# Patient Record
Sex: Female | Born: 1990 | Race: Black or African American | Hispanic: No | Marital: Single | Smoking: Current some day smoker
Health system: Southern US, Community
[De-identification: ages and names within clinical notes are randomized; demographics above are authoritative.]

## PROBLEM LIST (undated history)

## (undated) ENCOUNTER — Inpatient Hospital Stay (HOSPITAL_COMMUNITY): Payer: Self-pay

## (undated) ENCOUNTER — Emergency Department (HOSPITAL_COMMUNITY): Admission: EM | Discharge status: Absent without leave | Payer: Self-pay

## (undated) DIAGNOSIS — T8859XA Other complications of anesthesia, initial encounter: Secondary | ICD-10-CM

## (undated) DIAGNOSIS — F329 Major depressive disorder, single episode, unspecified: Secondary | ICD-10-CM

## (undated) DIAGNOSIS — Z9889 Other specified postprocedural states: Secondary | ICD-10-CM

## (undated) DIAGNOSIS — N200 Calculus of kidney: Secondary | ICD-10-CM

## (undated) DIAGNOSIS — F32A Depression, unspecified: Secondary | ICD-10-CM

## (undated) DIAGNOSIS — K219 Gastro-esophageal reflux disease without esophagitis: Secondary | ICD-10-CM

## (undated) DIAGNOSIS — R112 Nausea with vomiting, unspecified: Secondary | ICD-10-CM

## (undated) DIAGNOSIS — T4145XA Adverse effect of unspecified anesthetic, initial encounter: Secondary | ICD-10-CM

## (undated) DIAGNOSIS — I82629 Acute embolism and thrombosis of deep veins of unspecified upper extremity: Secondary | ICD-10-CM

## (undated) DIAGNOSIS — A599 Trichomoniasis, unspecified: Secondary | ICD-10-CM

## (undated) DIAGNOSIS — B999 Unspecified infectious disease: Secondary | ICD-10-CM

## (undated) HISTORY — PX: KNEE SURGERY: SHX244

## (undated) HISTORY — DX: Trichomoniasis, unspecified: A59.9

---
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
## 2010-04-14 ENCOUNTER — Encounter: Payer: Self-pay | Admitting: Obstetrics and Gynecology

## 2010-05-25 ENCOUNTER — Inpatient Hospital Stay (INDEPENDENT_AMBULATORY_CARE_PROVIDER_SITE_OTHER)
Admission: RE | Admit: 2010-05-25 | Discharge: 2010-05-25 | Disposition: A | Discharge status: Home / Self Care | Payer: BC Managed Care – PPO | Source: Ambulatory Visit | Attending: Family Medicine | Admitting: Family Medicine

## 2010-05-25 DIAGNOSIS — R599 Enlarged lymph nodes, unspecified: Secondary | ICD-10-CM

## 2010-06-06 LAB — CBC
HCT: 33.2 % — ABNORMAL LOW (ref 36.0–46.0)
Hemoglobin: 11.1 g/dL — ABNORMAL LOW (ref 12.0–15.0)
MCH: 30.5 pg (ref 26.0–34.0)
MCH: 31.7 pg (ref 26.0–34.0)
MCV: 92 fL (ref 78.0–100.0)
RBC: 3.52 MIL/uL — ABNORMAL LOW (ref 3.87–5.11)
RBC: 4.53 MIL/uL (ref 3.87–5.11)
RDW: 14.7 % (ref 11.5–15.5)
WBC: 18.7 10*3/uL — ABNORMAL HIGH (ref 4.0–10.5)

## 2010-06-09 LAB — POCT URINALYSIS DIP (DEVICE)
Glucose, UA: NEGATIVE mg/dL
Nitrite: NEGATIVE
pH: 7.5 (ref 5.0–8.0)

## 2010-06-09 LAB — POCT PREGNANCY, URINE: Preg Test, Ur: NEGATIVE

## 2010-06-10 LAB — URINALYSIS, ROUTINE W REFLEX MICROSCOPIC
Bilirubin Urine: NEGATIVE
Ketones, ur: NEGATIVE mg/dL
Leukocytes, UA: NEGATIVE
Nitrite: NEGATIVE
Specific Gravity, Urine: >1.03 — ABNORMAL HIGH (ref 1.005–1.030)
pH: 5.5 (ref 5.0–8.0)

## 2010-06-10 LAB — URINE MICROSCOPIC-ADD ON

## 2010-06-13 LAB — URINALYSIS, ROUTINE W REFLEX MICROSCOPIC
Bilirubin Urine: NEGATIVE
Glucose, UA: NEGATIVE mg/dL
Ketones, ur: 15 mg/dL — AB
Nitrite: NEGATIVE
Protein, ur: NEGATIVE mg/dL
Specific Gravity, Urine: >1.03 — ABNORMAL HIGH (ref 1.005–1.030)

## 2010-06-13 LAB — CBC
HCT: 36.8 % (ref 36.0–46.0)
Hemoglobin: 12.3 g/dL (ref 12.0–15.0)
MCV: 91.5 fL (ref 78.0–100.0)
Platelets: 291 10*3/uL (ref 150–400)
RBC: 4.02 MIL/uL (ref 3.87–5.11)
RDW: 13 % (ref 11.5–15.5)
WBC: 11.3 10*3/uL — ABNORMAL HIGH (ref 4.0–10.5)

## 2010-06-13 LAB — URINE MICROSCOPIC-ADD ON

## 2010-06-13 LAB — POCT PREGNANCY, URINE: Preg Test, Ur: POSITIVE

## 2010-06-13 LAB — WET PREP, GENITAL: Trich, Wet Prep: NONE SEEN

## 2010-06-13 LAB — GC/CHLAMYDIA PROBE AMP, GENITAL
Chlamydia, DNA Probe: NEGATIVE
GC Probe Amp, Genital: NEGATIVE

## 2010-08-09 NOTE — Discharge Summary (Signed)
Erin Jacobson, Erin Jacobson                  ACCOUNT NO.:  [id number]   MEDICAL RECORD NO.:  [id number]          PATIENT TYPE:  INP   LOCATION:  6153                         FACILITY:  [facility]   PHYSICIAN:  Prabhakar D. Pendse, M.D.DATE OF BIRTH:  02/12/1991   DATE OF ADMISSION:  04/01/2004  DATE OF DISCHARGE:  04/05/2004                                 DISCHARGE SUMMARY   REASON FOR HOSPITALIZATION:  Observation status post appendectomy on April 01, 2004.   SIGNIFICANT FINDINGS:  1.  CT showing acute appendicitis with free fluid in the abdomen.  2.  Culture of the appendix grew Citrobacter freundii which was sensitive to      ceftriaxone and other third generation cephalosporins, Gentamycin,      imipenem, Tobra, and Septra.  Resistant to Cefazolin and Cefotan.   TREATMENT:  Exploratory laparotomy and appendectomy on April 01, 2004.  Initially, broad-spectrum antibiotics were given to cover.  Antibiotic  therapy was __________2 g IV while inhouse once sensitivities and culture  came back.  The patient was kept n.p.o. until positive flatus.  He slowly  advanced to a diet as tolerated, and on discharge we switched over to p.o.  antibiotics.   OPERATION PERFORMED:  Exploratory laparotomy and appendectomy on April 01, 2004.   FINAL DIAGNOSIS:  Appendicitis.   DISCHARGE MEDICATIONS AND INSTRUCTIONS:  1.  Omnicef 300 mg p.o. b.i.d. x7 days.  2.  Tylenol 325 mg p.o. q.4-6h p.r.n. pain.  3.  Followup will be with Dr. Levie Heritage in one week and his family is to make      this appointment.   Discharge weight is 40.1 k.  Discharge condition is good.       SB/MEDQ  D:  04/05/2004  T:  04/05/2004  Job:  [id number]

## 2010-08-09 NOTE — Op Note (Signed)
NAMECLEMMA, JOHNSEN                  ACCOUNT NO.:  [id number]   MEDICAL RECORD NO.:  [id number]          PATIENT TYPE:  EMS   LOCATION:  ED                           FACILITY:  [facility]   PHYSICIAN:  Prabhakar D. Pendse, M.D.DATE OF BIRTH:  1990-06-04   DATE OF PROCEDURE:  04/01/2004  DATE OF DISCHARGE:  04/01/2004                                 OPERATIVE REPORT   PREOPERATIVE DIAGNOSIS:  Acute suppurative appendicitis with possible  perforation.   POSTOPERATIVE DIAGNOSIS:  Acute suppurative appendicitis with perforation.   OPERATION PERFORMED:  1.  Exploratory laparotomy.  2.  Appendectomy.   ASSISTANT:  Nurse.   ANESTHESIA:  Nurse.   OPERATIVE INDICATION:  This 20 year old girl was seen at [facility]  Emergency Room with about a 3-day history of progressively worse  intermittent, colicky, lower abdominal pain associated with nausea,  anorexia, vomiting, and low-grade fever.  No history of URI.  No dysuria.  Physical examination showed distended, tender, rigid abdomen suggestive of  acute abdomen with peritonitis.  CBC showed white count of 18,700 with 86%  neutrophils.  Urinalysis was unremarkable.  CT scan of the abdomen and  pelvis showed evidence of acute appendicitis with free fluid in the pelvic  cavity.  The diagnosis of acute appendicitis with possible perforation was  made and exploratory laparotomy was planned.  The patient received IV  ampicillin, gentamicin, and clindamycin.   OPERATIVE FINDINGS:  Upon opening the right lower quadrant area, exploration  revealed free fluid in the peritoneal cavity.  Further exploration showed  appendix to be about 3 inches long, markedly distended with gangrenous  changes and perforation.  There was foul-smelling, purulent material oozing  out of the appendicular area and collected in the pelvic cavity.  The  examination of the distal ileum showed congestion and some fibrinous  material coated over the distal ileum with distention  of the ileum.  There  was no evidence of acute ileitis.  There was no evidence of Meckel's  diverticulum.   OPERATIVE PROCEDURE:  Under satisfactory general endotracheal anesthesia and  with the patient in supine position, the abdomen was thoroughly prepped and  draped in the usual manner.  An about 2 inch long transverse incision was  made in the right lower quadrant area.  Skin and subcutaneous tissue  incised.  Bleeders were individually clamped, cut, and electrocoagulated.  Muscles incised in the McBurney fashion.  Peritoneal cavity entered.  Exploration revealed the findings as described above.  Appropriate  retractors were placed.  Cecum and appendix were exteriorized.  The  appendical mesentery was serially clamped, cut, and ligated with 2-0 silk.  Appendectomy was done.  The appendix was somewhat on the posterior aspect of  the cecum and could not be clearly exteriorized.  Hence, appendical stump  was not buried.  Instead, the appendical stump was ligated with 3-0 silk and  two large Hemoclips were applied.  The cecum and the terminal ileum were  returned to the peritoneal cavity.  The terminal ileum was examined for  evidence of ileitis or Meckel's diverticulum; none were seen.  Now, the  pelvic cavity was aspirated of all purulent material.  Irrigation was  carried out with copious amount of saline.  With the sponge and needle count  being correct, the peritoneum was closed with 2-0 Vicryl running,  interlocking sutures.  The wound was irrigated again.  Individual muscles  closed with 2-0 Vicryl interrupted sutures.  A small  Penrose drain was left in the depth of the wound and the wound was loosely  closed with 5-0 nylon.  Montgomery strap dressing applied.  Throughout the  procedure, the patient's vital signs remained stable.  The patient withstood  the procedure well and was transferred to the recovery room in satisfactory  general condition.      PDP/MEDQ  D:   04/01/2004  T:  04/01/2004  Job:  [id number]   cc:   Lorre Nick, M.D.  [address] [place], [place] [address]  Fax: [phone]   Lenda Kelp, M.D.  [facility] [address], [place], [place]

## 2010-08-11 ENCOUNTER — Inpatient Hospital Stay (INDEPENDENT_AMBULATORY_CARE_PROVIDER_SITE_OTHER)
Admission: RE | Admit: 2010-08-11 | Discharge: 2010-08-11 | Disposition: A | Discharge status: Home / Self Care | Payer: BC Managed Care – PPO | Source: Ambulatory Visit | Attending: Family Medicine | Admitting: Family Medicine

## 2010-08-11 DIAGNOSIS — R071 Chest pain on breathing: Secondary | ICD-10-CM

## 2011-03-05 ENCOUNTER — Encounter: Payer: Self-pay | Admitting: *Deleted

## 2011-03-05 ENCOUNTER — Emergency Department (INDEPENDENT_AMBULATORY_CARE_PROVIDER_SITE_OTHER)
Admission: EM | Admit: 2011-03-05 | Discharge: 2011-03-05 | Disposition: A | Discharge status: Home / Self Care | Payer: BC Managed Care – PPO | Source: Home / Self Care | Attending: Family Medicine | Admitting: Family Medicine

## 2011-03-05 DIAGNOSIS — N12 Tubulo-interstitial nephritis, not specified as acute or chronic: Secondary | ICD-10-CM

## 2011-03-05 LAB — POCT URINALYSIS DIP (DEVICE)
Urobilinogen, UA: 1 mg/dL (ref 0.0–1.0)
pH: 6 (ref 5.0–8.0)

## 2011-03-05 MED ORDER — IBUPROFEN 800 MG PO TABS
800.0000 mg | ORAL_TABLET | Freq: Once | ORAL | Status: AC
  Administered 2011-03-05: 800 mg via ORAL

## 2011-03-05 MED ORDER — CEFTRIAXONE SODIUM 1 G IJ SOLR
1.0000 g | Freq: Once | INTRAMUSCULAR | Status: AC
  Administered 2011-03-05: 1 g via INTRAMUSCULAR

## 2011-03-05 MED ORDER — IBUPROFEN 800 MG PO TABS
ORAL_TABLET | ORAL | Status: AC
  Filled 2011-03-05: qty 1

## 2011-03-05 MED ORDER — CEFTRIAXONE SODIUM 1 G IJ SOLR
INTRAMUSCULAR | Status: AC
  Filled 2011-03-05: qty 10

## 2011-03-05 MED ORDER — HYDROCODONE-ACETAMINOPHEN 5-500 MG PO TABS: 1.0000 | ORAL_TABLET | Freq: Four times a day (QID) | ORAL | Status: AC | PRN

## 2011-03-05 MED ORDER — CEPHALEXIN 500 MG PO CAPS: 500.0000 mg | ORAL_CAPSULE | Freq: Three times a day (TID) | ORAL | Status: AC

## 2011-03-05 MED ORDER — ACETAMINOPHEN 325 MG PO TABS
ORAL_TABLET | ORAL | Status: AC
  Filled 2011-03-05: qty 2

## 2011-03-05 MED ORDER — ACETAMINOPHEN 325 MG PO TABS
650.0000 mg | ORAL_TABLET | Freq: Once | ORAL | Status: AC
  Administered 2011-03-05: 650 mg via ORAL

## 2011-03-05 NOTE — ED Notes (Signed)
Pt  Has  Symptoms  Of  abd  Pain frequency   Of  Urination       With  Fever  And  Headache  As  Well the pt  Reports  The  Symptoms   X  2  Days       She  Reports  Body  Aches  As  Well -  She  denys  Any  Vaginal  Discharge  Or  Any  Bleeding at this  Time

## 2011-03-05 NOTE — ED Provider Notes (Signed)
History     CSN: [id number] Arrival date & time: 03/05/2011 12:26 PM   First MD Initiated Contact with Patient 03/05/11 1129      Chief Complaint  Patient presents with  . Fever    (Consider location/radiation/quality/duration/timing/severity/associated sxs/prior treatment) HPI Comments: 20y/o female no significant past medical history here c/o fever, urinary frequency and low abdominal pain with left flank irradiation for 2 days. LMP by end of October has not had period since, has had 2 negative pregnancy test at PCP office. Will Depo provera this month but has had unprotected sex in past several months. Denies vaginal discharge or vaginal bleeding. Feels headache and nausea, no vomiting, no breast tenderness.   History reviewed. No pertinent past medical history.  Past Surgical History  Procedure Date  . Appendectomy     History reviewed. No pertinent family history.  History  Substance Use Topics  . Smoking status: Current Some Day Smoker  . Smokeless tobacco: Not on file  . Alcohol Use: No    OB History    Grav Para Term Preterm Abortions TAB SAB Ect Mult Living                  Review of Systems  Constitutional: Positive for fever and chills.  Gastrointestinal: Positive for nausea and abdominal pain. Negative for vomiting.  Genitourinary: Positive for dysuria, frequency and flank pain. Negative for vaginal bleeding and vaginal discharge.  Skin: Negative for rash.  Neurological: Positive for headaches.    Allergies  Review of patient's allergies indicates no known allergies.  Home Medications   Current Outpatient Rx  Name Route Sig Dispense Refill  . CEPHALEXIN 500 MG PO CAPS Oral Take 1 capsule (500 mg total) by mouth 3 (three) times daily. 21 capsule 0  . HYDROCODONE-ACETAMINOPHEN 5-500 MG PO TABS Oral Take 1-2 tablets by mouth every 6 (six) hours as needed for pain. 15 tablet 0    BP 104/62  Pulse 110  Temp(Src) 102 F (38.9 C) (Oral)  Resp 22   SpO2 100%  LMP 01/05/2011  Physical Exam  Nursing note and vitals reviewed. Constitutional: She is oriented to person, place, and time. She appears well-developed and well-nourished.       Febrile laying in bed. Alert. Cooperative. tolearating fluids  Neck: Neck supple.  Cardiovascular: Regular rhythm and normal heart sounds.   No murmur heard.      Febrile tachycardia as expected.  Pulmonary/Chest: Breath sounds normal. No respiratory distress. She has no wheezes. She has no rales. She exhibits no tenderness.  Abdominal: Soft. She exhibits no distension and no mass. There is tenderness. There is no rebound and no guarding.       Suprapubic tenderness and left flank tenderness . No CVT.  Lymphadenopathy:    She has no cervical adenopathy.  Neurological: She is alert and oriented to person, place, and time.  Skin: Skin is warm. No rash noted.    ED Course  Procedures (including critical care time)  Labs Reviewed  POCT URINALYSIS DIP (DEVICE) - Abnormal; Notable for the following:    Bilirubin Urine SMALL (*)    Ketones, ur 40 (*)    Hgb urine dipstick SMALL (*)    Protein, ur 30 (*)    Leukocytes, UA SMALL (*) Biochemical Testing Only. Please order routine urinalysis from main lab if confirmatory testing is needed.   All other components within normal limits  POCT PREGNANCY, URINE  URINE CULTURE   No results found.  1. Pyelonephritis       MDM  Despite no vomiting and no CVT concerned for possible early pyelo. tolerating po well. Had administered Rocephin 1g am. Negative pregnancy test but amenorrhea and unprotected sex treated orally with keflex 500mg  tid. Asked to return tomorrow if persistent fever or worsening symptoms; otherwise will return Friday evening (48 hours) for follow up here.        Sharin Grave, MD 03/06/11 2140

## 2011-03-07 ENCOUNTER — Emergency Department (INDEPENDENT_AMBULATORY_CARE_PROVIDER_SITE_OTHER)
Admission: EM | Admit: 2011-03-07 | Discharge: 2011-03-07 | Disposition: A | Discharge status: Nursing Home (Includes ICF) | Payer: BC Managed Care – PPO | Source: Home / Self Care | Attending: Family Medicine | Admitting: Family Medicine

## 2011-03-07 ENCOUNTER — Encounter (HOSPITAL_COMMUNITY): Payer: Self-pay | Admitting: Emergency Medicine

## 2011-03-07 ENCOUNTER — Emergency Department (HOSPITAL_COMMUNITY): Payer: BC Managed Care – PPO

## 2011-03-07 ENCOUNTER — Emergency Department (HOSPITAL_COMMUNITY)
Admission: EM | Admit: 2011-03-07 | Discharge: 2011-03-08 | Disposition: A | Discharge status: Home / Self Care | Payer: BC Managed Care – PPO | Attending: Emergency Medicine | Admitting: Emergency Medicine

## 2011-03-07 ENCOUNTER — Encounter (HOSPITAL_COMMUNITY): Payer: Self-pay

## 2011-03-07 DIAGNOSIS — R509 Fever, unspecified: Secondary | ICD-10-CM

## 2011-03-07 DIAGNOSIS — R109 Unspecified abdominal pain: Secondary | ICD-10-CM | POA: Insufficient documentation

## 2011-03-07 DIAGNOSIS — F17200 Nicotine dependence, unspecified, uncomplicated: Secondary | ICD-10-CM | POA: Insufficient documentation

## 2011-03-07 DIAGNOSIS — A5901 Trichomonal vulvovaginitis: Secondary | ICD-10-CM

## 2011-03-07 DIAGNOSIS — N949 Unspecified condition associated with female genital organs and menstrual cycle: Secondary | ICD-10-CM | POA: Insufficient documentation

## 2011-03-07 DIAGNOSIS — R110 Nausea: Secondary | ICD-10-CM | POA: Insufficient documentation

## 2011-03-07 DIAGNOSIS — Z79899 Other long term (current) drug therapy: Secondary | ICD-10-CM | POA: Insufficient documentation

## 2011-03-07 DIAGNOSIS — N739 Female pelvic inflammatory disease, unspecified: Secondary | ICD-10-CM

## 2011-03-07 DIAGNOSIS — R10819 Abdominal tenderness, unspecified site: Secondary | ICD-10-CM | POA: Insufficient documentation

## 2011-03-07 LAB — URINE CULTURE: Colony Count: >=100000

## 2011-03-07 LAB — CBC
HCT: 34.2 % — ABNORMAL LOW (ref 36.0–46.0)
Hemoglobin: 11.7 g/dL — ABNORMAL LOW (ref 12.0–15.0)
MCH: 29.3 pg (ref 26.0–34.0)
MCHC: 34.2 g/dL (ref 30.0–36.0)
MCV: 85.7 fL (ref 78.0–100.0)
Platelets: 247 K/uL (ref 150–400)
RBC: 3.99 MIL/uL (ref 3.87–5.11)
RDW: 13.4 % (ref 11.5–15.5)
WBC: 14.3 K/uL — ABNORMAL HIGH (ref 4.0–10.5)

## 2011-03-07 LAB — COMPREHENSIVE METABOLIC PANEL WITH GFR
ALT: 16 U/L (ref 0–35)
AST: 16 U/L (ref 0–37)
Albumin: 3.4 g/dL — ABNORMAL LOW (ref 3.5–5.2)
Alkaline Phosphatase: 76 U/L (ref 39–117)
BUN: 5 mg/dL — ABNORMAL LOW (ref 6–23)
CO2: 24 meq/L (ref 19–32)
Calcium: 9 mg/dL (ref 8.4–10.5)
Chloride: 101 meq/L (ref 96–112)
Creatinine, Ser: 0.82 mg/dL (ref 0.50–1.10)
GFR calc Af Amer: >90 mL/min
GFR calc non Af Amer: >90 mL/min
Glucose, Bld: 90 mg/dL (ref 70–99)
Potassium: 3.4 meq/L — ABNORMAL LOW (ref 3.5–5.1)
Sodium: 137 meq/L (ref 135–145)
Total Bilirubin: 0.3 mg/dL (ref 0.3–1.2)
Total Protein: 7.3 g/dL (ref 6.0–8.3)

## 2011-03-07 LAB — WET PREP, GENITAL: Clue Cells Wet Prep HPF POC: NONE SEEN

## 2011-03-07 LAB — URINALYSIS, ROUTINE W REFLEX MICROSCOPIC
Bilirubin Urine: NEGATIVE
Glucose, UA: NEGATIVE mg/dL
Specific Gravity, Urine: 1.008 (ref 1.005–1.030)
Urobilinogen, UA: 1 mg/dL (ref 0.0–1.0)

## 2011-03-07 LAB — LIPASE, BLOOD: Lipase: 11 U/L (ref 11–59)

## 2011-03-07 LAB — DIFFERENTIAL
Basophils Absolute: 0 10*3/uL (ref 0.0–0.1)
Eosinophils Relative: 3 % (ref 0–5)
Lymphocytes Relative: 15 % (ref 12–46)
Lymphs Abs: 2.2 10*3/uL (ref 0.7–4.0)
Neutrophils Relative %: 72 % (ref 43–77)

## 2011-03-07 LAB — URINE MICROSCOPIC-ADD ON

## 2011-03-07 LAB — PREGNANCY, URINE: Preg Test, Ur: NEGATIVE

## 2011-03-07 MED ORDER — CEFTRIAXONE SODIUM 250 MG IJ SOLR
250.0000 mg | Freq: Once | INTRAMUSCULAR | Status: AC
  Administered 2011-03-08: 250 mg via INTRAMUSCULAR
  Filled 2011-03-07: qty 250

## 2011-03-07 MED ORDER — AZITHROMYCIN 250 MG PO TABS
1000.0000 mg | ORAL_TABLET | Freq: Once | ORAL | Status: AC
  Administered 2011-03-08: 1000 mg via ORAL
  Filled 2011-03-07: qty 4

## 2011-03-07 MED ORDER — ONDANSETRON HCL 4 MG/2ML IJ SOLN
4.0000 mg | Freq: Once | INTRAMUSCULAR | Status: AC
  Administered 2011-03-07: 4 mg via INTRAVENOUS
  Filled 2011-03-07: qty 2

## 2011-03-07 MED ORDER — METRONIDAZOLE 500 MG PO TABS
2000.0000 mg | ORAL_TABLET | Freq: Once | ORAL | Status: AC
  Administered 2011-03-08: 2000 mg via ORAL
  Filled 2011-03-07: qty 4

## 2011-03-07 MED ORDER — ACETAMINOPHEN 500 MG PO TABS
1000.0000 mg | ORAL_TABLET | Freq: Once | ORAL | Status: AC
  Administered 2011-03-08: 1000 mg via ORAL
  Filled 2011-03-07: qty 2

## 2011-03-07 MED ORDER — SODIUM CHLORIDE 0.9 % IV BOLUS (SEPSIS)
1000.0000 mL | Freq: Once | INTRAVENOUS | Status: AC
  Administered 2011-03-08: 1000 mL via INTRAVENOUS

## 2011-03-07 MED ORDER — MORPHINE SULFATE 4 MG/ML IJ SOLN: 4.0000 mg | Freq: Once | INTRAMUSCULAR | Status: DC

## 2011-03-07 MED ORDER — SODIUM CHLORIDE 0.9 % IV BOLUS (SEPSIS)
1000.0000 mL | Freq: Once | INTRAVENOUS | Status: AC
  Administered 2011-03-07: 1000 mL via INTRAVENOUS

## 2011-03-07 NOTE — ED Notes (Signed)
Received bedside report from [place], [place].  Patient currently in ultrasound.  Will continue to monitor.

## 2011-03-07 NOTE — ED Provider Notes (Signed)
History     CSN: [id number] Arrival date & time: 03/07/2011  7:04 PM   First MD Initiated Contact with Patient 03/07/11 1806      No chief complaint on file.   (Consider location/radiation/quality/duration/timing/severity/associated sxs/prior treatment) HPI Comments: Erin Jacobson presents for follow up evaluation of fever, abdominal pain, headaches. She was seen here two days ago for suspected pyelonephritis; was given 1 gm Rocephin and rx for Keflex 500 mg TID, and told to return today for re-evaluation. She reports no improvement in her symptoms. She is still febrile, unable to eat, has no appetite. She also reports LEFT lower quadrant abdominal pain, 7-8/10. She reports persistent nausea but no vomiting.  Patient is a 20 y.o. female presenting with fever. The history is provided by the patient.  Fever Primary symptoms of the febrile illness include fever, fatigue, abdominal pain, nausea, vomiting and myalgias. Primary symptoms do not include dysuria. The current episode started 3 to 5 days ago. This is a new problem. The problem has not changed since onset. The fever began 3 to 5 days ago. The fever has been unchanged since its onset. The maximum temperature recorded prior to her arrival was unknown.  The abdominal pain began more than 2 days ago. The abdominal pain has been unchanged since its onset. The abdominal pain is located in the LLQ. The abdominal pain does not radiate. The severity of the abdominal pain is 8/10.  Nausea began 3 to 5 days ago. The nausea is associated with eating.  The vomiting began yesterday. Vomiting occurred once.    No past medical history on file.  Past Surgical History  Procedure Date  . Appendectomy     No family history on file.  History  Substance Use Topics  . Smoking status: Current Some Day Smoker  . Smokeless tobacco: Not on file  . Alcohol Use: No    OB History    Grav Para Term Preterm Abortions TAB SAB Ect Mult Living                   Review of Systems  Constitutional: Positive for fever, chills and fatigue.  HENT: Negative.   Eyes: Negative.   Respiratory: Negative.   Gastrointestinal: Positive for nausea, vomiting and abdominal pain.  Genitourinary: Positive for vaginal discharge and pelvic pain. Negative for dysuria, flank pain, vaginal bleeding and difficulty urinating.  Musculoskeletal: Positive for myalgias.  Skin: Negative.     Allergies  Review of patient's allergies indicates no known allergies.  Home Medications   Current Outpatient Rx  Name Route Sig Dispense Refill  . CEPHALEXIN 500 MG PO CAPS Oral Take 1 capsule (500 mg total) by mouth 3 (three) times daily. 21 capsule 0  . HYDROCODONE-ACETAMINOPHEN 5-500 MG PO TABS Oral Take 1-2 tablets by mouth every 6 (six) hours as needed for pain. 15 tablet 0    BP 95/47  Pulse 107  Temp(Src) 100.3 F (37.9 C) (Oral)  Resp 20  SpO2 100%  LMP 01/05/2011  Physical Exam  Nursing note and vitals reviewed. Constitutional: She is oriented to person, place, and time. She appears well-developed and well-nourished.  HENT:  Head: Normocephalic and atraumatic.  Eyes: EOM are normal.  Neck: Normal range of motion.  Cardiovascular: Regular rhythm.  Tachycardia present.   Pulmonary/Chest: Effort normal.  Abdominal: Soft. Normal appearance and bowel sounds are normal. There is tenderness in the left lower quadrant. There is no rebound.  Musculoskeletal: Normal range of motion.  Neurological: She is alert  and oriented to person, place, and time.  Skin: Skin is warm and dry.  Psychiatric: Her behavior is normal.    ED Course  Procedures (including critical care time)  Labs Reviewed - No data to display No results found.   No diagnosis found.    MDM  Transferred to [facility] ED        Richardo Priest, MD 03/07/11 [phone]

## 2011-03-07 NOTE — ED Provider Notes (Signed)
History     CSN: [id number] Arrival date & time: 03/07/2011  8:23 PM   First MD Initiated Contact with Patient 03/07/11 2125      Chief Complaint  Patient presents with  . Abdominal Pain   HPI Patient presents to the emergency department with diffuse abdominal pain worse in the LLQ/RLQ. Patient reports vaginal discharge. Patient states that she has a history of STD. Patient reports that she has had one episode of vomiting yesterday. Patient reports that she has recently been started on keflex for urinary tract infection. Denies any dysuria.   History reviewed. No pertinent past medical history.  Past Surgical History  Procedure Date  . Appendectomy     No family history on file.  History  Substance Use Topics  . Smoking status: Current Some Day Smoker  . Smokeless tobacco: Not on file  . Alcohol Use: No    OB History    Grav Para Term Preterm Abortions TAB SAB Ect Mult Living                  Review of Systems  Constitutional: Negative for fever, chills, diaphoresis and appetite change.  HENT: Negative for neck pain.   Eyes: Negative for photophobia and visual disturbance.  Respiratory: Negative for cough, chest tightness and shortness of breath.   Cardiovascular: Negative for chest pain.  Gastrointestinal: Positive for nausea and abdominal pain. Negative for diarrhea, constipation, blood in stool, abdominal distention, anal bleeding and rectal pain.  Genitourinary: Negative for dysuria, flank pain and difficulty urinating.  Musculoskeletal: Negative for back pain.  Skin: Negative for rash.  Neurological: Negative for weakness and numbness.  All other systems reviewed and are negative.    Allergies  Review of patient's allergies indicates no known allergies.  Home Medications   Current Outpatient Rx  Name Route Sig Dispense Refill  . CEPHALEXIN 500 MG PO CAPS Oral Take 1 capsule (500 mg total) by mouth 3 (three) times daily. 21 capsule 0  .  HYDROCODONE-ACETAMINOPHEN 5-500 MG PO TABS Oral Take 1-2 tablets by mouth every 6 (six) hours as needed for pain. 15 tablet 0    BP 110/70  Pulse 108  Temp(Src) 99.1 F (37.3 C) (Oral)  Resp 22  SpO2 99%  LMP 01/05/2011  Physical Exam  Nursing note and vitals reviewed. Constitutional: She is oriented to person, place, and time. She appears well-developed and well-nourished.  Non-toxic appearance. No distress.       Vital signs stable  HENT:  Head: Normocephalic and atraumatic.  Mouth/Throat: Oropharynx is clear and moist.  Eyes: EOM are normal. Pupils are equal, round, and reactive to light.  Neck: Normal range of motion. Neck supple.  Cardiovascular: Normal rate, regular rhythm, normal heart sounds and intact distal pulses.  Exam reveals no gallop and no friction rub.   No murmur heard. Pulmonary/Chest: Effort normal and breath sounds normal. No respiratory distress. She has no wheezes.  Abdominal: Soft. Bowel sounds are normal. She exhibits no distension and no mass. There is tenderness. There is no rebound and no guarding.       Diffuse tenderness  Genitourinary: Uterus normal. Vaginal discharge found.       Green/yellow vaginal discharge, copious amounts. Foul odor. Cervical motion tenderness. Left adnexa tenderness. Chaperone present during examination.   Musculoskeletal: Normal range of motion. She exhibits no edema and no tenderness.  Neurological: She is alert and oriented to person, place, and time. She exhibits normal muscle tone.  Skin: Skin is  warm and dry. No rash noted. She is not diaphoretic.  Psychiatric: She has a normal mood and affect. Her behavior is normal. Judgment and thought content normal.    ED Course  Procedures (including critical care time)  Patient seen and evaluated.  VSS reviewed. . Nursing notes reviewed. Discussed with attending physician, dr. Manus Gunning. Initial testing ordered. Will monitor the patient closely. They agree with the treatment plan  and diagnosis.   Results for orders placed during the hospital encounter of 03/07/11  CBC      Component Value Range   WBC 14.3 (*) 4.0 - 10.5 (K/uL)   RBC 3.99  3.87 - 5.11 (MIL/uL)   Hemoglobin 11.7 (*) 12.0 - 15.0 (g/dL)   HCT 40.9 (*) 81.1 - 46.0 (%)   MCV 85.7  78.0 - 100.0 (fL)   MCH 29.3  26.0 - 34.0 (pg)   MCHC 34.2  30.0 - 36.0 (g/dL)   RDW 91.4  78.2 - 95.6 (%)   Platelets 247  150 - 400 (K/uL)  DIFFERENTIAL      Component Value Range   Neutrophils Relative 72  43 - 77 (%)   Neutro Abs 10.3 (*) 1.7 - 7.7 (K/uL)   Lymphocytes Relative 15  12 - 46 (%)   Lymphs Abs 2.2  0.7 - 4.0 (K/uL)   Monocytes Relative 10  3 - 12 (%)   Monocytes Absolute 1.4 (*) 0.1 - 1.0 (K/uL)   Eosinophils Relative 3  0 - 5 (%)   Eosinophils Absolute 0.4  0.0 - 0.7 (K/uL)   Basophils Relative 0  0 - 1 (%)   Basophils Absolute 0.0  0.0 - 0.1 (K/uL)  URINALYSIS, ROUTINE W REFLEX MICROSCOPIC      Component Value Range   Color, Urine YELLOW  YELLOW    APPearance CLOUDY (*) CLEAR    Specific Gravity, Urine 1.008  1.005 - 1.030    pH 6.0  5.0 - 8.0    Glucose, UA NEGATIVE  NEGATIVE (mg/dL)   Hgb urine dipstick LARGE (*) NEGATIVE    Bilirubin Urine NEGATIVE  NEGATIVE    Ketones, ur 15 (*) NEGATIVE (mg/dL)   Protein, ur 30 (*) NEGATIVE (mg/dL)   Urobilinogen, UA 1.0  0.0 - 1.0 (mg/dL)   Nitrite NEGATIVE  NEGATIVE    Leukocytes, UA LARGE (*) NEGATIVE   PREGNANCY, URINE      Component Value Range   Preg Test, Ur NEGATIVE    URINE MICROSCOPIC-ADD ON      Component Value Range   Squamous Epithelial / LPF MANY (*) RARE    WBC, UA 21-50  <3 (WBC/hpf)   RBC / HPF 0-2  <3 (RBC/hpf)   Bacteria, UA FEW (*) RARE    Urine-Other TRICHOMONAS PRESENT     No results found.  Patient seen and re-evaluated. Resting comfortably. VSS stable. NAD. Patient notified of testing results. Stated agreement and understanding. Patient stated understanding to treatment plan and diagnosis. Advised patient to continue her  keflex antibiotics.   12:35 AM spoke to radiologist, who stated that the patient does not have a TOA or torsion. Patient does not have any acute findings, recommended follow up ultrasound for a subtle hypervascular area on her left ovary. No masses, no hydrosalpinx. Will treat patient for PID. Patient treatment with zithromax, rocephin, flagyl in the ED. Advised patient of warning signs to return. Stated agreement and understanding. Last normal bowel movement was yesterday, no constipation.   12:38 AM pain free on re-examination.  MDM  Abdominal pain Vaginal discharge PID Lolita Lenz, PA 03/08/11 [phone]

## 2011-03-07 NOTE — ED Notes (Signed)
Patient transported to Ultrasound 

## 2011-03-07 NOTE — ED Notes (Signed)
Labs and medications for 12/12 reviewed. Pt. adequately treated with Rocephin IM and Rx. Keflex for E. Coli on Urine culture. Erin Jacobson 03/07/2011

## 2011-03-07 NOTE — ED Notes (Signed)
Patient here for recheck of symptoms ; c/o she is continuing to have fever, body aches from prior visit; c/o pain worse at times than others

## 2011-03-07 NOTE — ED Notes (Signed)
PT. TRANSFERRED FROM [facility] FOR FURTHER EVALUATION OF MID ABDOMINAL PAIN / LOW BACK PAIN WITH FEVER AND HEADACHE - SEEN HERE 2 DAYS AGO RECEIVED ROCEPHIN 1GM IV AND PRESCRIPTION FOR KEFLEX  FOR PYLELONEPHRITIS WITH NO IMPROVEMENT.  NAUSEA .

## 2011-03-08 MED ORDER — LIDOCAINE HCL (PF) 1 % IJ SOLN
INTRAMUSCULAR | Status: AC
  Administered 2011-03-08: 1 mL
  Filled 2011-03-08: qty 5

## 2011-03-08 MED ORDER — PROMETHAZINE HCL 25 MG PO TABS: 25.0000 mg | ORAL_TABLET | Freq: Four times a day (QID) | ORAL | Status: DC | PRN

## 2011-03-08 MED ORDER — DOXYCYCLINE HYCLATE 100 MG PO CAPS: 100.0000 mg | ORAL_CAPSULE | Freq: Two times a day (BID) | ORAL | Status: AC

## 2011-03-08 NOTE — ED Notes (Signed)
Patient back from ultrasound; currently sitting up in bed; no respiratory or acute distress noted.  PA currently at bedside.  Will continue to monitor.

## 2011-03-08 NOTE — ED Provider Notes (Signed)
Medical screening examination/treatment/procedure(s) were performed by non-physician practitioner and as supervising physician I was immediately available for consultation/collaboration.   Glynn Octave, MD 03/08/11 [phone]

## 2011-03-08 NOTE — ED Notes (Signed)
Patient given discharge paperwork; went over discharge instructions with patient.  Patient instructed to take prescriptions as directed, to follow up with GYN and primary care physician, to have a repeat x-ray after finishing antibiotics, and to return to the ED for new, worsening, or concerning symptoms.

## 2011-03-09 LAB — URINE CULTURE
Colony Count: NO GROWTH
Culture  Setup Time: 201212142232
Culture: NO GROWTH

## 2011-03-11 NOTE — ED Notes (Signed)
Urine culture and medications for 12/12 reviewed.  Pt. adequately treated for E. Coli with Keflex. Vassie Moselle 03/11/2011

## 2011-12-25 ENCOUNTER — Other Ambulatory Visit: Payer: Self-pay | Admitting: Orthopedic Surgery

## 2012-01-02 ENCOUNTER — Encounter (HOSPITAL_BASED_OUTPATIENT_CLINIC_OR_DEPARTMENT_OTHER): Admission: RE | Payer: Self-pay | Source: Ambulatory Visit

## 2012-01-02 ENCOUNTER — Ambulatory Visit (HOSPITAL_BASED_OUTPATIENT_CLINIC_OR_DEPARTMENT_OTHER)
Admission: RE | Admit: 2012-01-02 | Payer: BC Managed Care – PPO | Source: Ambulatory Visit | Admitting: Orthopedic Surgery

## 2012-01-02 SURGERY — ARTHROSCOPY, KNEE
Anesthesia: General | Laterality: Left

## 2012-01-30 ENCOUNTER — Encounter (HOSPITAL_COMMUNITY): Payer: Self-pay | Admitting: Emergency Medicine

## 2012-01-30 ENCOUNTER — Emergency Department (HOSPITAL_COMMUNITY)
Admission: EM | Admit: 2012-01-30 | Discharge: 2012-01-30 | Disposition: A | Discharge status: Home / Self Care | Payer: BC Managed Care – PPO | Attending: Emergency Medicine | Admitting: Emergency Medicine

## 2012-01-30 ENCOUNTER — Emergency Department (HOSPITAL_COMMUNITY): Payer: BC Managed Care – PPO

## 2012-01-30 DIAGNOSIS — Y9389 Activity, other specified: Secondary | ICD-10-CM | POA: Insufficient documentation

## 2012-01-30 DIAGNOSIS — Y92410 Unspecified street and highway as the place of occurrence of the external cause: Secondary | ICD-10-CM | POA: Insufficient documentation

## 2012-01-30 DIAGNOSIS — M791 Myalgia, unspecified site: Secondary | ICD-10-CM

## 2012-01-30 DIAGNOSIS — F17200 Nicotine dependence, unspecified, uncomplicated: Secondary | ICD-10-CM | POA: Insufficient documentation

## 2012-01-30 DIAGNOSIS — S46909A Unspecified injury of unspecified muscle, fascia and tendon at shoulder and upper arm level, unspecified arm, initial encounter: Secondary | ICD-10-CM | POA: Insufficient documentation

## 2012-01-30 DIAGNOSIS — S4980XA Other specified injuries of shoulder and upper arm, unspecified arm, initial encounter: Secondary | ICD-10-CM | POA: Insufficient documentation

## 2012-01-30 DIAGNOSIS — IMO0001 Reserved for inherently not codable concepts without codable children: Secondary | ICD-10-CM | POA: Insufficient documentation

## 2012-01-30 MED ORDER — IBUPROFEN 400 MG PO TABS
600.0000 mg | ORAL_TABLET | Freq: Once | ORAL | Status: AC
  Administered 2012-01-30: 600 mg via ORAL
  Filled 2012-01-30: qty 3

## 2012-01-30 NOTE — ED Notes (Signed)
Mom was ambulatory at the scene, and is alert and oriented to date time place and person
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
## 2012-04-30 ENCOUNTER — Encounter (HOSPITAL_BASED_OUTPATIENT_CLINIC_OR_DEPARTMENT_OTHER): Payer: Self-pay | Admitting: *Deleted

## 2012-04-30 NOTE — Pre-Procedure Instructions (Signed)
Check LMP DOS, mother is unsure.

## 2012-05-07 ENCOUNTER — Ambulatory Visit (HOSPITAL_BASED_OUTPATIENT_CLINIC_OR_DEPARTMENT_OTHER)
Admission: RE | Admit: 2012-05-07 | Payer: BC Managed Care – PPO | Source: Ambulatory Visit | Admitting: Orthopedic Surgery

## 2012-05-07 SURGERY — ARTHROSCOPY, KNEE
Anesthesia: General | Site: Knee | Laterality: Left

## 2013-09-28 ENCOUNTER — Encounter (HOSPITAL_COMMUNITY): Payer: Self-pay | Admitting: Emergency Medicine

## 2013-09-28 ENCOUNTER — Emergency Department (HOSPITAL_COMMUNITY)
Admission: EM | Admit: 2013-09-28 | Discharge: 2013-09-28 | Disposition: A | Discharge status: Home / Self Care | Payer: BC Managed Care – PPO | Source: Home / Self Care | Attending: Family Medicine | Admitting: Family Medicine

## 2013-09-28 ENCOUNTER — Other Ambulatory Visit (HOSPITAL_COMMUNITY)
Admission: RE | Admit: 2013-09-28 | Discharge: 2013-09-28 | Disposition: A | Discharge status: Home / Self Care | Payer: BC Managed Care – PPO | Source: Ambulatory Visit | Attending: Family Medicine | Admitting: Family Medicine

## 2013-09-28 DIAGNOSIS — N760 Acute vaginitis: Secondary | ICD-10-CM

## 2013-09-28 DIAGNOSIS — N390 Urinary tract infection, site not specified: Secondary | ICD-10-CM

## 2013-09-28 DIAGNOSIS — Z113 Encounter for screening for infections with a predominantly sexual mode of transmission: Secondary | ICD-10-CM | POA: Insufficient documentation

## 2013-09-28 LAB — POCT URINALYSIS DIP (DEVICE)
BILIRUBIN URINE: NEGATIVE
GLUCOSE, UA: NEGATIVE mg/dL
KETONES UR: NEGATIVE mg/dL
NITRITE: NEGATIVE
PH: 6 (ref 5.0–8.0)
Protein, ur: 30 mg/dL — AB
Specific Gravity, Urine: 1.02 (ref 1.005–1.030)
Urobilinogen, UA: 0.2 mg/dL (ref 0.0–1.0)

## 2013-09-28 LAB — POCT PREGNANCY, URINE: Preg Test, Ur: NEGATIVE

## 2013-09-28 MED ORDER — NITROFURANTOIN MONOHYD MACRO 100 MG PO CAPS: 100.0000 mg | ORAL_CAPSULE | Freq: Two times a day (BID) | ORAL | Status: DC

## 2013-09-28 MED ORDER — FLUCONAZOLE 150 MG PO TABS: 150.0000 mg | ORAL_TABLET | Freq: Once | ORAL | Status: DC

## 2013-09-28 NOTE — ED Provider Notes (Signed)
CSN: [id number]     Arrival date & time 09/28/13  1439 History   First MD Initiated Contact with Patient 09/28/13 1540     Chief Complaint  Patient presents with  . Urinary Tract Infection   (Consider location/radiation/quality/duration/timing/severity/associated sxs/prior Treatment) HPI Comments: Patient reports "tingling" sensation in her vaginal area x 2-3 days with associated urinary frequency. Reports some concern regarding STIs as she states she had unprotected intercourse with new partner about one week ago.  Patient is a 23 y.o. female presenting with urinary tract infection. The history is provided by the patient.  Urinary Tract Infection    Past Medical History  Diagnosis Date  . Lateral meniscus tear 04/2012    left  . Meniscal cyst 04/2012    left knee   Past Surgical History  Procedure Laterality Date  . Appendectomy  04/01/2004    ex. lap.  . Knee surgery     Family History  Problem Relation Age of Onset  . Anesthesia problems Mother     post-op N/V, hard to wake up   History  Substance Use Topics  . Smoking status: Never Smoker   . Smokeless tobacco: Never Used  . Alcohol Use: Yes   OB History   Grav Para Term Preterm Abortions TAB SAB Ect Mult Living                 Review of Systems  Constitutional: Negative for fever, chills, appetite change and fatigue.  HENT: Negative.   Eyes: Negative.   Respiratory: Negative.   Cardiovascular: Negative.   Gastrointestinal: Negative.   Endocrine: Negative for polydipsia, polyphagia and polyuria.  Genitourinary: Positive for frequency. Negative for dysuria, urgency, hematuria, flank pain, decreased urine volume, vaginal bleeding, vaginal discharge, enuresis, difficulty urinating, genital sores, vaginal pain, menstrual problem, pelvic pain and dyspareunia.  Musculoskeletal: Negative for back pain.  Skin: Negative.   Allergic/Immunologic: Negative for immunocompromised state.  Neurological: Negative.      Allergies  Review of patient's allergies indicates no known allergies.  Home Medications   Prior to Admission medications   Medication Sig Start Date End Date Taking? Authorizing Provider  fluconazole (DIFLUCAN) 150 MG tablet Take 1 tablet (150 mg total) by mouth once. 09/28/13   Ardis RowanJennifer Lee Qasim Diveley, PA  ibuprofen (ADVIL,MOTRIN) 800 MG tablet Take 800 mg by mouth every 8 (eight) hours as needed.    Historical Provider, MD  nitrofurantoin, macrocrystal-monohydrate, (MACROBID) 100 MG capsule Take 1 capsule (100 mg total) by mouth 2 (two) times daily. 09/28/13   Jess BartersJennifer Lee Dru Laurel, PA   BP 132/86  Pulse 88  Temp(Src) 98.3 F (36.8 C) (Oral)  Resp 16  SpO2 98%  LMP 09/12/2013 Physical Exam  Nursing note and vitals reviewed. Constitutional: She is oriented to person, place, and time. She appears well-developed and well-nourished. No distress.  HENT:  Head: Normocephalic and atraumatic.  Eyes: Conjunctivae are normal.  Cardiovascular: Normal rate, regular rhythm and normal heart sounds.   Pulmonary/Chest: Effort normal and breath sounds normal.  Abdominal: Soft. Normal appearance and bowel sounds are normal. She exhibits no distension. There is no tenderness. There is no CVA tenderness.  Genitourinary: Uterus normal. Pelvic exam was performed with patient supine. There is no rash, tenderness or lesion on the right labia. There is no rash, tenderness or lesion on the left labia. Cervix exhibits discharge. Cervix exhibits no motion tenderness and no friability. Right adnexum displays no mass, no tenderness and no fullness. Left adnexum displays no mass, no tenderness  and no fullness. There is tenderness around the vagina. No erythema or bleeding around the vagina. No foreign body around the vagina. No signs of injury around the vagina. Vaginal discharge found.  Thick white vaginal discharge  Musculoskeletal: Normal range of motion.  Neurological: She is alert and oriented to person, place,  and time.  Skin: Skin is warm and dry. No rash noted. No erythema.  Psychiatric: She has a normal mood and affect. Her behavior is normal.    ED Course  Procedures (including critical care time) Labs Review Labs Reviewed  POCT URINALYSIS DIP (DEVICE) - Abnormal; Notable for the following:    Hgb urine dipstick MODERATE (*)    Protein, ur 30 (*)    Leukocytes, UA SMALL (*)    All other components within normal limits  URINE CULTURE  POCT PREGNANCY, URINE  CERVICOVAGINAL ANCILLARY ONLY    Imaging Review No results found.   MDM   1. Vaginitis   2. UTI (lower urinary tract infection)    UA suggestive of UTI. Will treat with Macrobid and send specimen for C&S. UPT negative Pelvic exam suggestive of yeast vaginitis. Will treat with Diflucan Patient declined empiric treatment for gonorrhea and chlamydia. States she only wishes to be treated if lab results are positive. Advised her she would be notified by phone of lab results if positive.    Jess BartersJennifer Lee AmestiPresson, [place] 09/28/13 [phone]

## 2013-09-28 NOTE — ED Provider Notes (Signed)
Medical screening examination/treatment/procedure(s) were performed by resident physician or non-physician practitioner and as supervising physician I was immediately available for consultation/collaboration.   KINDL,JAMES DOUGLAS MD.   James D Kindl, MD 09/28/13 2100 

## 2013-09-28 NOTE — Discharge Instructions (Signed)
Urinary Tract Infection Urinary tract infections (UTIs) can develop anywhere along your urinary tract. Your urinary tract is your body's drainage system for removing wastes and extra water. Your urinary tract includes two kidneys, two ureters, a bladder, and a urethra. Your kidneys are a pair of bean-shaped organs. Each kidney is about the size of your fist. They are located below your ribs, one on each side of your spine. CAUSES Infections are caused by microbes, which are microscopic organisms, including fungi, viruses, and bacteria. These organisms are so small that they can only be seen through a microscope. Bacteria are the microbes that most commonly cause UTIs. SYMPTOMS  Symptoms of UTIs may vary by age and gender of the patient and by the location of the infection. Symptoms in young women typically include a frequent and intense urge to urinate and a painful, burning feeling in the bladder or urethra during urination. Older women and men are more likely to be tired, shaky, and weak and have muscle aches and abdominal pain. A fever may mean the infection is in your kidneys. Other symptoms of a kidney infection include pain in your back or sides below the ribs, nausea, and vomiting. DIAGNOSIS To diagnose a UTI, your caregiver will ask you about your symptoms. Your caregiver also will ask to provide a urine sample. The urine sample will be tested for bacteria and white blood cells. White blood cells are made by your body to help fight infection. TREATMENT  Typically, UTIs can be treated with medication. Because most UTIs are caused by a bacterial infection, they usually can be treated with the use of antibiotics. The choice of antibiotic and length of treatment depend on your symptoms and the type of bacteria causing your infection. HOME CARE INSTRUCTIONS  If you were prescribed antibiotics, take them exactly as your caregiver instructs you. Finish the medication even if you feel better after you  have only taken some of the medication.  Drink enough water and fluids to keep your urine clear or pale yellow.  Avoid caffeine, tea, and carbonated beverages. They tend to irritate your bladder.  Empty your bladder often. Avoid holding urine for long periods of time.  Empty your bladder before and after sexual intercourse.  After a bowel movement, women should cleanse from front to back. Use each tissue only once. SEEK MEDICAL CARE IF:   You have back pain.  You develop a fever.  Your symptoms do not begin to resolve within 3 days. SEEK IMMEDIATE MEDICAL CARE IF:   You have severe back pain or lower abdominal pain.  You develop chills.  You have nausea or vomiting.  You have continued burning or discomfort with urination. MAKE SURE YOU:   Understand these instructions.  Will watch your condition.  Will get help right away if you are not doing well or get worse. Document Released: 12/18/2004 Document Revised: 09/09/2011 Document Reviewed: 04/18/2011 ExitCare Patient Information 2015 [employer]. This information is not intended to replace advice given to you by your health care provider. Make sure you discuss any questions you have with your health care provider. Vaginitis Vaginitis is an inflammation of the vagina. It is most often caused by a change in the normal balance of the bacteria and yeast that live in the vagina. This change in balance causes an overgrowth of certain bacteria or yeast, which causes the inflammation. There are different types of vaginitis, but the most common types are:  Bacterial vaginosis.  Yeast infection (candidiasis).  Trichomoniasis   vaginitis. This is a sexually transmitted infection (STI).  Viral vaginitis.  Atropic vaginitis.  Allergic vaginitis. CAUSES  The cause depends on the type of vaginitis. Vaginitis can be caused by:  Bacteria (bacterial vaginosis).  Yeast (yeast infection).  A parasite (trichomoniasis  vaginitis)  A virus (viral vaginitis).  Low hormone levels (atrophic vaginitis). Low hormone levels can occur during pregnancy, breastfeeding, or after menopause.  Irritants, such as bubble baths, scented tampons, and feminine sprays (allergic vaginitis). Other factors can change the normal balance of the yeast and bacteria that live in the vagina. These include:  Antibiotic medicines.  Poor hygiene.  Diaphragms, vaginal sponges, spermicides, birth control pills, and intrauterine devices (IUD).  Sexual intercourse.  Infection.  Uncontrolled diabetes.  A weakened immune system. SYMPTOMS  Symptoms can vary depending on the cause of the vaginitis. Common symptoms include:  Abnormal vaginal discharge.  The discharge is white, gray, or yellow with bacterial vaginosis.  The discharge is thick, white, and cheesy with a yeast infection.  The discharge is frothy and yellow or greenish with trichomoniasis.  A bad vaginal odor.  The odor is fishy with bacterial vaginosis.  Vaginal itching, pain, or swelling.  Painful intercourse.  Pain or burning when urinating. Sometimes, there are no symptoms. TREATMENT  Treatment will vary depending on the type of infection.   Bacterial vaginosis and trichomoniasis are often treated with antibiotic creams or pills.  Yeast infections are often treated with antifungal medicines, such as vaginal creams or suppositories.  Viral vaginitis has no cure, but symptoms can be treated with medicines that relieve discomfort. Your sexual partner should be treated as well.  Atrophic vaginitis may be treated with an estrogen cream, pill, suppository, or vaginal ring. If vaginal dryness occurs, lubricants and moisturizing creams may help. You may be told to avoid scented soaps, sprays, or douches.  Allergic vaginitis treatment involves quitting the use of the product that is causing the problem. Vaginal creams can be used to treat the symptoms. HOME  CARE INSTRUCTIONS   Take all medicines as directed by your caregiver.  Keep your genital area clean and dry. Avoid soap and only rinse the area with water.  Avoid douching. It can remove the healthy bacteria in the vagina.  Do not use tampons or have sexual intercourse until your vaginitis has been treated. Use sanitary pads while you have vaginitis.  Wipe from front to back. This avoids the spread of bacteria from the rectum to the vagina.  Let air reach your genital area.  Wear cotton underwear to decrease moisture buildup.  Avoid wearing underwear while you sleep until your vaginitis is gone.  Avoid tight pants and underwear or nylons without a cotton panel.  Take off wet clothing (especially bathing suits) as soon as possible.  Use mild, non-scented products. Avoid using irritants, such as:  Scented feminine sprays.  Fabric softeners.  Scented detergents.  Scented tampons.  Scented soaps or bubble baths.  Practice safe sex and use condoms. Condoms may prevent the spread of trichomoniasis and viral vaginitis. SEEK MEDICAL CARE IF:   You have abdominal pain.  You have a fever or persistent symptoms for more than 2-3 days.  You have a fever and your symptoms suddenly get worse. Document Released: 01/05/2007 Document Revised: 12/03/2011 Document Reviewed: 08/21/2011 Exit[employer] Patient Information 2015 [employer]. This information is not intended to replace advice given to you by your health care provider. Make sure you discuss any questions you have with your health care provider.  

## 2013-09-28 NOTE — ED Notes (Signed)
Patient reports frequent urination, denies burning with urination, reports frequent urination.  Patient reports "tingle" with urination

## 2013-09-29 LAB — URINE CULTURE
COLONY COUNT: NO GROWTH
CULTURE: NO GROWTH

## 2013-10-03 NOTE — ED Notes (Signed)
GC/Chlamydia neg., Affirm: Candida pos., Gardnerella and Trich neg.,  Urine culture: no growth.  Pt. adequately treated with Diflucan. Vassie MoselleYork, Brooke Steinhilber M 10/03/2013

## 2013-12-08 ENCOUNTER — Other Ambulatory Visit: Payer: Self-pay | Admitting: Obstetrics and Gynecology

## 2013-12-09 LAB — CYTOLOGY - PAP

## 2014-01-20 ENCOUNTER — Encounter (HOSPITAL_COMMUNITY): Payer: Self-pay | Admitting: Emergency Medicine

## 2014-01-20 DIAGNOSIS — Z79899 Other long term (current) drug therapy: Secondary | ICD-10-CM | POA: Insufficient documentation

## 2014-01-20 DIAGNOSIS — Z793 Long term (current) use of hormonal contraceptives: Secondary | ICD-10-CM | POA: Diagnosis not present

## 2014-01-20 DIAGNOSIS — Z3202 Encounter for pregnancy test, result negative: Secondary | ICD-10-CM | POA: Insufficient documentation

## 2014-01-20 DIAGNOSIS — M545 Low back pain: Secondary | ICD-10-CM | POA: Diagnosis not present

## 2014-01-20 DIAGNOSIS — N200 Calculus of kidney: Secondary | ICD-10-CM | POA: Insufficient documentation

## 2014-01-20 DIAGNOSIS — Z87828 Personal history of other (healed) physical injury and trauma: Secondary | ICD-10-CM | POA: Diagnosis not present

## 2014-01-20 DIAGNOSIS — R109 Unspecified abdominal pain: Secondary | ICD-10-CM | POA: Diagnosis present

## 2014-01-20 LAB — COMPREHENSIVE METABOLIC PANEL
ALBUMIN: 3.8 g/dL (ref 3.5–5.2)
ALT: 21 U/L (ref 0–35)
ANION GAP: 14 (ref 5–15)
AST: 22 U/L (ref 0–37)
Alkaline Phosphatase: 95 U/L (ref 39–117)
BUN: 10 mg/dL (ref 6–23)
CALCIUM: 9.2 mg/dL (ref 8.4–10.5)
CO2: 23 mEq/L (ref 19–32)
Chloride: 102 mEq/L (ref 96–112)
Creatinine, Ser: 0.75 mg/dL (ref 0.50–1.10)
GFR calc non Af Amer: >90 mL/min (ref >90)
GLUCOSE: 93 mg/dL (ref 70–99)
Potassium: 3.7 mEq/L (ref 3.7–5.3)
Sodium: 139 mEq/L (ref 137–147)
TOTAL PROTEIN: 7.8 g/dL (ref 6.0–8.3)
Total Bilirubin: 0.3 mg/dL (ref 0.3–1.2)

## 2014-01-20 LAB — CBC WITH DIFFERENTIAL/PLATELET
BASOS PCT: 0 % (ref 0–1)
Basophils Absolute: 0 10*3/uL (ref 0.0–0.1)
EOS ABS: 0.2 10*3/uL (ref 0.0–0.7)
EOS PCT: 1 % (ref 0–5)
HCT: 37.5 % (ref 36.0–46.0)
HEMOGLOBIN: 13 g/dL (ref 12.0–15.0)
LYMPHS ABS: 3.6 10*3/uL (ref 0.7–4.0)
Lymphocytes Relative: 29 % (ref 12–46)
MCH: 30 pg (ref 26.0–34.0)
MCHC: 34.7 g/dL (ref 30.0–36.0)
MCV: 86.4 fL (ref 78.0–100.0)
MONOS PCT: 6 % (ref 3–12)
Monocytes Absolute: 0.8 10*3/uL (ref 0.1–1.0)
Neutro Abs: 8 10*3/uL — ABNORMAL HIGH (ref 1.7–7.7)
Neutrophils Relative %: 64 % (ref 43–77)
PLATELETS: 322 10*3/uL (ref 150–400)
RBC: 4.34 MIL/uL (ref 3.87–5.11)
RDW: 12.7 % (ref 11.5–15.5)
WBC: 12.6 10*3/uL — ABNORMAL HIGH (ref 4.0–10.5)

## 2014-01-20 LAB — LIPASE, BLOOD: LIPASE: 23 U/L (ref 11–59)

## 2014-01-20 NOTE — ED Notes (Signed)
Pt. reports mid/upper abdominal pain and low back pain onset last week , denies nausea or vomitting / no diarrhea. Denies fever or chills.

## 2014-01-21 ENCOUNTER — Emergency Department (HOSPITAL_COMMUNITY)
Admission: EM | Admit: 2014-01-21 | Discharge: 2014-01-21 | Disposition: A | Discharge status: Home / Self Care | Payer: BC Managed Care – PPO | Attending: Emergency Medicine | Admitting: Emergency Medicine

## 2014-01-21 ENCOUNTER — Emergency Department (HOSPITAL_COMMUNITY): Payer: BC Managed Care – PPO

## 2014-01-21 DIAGNOSIS — N200 Calculus of kidney: Secondary | ICD-10-CM

## 2014-01-21 DIAGNOSIS — M545 Low back pain, unspecified: Secondary | ICD-10-CM

## 2014-01-21 DIAGNOSIS — R1013 Epigastric pain: Secondary | ICD-10-CM

## 2014-01-21 DIAGNOSIS — R319 Hematuria, unspecified: Secondary | ICD-10-CM

## 2014-01-21 LAB — WET PREP, GENITAL
TRICH WET PREP: NONE SEEN
WBC, Wet Prep HPF POC: NONE SEEN
YEAST WET PREP: NONE SEEN

## 2014-01-21 LAB — URINALYSIS, ROUTINE W REFLEX MICROSCOPIC
BILIRUBIN URINE: NEGATIVE
Glucose, UA: NEGATIVE mg/dL
Ketones, ur: 15 mg/dL — AB
Nitrite: NEGATIVE
PROTEIN: 100 mg/dL — AB
Specific Gravity, Urine: 1.033 — ABNORMAL HIGH (ref 1.005–1.030)
UROBILINOGEN UA: 0.2 mg/dL (ref 0.0–1.0)
pH: 5.5 (ref 5.0–8.0)

## 2014-01-21 LAB — URINE MICROSCOPIC-ADD ON

## 2014-01-21 LAB — PREGNANCY, URINE: Preg Test, Ur: NEGATIVE

## 2014-01-21 MED ORDER — CIPROFLOXACIN HCL 500 MG PO TABS
500.0000 mg | ORAL_TABLET | Freq: Once | ORAL | Status: AC
  Administered 2014-01-21: 500 mg via ORAL
  Filled 2014-01-21: qty 1

## 2014-01-21 MED ORDER — GI COCKTAIL ~~LOC~~
30.0000 mL | Freq: Once | ORAL | Status: AC
  Administered 2014-01-21: 30 mL via ORAL
  Filled 2014-01-21: qty 30

## 2014-01-21 MED ORDER — ONDANSETRON 8 MG PO TBDP: 8.0000 mg | ORAL_TABLET | Freq: Three times a day (TID) | ORAL | Status: DC | PRN

## 2014-01-21 MED ORDER — TRAMADOL HCL 50 MG PO TABS
50.0000 mg | ORAL_TABLET | Freq: Four times a day (QID) | ORAL | Status: DC | PRN
Start: 2014-01-21 — End: 2014-09-17

## 2014-01-21 MED ORDER — ONDANSETRON 4 MG PO TBDP
8.0000 mg | ORAL_TABLET | Freq: Once | ORAL | Status: AC
  Administered 2014-01-21: 8 mg via ORAL
  Filled 2014-01-21: qty 2

## 2014-01-21 MED ORDER — CIPROFLOXACIN HCL 500 MG PO TABS: 500.0000 mg | ORAL_TABLET | Freq: Two times a day (BID) | ORAL | Status: DC

## 2014-01-21 MED ORDER — NAPROXEN 500 MG PO TABS: 500.0000 mg | ORAL_TABLET | Freq: Two times a day (BID) | ORAL | Status: DC

## 2014-01-21 MED ORDER — TRAMADOL HCL 50 MG PO TABS
50.0000 mg | ORAL_TABLET | Freq: Once | ORAL | Status: AC
  Administered 2014-01-21: 50 mg via ORAL
  Filled 2014-01-21: qty 1

## 2014-01-21 NOTE — ED Provider Notes (Signed)
CSN: [id number]     Arrival date & time 01/20/14  2128 History   First MD Initiated Contact with Patient 01/21/14 0031     Chief Complaint  Patient presents with  . Abdominal Pain  . Back Pain     (Consider location/radiation/quality/duration/timing/severity/associated sxs/prior Treatment) HPI 23 year old female presents to the emergency department with complaint of low back pain and abdominal pain for the last week.  She denies any vomiting.  She reports the first 3 days of her illness she had some nausea.  No diarrhea, normal bowel movements.  No fever or chills.  No sick contacts, no unusual foods.  Pain is dull and sharp.  It is diffusely the abdomen.  No prior history of same.  She has been taking ibuprofen for her low back pain.  Low back pain is worse with movement and palpation.  She denies any dysuria, no vaginal discharge, no new sexual partners.  Last mental.  Was prepped with 3-4 weeks ago.  She recently started a new oral contraceptive about a month ago. Past Medical History  Diagnosis Date  . Lateral meniscus tear 04/2012    left  . Meniscal cyst 04/2012    left knee   Past Surgical History  Procedure Laterality Date  . Appendectomy  04/01/2004    ex. lap.  . Knee surgery     Family History  Problem Relation Age of Onset  . Anesthesia problems Mother     post-op N/V, hard to wake up   History  Substance Use Topics  . Smoking status: Never Smoker   . Smokeless tobacco: Never Used  . Alcohol Use: Yes   OB History   Grav Para Term Preterm Abortions TAB SAB Ect Mult Living                 Review of Systems  See History of Present Illness; otherwise all other systems are reviewed and negative   Allergies  Review of patient's allergies indicates no known allergies.  Home Medications   Prior to Admission medications   Medication Sig Start Date End Date Taking? Authorizing Provider  ibuprofen (ADVIL,MOTRIN) 200 MG tablet Take 800 mg by mouth every 6 (six) hours  as needed for moderate pain.   Yes Historical Provider, MD  OVER THE COUNTER MEDICATION Take 5 mLs by mouth every other day. utra burst multi vitamin   Yes Historical Provider, MD  PRESCRIPTION MEDICATION Take 1 tablet by mouth daily. Birth control pill   Yes Historical Provider, MD   BP 110/69  Pulse 69  Temp(Src) 98.5 F (36.9 C) (Oral)  Resp 20  SpO2 99%  LMP 12/29/2013 Physical Exam  Nursing note and vitals reviewed. Constitutional: She is oriented to person, place, and time. She appears well-developed and well-nourished. No distress.  HENT:  Head: Normocephalic and atraumatic.  Right Ear: External ear normal.  Left Ear: External ear normal.  Nose: Nose normal.  Mouth/Throat: Oropharynx is clear and moist.  Eyes: Conjunctivae and EOM are normal. Pupils are equal, round, and reactive to light.  Neck: Normal range of motion. Neck supple. No JVD present. No tracheal deviation present. No thyromegaly present.  Cardiovascular: Normal rate, regular rhythm, normal heart sounds and intact distal pulses.  Exam reveals no gallop and no friction rub.   No murmur heard. Pulmonary/Chest: Effort normal and breath sounds normal. No stridor. No respiratory distress. She has no wheezes. She has no rales. She exhibits no tenderness.  Abdominal: Soft. Bowel sounds are normal. She  exhibits no distension and no mass. There is tenderness (tenderness to palpation in epigastrium). There is no rebound and no guarding.  Genitourinary:  External genitalia within normal limits Vagina with discharge Cervix  normal negative for cervical motion tenderness Adnexa palpated, no masses or negative for tenderness noted Bladder palpated negative for tenderness Uterus palpated no masses or negative for tenderness    Musculoskeletal: Normal range of motion. She exhibits tenderness (mild low back pain with tenderness.  Normal movement no step-off or crepitus). She exhibits no edema.  Lymphadenopathy:    She has no  cervical adenopathy.  Neurological: She is alert and oriented to person, place, and time. She displays normal reflexes. She exhibits normal muscle tone. Coordination normal.  Skin: Skin is warm and dry. No rash noted. No erythema. No pallor.  Psychiatric: She has a normal mood and affect. Her behavior is normal. Judgment and thought content normal.    ED Course  Procedures (including critical care time) Labs Review Labs Reviewed  WET PREP, GENITAL - Abnormal; Notable for the following:    Clue Cells Wet Prep HPF POC RARE (*)    All other components within normal limits  CBC WITH DIFFERENTIAL - Abnormal; Notable for the following:    WBC 12.6 (*)    Neutro Abs 8.0 (*)    All other components within normal limits  URINALYSIS, ROUTINE W REFLEX MICROSCOPIC - Abnormal; Notable for the following:    APPearance CLOUDY (*)    Specific Gravity, Urine 1.033 (*)    Hgb urine dipstick LARGE (*)    Ketones, ur 15 (*)    Protein, ur 100 (*)    Leukocytes, UA SMALL (*)    All other components within normal limits  URINE MICROSCOPIC-ADD ON - Abnormal; Notable for the following:    Bacteria, UA MANY (*)    All other components within normal limits  GC/CHLAMYDIA PROBE AMP  URINE CULTURE  COMPREHENSIVE METABOLIC PANEL  LIPASE, BLOOD  PREGNANCY, URINE    Imaging Review Ct Abdomen Pelvis Wo Contrast  01/21/2014   CLINICAL DATA:  Hematuria. Back pain and flank pain and pelvic pain.  EXAM: CT ABDOMEN AND PELVIS WITHOUT CONTRAST  TECHNIQUE: Multidetector CT imaging of the abdomen and pelvis was performed following the standard protocol without IV contrast.  COMPARISON:  CT scan dated 04/01/2004  FINDINGS: There is an 11 mm irregular stone in the left renal pelvis. There is slight dilatation of the proximal left renal collecting system as well as of the left ureter just below the stone. I do not think this is creating obstruction.  The right kidney is normal.  Liver, biliary tree, spleen, pancreas, and  adrenal glands are normal. The patient has had an appendectomy. There is a 1 cm appendiceal stump.  Uterus and ovaries are normal. Small phlebolith in the left side of the pelvis. No osseous abnormality.  IMPRESSION: 11 mm stone in the left renal pelvis. I suspect this is secondary to chronic infection.   Electronically Signed   By: Geanie CooleyJim  Maxwell M.D.   On: 01/21/2014 05:30     EKG Interpretation None      MDM   Final diagnoses:  Hematuria  Renal stone  Epigastric pain  Bilateral low back pain without sciatica   23 year old female with abdominal pain.  Pain with palpation in epigastrium.  Labs are unremarkable.  Plan for symptomatic treatment.  Urine noted to have an excess of blood.  Plan for pelvic exam to see if this is  from menses.  If not, plan for non-con abdomen and pelvis CT scan for possible kidney stone.  CT scan shows 11 mm stone and left renal pelvis, will treat for pain and start Cipro as patient has many bacteria in her urine along with WBCs.  Patient instructed she needs to follow-up with urology.  She was given precautions for return to the emergency department.  Olivia Mackie, MD 01/21/14 [id number]

## 2014-01-21 NOTE — Discharge Instructions (Signed)
Please follow up with urology clinic.  Your CT scan today showed an 11 mm stone in your left kidney which may be causing the blood seen in your urine.  Take medications as prescribed.  Return to the ER for fever, worsening pain, or other new concerning symptoms.   Abdominal Pain Many things can cause abdominal pain. Usually, abdominal pain is not caused by a disease and will improve without treatment. It can often be observed and treated at home. Your health care provider will do a physical exam and possibly order blood tests and X-rays to help determine the seriousness of your pain. However, in many cases, more time must pass before a clear cause of the pain can be found. Before that point, your health care provider may not know if you need more testing or further treatment. HOME CARE INSTRUCTIONS  Monitor your abdominal pain for any changes. The following actions may help to alleviate any discomfort you are experiencing:  Only take over-the-counter or prescription medicines as directed by your health care provider.  Do not take laxatives unless directed to do so by your health care provider.  Try a clear liquid diet (broth, tea, or water) as directed by your health care provider. Slowly move to a bland diet as tolerated. SEEK MEDICAL CARE IF:  You have unexplained abdominal pain.  You have abdominal pain associated with nausea or diarrhea.  You have pain when you urinate or have a bowel movement.  You experience abdominal pain that wakes you in the night.  You have abdominal pain that is worsened or improved by eating food.  You have abdominal pain that is worsened with eating fatty foods.  You have a fever. SEEK IMMEDIATE MEDICAL CARE IF:   Your pain does not go away within 2 hours.  You keep throwing up (vomiting).  Your pain is felt only in portions of the abdomen, such as the right side or the left lower portion of the abdomen.  You pass bloody or black tarry stools. MAKE  SURE YOU:  Understand these instructions.   Will watch your condition.   Will get help right away if you are not doing well or get worse.  Document Released: 12/18/2004 Document Revised: 03/15/2013 Document Reviewed: 11/17/2012 [facility]ExitCare Patient Information 2015 [place], [place]. This information is not intended to replace advice given to you by your health care provider. Make sure you discuss any questions you have with your health care provider.  Kidney Stones Kidney stones (urolithiasis) are deposits that form inside your kidneys. The intense pain is caused by the stone moving through the urinary tract. When the stone moves, the ureter goes into spasm around the stone. The stone is usually passed in the urine.  CAUSES   A disorder that makes certain neck glands produce too much parathyroid hormone (primary hyperparathyroidism).  A buildup of uric acid crystals, similar to gout in your joints.  Narrowing (stricture) of the ureter.  A kidney obstruction present at birth (congenital obstruction).  Previous surgery on the kidney or ureters.  Numerous kidney infections. SYMPTOMS   Feeling sick to your stomach (nauseous).  Throwing up (vomiting).  Blood in the urine (hematuria).  Pain that usually spreads (radiates) to the groin.  Frequency or urgency of urination. DIAGNOSIS   Taking a history and physical exam.  Blood or urine tests.  CT scan.  Occasionally, an examination of the inside of the urinary bladder (cystoscopy) is performed. TREATMENT   Observation.  Increasing your fluid intake.  Extracorporeal shock wave lithotripsy--This is a noninvasive procedure that uses shock waves to break up kidney stones.  Surgery may be needed if you have severe pain or persistent obstruction. There are various surgical procedures. Most of the procedures are performed with the use of small instruments. Only small incisions are needed to accommodate these instruments, so  recovery time is minimized. The size, location, and chemical composition are all important variables that will determine the proper choice of action for you. Talk to your health care provider to better understand your situation so that you will minimize the risk of injury to yourself and your kidney.  HOME CARE INSTRUCTIONS   Drink enough water and fluids to keep your urine clear or pale yellow. This will help you to pass the stone or stone fragments.  Strain all urine through the provided strainer. Keep all particulate matter and stones for your health care provider to see. The stone causing the pain may be as small as a grain of salt. It is very important to use the strainer each and every time you pass your urine. The collection of your stone will allow your health care provider to analyze it and verify that a stone has actually passed. The stone analysis will often identify what you can do to reduce the incidence of recurrences.  Only take over-the-counter or prescription medicines for pain, discomfort, or fever as directed by your health care provider.  Make a follow-up appointment with your health care provider as directed.  Get follow-up X-rays if required. The absence of pain does not always mean that the stone has passed. It may have only stopped moving. If the urine remains completely obstructed, it can cause loss of kidney function or even complete destruction of the kidney. It is your responsibility to make sure X-rays and follow-ups are completed. Ultrasounds of the kidney can show blockages and the status of the kidney. Ultrasounds are not associated with any radiation and can be performed easily in a matter of minutes. SEEK MEDICAL CARE IF:  You experience pain that is progressive and unresponsive to any pain medicine you have been prescribed. SEEK IMMEDIATE MEDICAL CARE IF:   Pain cannot be controlled with the prescribed medicine.  You have a fever or shaking chills.  The  severity or intensity of pain increases over 18 hours and is not relieved by pain medicine.  You develop a new onset of abdominal pain.  You feel faint or pass out.  You are unable to urinate. MAKE SURE YOU:   Understand these instructions.  Will watch your condition.  Will get help right away if you are not doing well or get worse. Document Released: 03/10/2005 Document Revised: 11/10/2012 Document Reviewed: 08/11/2012 [facility]Exit[facility] Patient Information 2015 [place]ExitCare, [place]LL[place]. This information is not intended to replace advice given to you by your health care provider. Make sure you discuss any questions you have with your health care provider.

## 2014-01-22 LAB — URINE CULTURE: Colony Count: 80000

## 2014-01-24 LAB — GC/CHLAMYDIA PROBE AMP
CT Probe RNA: NEGATIVE
GC Probe RNA: NEGATIVE

## 2014-02-28 ENCOUNTER — Encounter (HOSPITAL_COMMUNITY): Payer: Self-pay | Admitting: Emergency Medicine

## 2014-02-28 ENCOUNTER — Emergency Department (HOSPITAL_COMMUNITY)
Admission: EM | Admit: 2014-02-28 | Discharge: 2014-02-28 | Disposition: A | Discharge status: Home / Self Care | Payer: BC Managed Care – PPO | Attending: Emergency Medicine | Admitting: Emergency Medicine

## 2014-02-28 ENCOUNTER — Emergency Department (HOSPITAL_COMMUNITY): Payer: BC Managed Care – PPO

## 2014-02-28 DIAGNOSIS — R0600 Dyspnea, unspecified: Secondary | ICD-10-CM | POA: Diagnosis not present

## 2014-02-28 DIAGNOSIS — R0602 Shortness of breath: Secondary | ICD-10-CM

## 2014-02-28 DIAGNOSIS — Z87828 Personal history of other (healed) physical injury and trauma: Secondary | ICD-10-CM | POA: Diagnosis not present

## 2014-02-28 DIAGNOSIS — Z791 Long term (current) use of non-steroidal anti-inflammatories (NSAID): Secondary | ICD-10-CM | POA: Diagnosis not present

## 2014-02-28 DIAGNOSIS — Z793 Long term (current) use of hormonal contraceptives: Secondary | ICD-10-CM | POA: Diagnosis not present

## 2014-02-28 DIAGNOSIS — Z79899 Other long term (current) drug therapy: Secondary | ICD-10-CM | POA: Insufficient documentation

## 2014-02-28 DIAGNOSIS — Z8739 Personal history of other diseases of the musculoskeletal system and connective tissue: Secondary | ICD-10-CM | POA: Diagnosis not present

## 2014-02-28 LAB — BASIC METABOLIC PANEL
Anion gap: 13 (ref 5–15)
BUN: 9 mg/dL (ref 6–23)
CO2: 23 mEq/L (ref 19–32)
Calcium: 9.5 mg/dL (ref 8.4–10.5)
Chloride: 104 mEq/L (ref 96–112)
Creatinine, Ser: 0.66 mg/dL (ref 0.50–1.10)
GFR calc non Af Amer: >90 mL/min (ref >90)
Glucose, Bld: 89 mg/dL (ref 70–99)
POTASSIUM: 4.1 meq/L (ref 3.7–5.3)
Sodium: 140 mEq/L (ref 137–147)

## 2014-02-28 LAB — I-STAT TROPONIN, ED: TROPONIN I, POC: 0 ng/mL (ref 0.00–0.08)

## 2014-02-28 LAB — CBC
HCT: 34.9 % — ABNORMAL LOW (ref 36.0–46.0)
Hemoglobin: 11.7 g/dL — ABNORMAL LOW (ref 12.0–15.0)
MCH: 28.7 pg (ref 26.0–34.0)
MCHC: 33.5 g/dL (ref 30.0–36.0)
MCV: 85.7 fL (ref 78.0–100.0)
PLATELETS: 342 10*3/uL (ref 150–400)
RBC: 4.07 MIL/uL (ref 3.87–5.11)
RDW: 12.6 % (ref 11.5–15.5)
WBC: 10.2 10*3/uL (ref 4.0–10.5)

## 2014-02-28 NOTE — ED Notes (Signed)
Patient with shortness of breath.  She states that she has it all the time when she is eating, it is hard to eat for her.  She noticed that it is worse today.  Patient states she is having chest tightness at this time.

## 2014-02-28 NOTE — ED Notes (Signed)
Patient transported to X-ray 

## 2014-02-28 NOTE — ED Notes (Signed)
Pt ambulating independently w/ steady gait on d/c in no acute distress, A&Ox4.  

## 2014-02-28 NOTE — ED Provider Notes (Signed)
CSN: [id number]     Arrival date & time 02/28/14  2113 History   First MD Initiated Contact with Patient 02/28/14 2151     Chief Complaint  Patient presents with  . Shortness of Breath     HPI  She presents for evaluation of "sometimes I feel like I just need to take a deep breath". She does not state that she has anxiety or describe this in a figurative description. She states that she will occasionally just feel like she has to take a deeper breath. Does not feel short of breath. Does not feel like she is wheezing. Does not have cough shortness of breath or pain or tightness. It is been going on for several months. No escalation. Symptoms with exertion. No leg swelling. No chest pain. No cough sputum production or other concerns.  Past Medical History  Diagnosis Date  . Lateral meniscus tear 04/2012    left  . Meniscal cyst 04/2012    left knee   Past Surgical History  Procedure Laterality Date  . Appendectomy  04/01/2004    ex. lap.  . Knee surgery     Family History  Problem Relation Age of Onset  . Anesthesia problems Mother     post-op N/V, hard to wake up   History  Substance Use Topics  . Smoking status: Never Smoker   . Smokeless tobacco: Never Used  . Alcohol Use: Yes   OB History    No data available     Review of Systems  Constitutional: Negative for fever, chills, diaphoresis, appetite change and fatigue.  HENT: Negative for mouth sores, sore throat and trouble swallowing.   Eyes: Negative for visual disturbance.  Respiratory: Negative for cough, chest tightness, shortness of breath and wheezing.   Cardiovascular: Negative for chest pain.  Gastrointestinal: Negative for nausea, vomiting, abdominal pain, diarrhea and abdominal distention.  Endocrine: Negative for polydipsia, polyphagia and polyuria.  Genitourinary: Negative for dysuria, frequency and hematuria.  Musculoskeletal: Negative for gait problem.  Skin: Negative for color change, pallor and rash.   Neurological: Negative for dizziness, syncope, light-headedness and headaches.  Hematological: Does not bruise/bleed easily.  Psychiatric/Behavioral: Negative for behavioral problems and confusion.      Allergies  Review of patient's allergies indicates no known allergies.  Home Medications   Prior to Admission medications   Medication Sig Start Date End Date Taking? Authorizing Provider  ibuprofen (ADVIL,MOTRIN) 200 MG tablet Take 800 mg by mouth every 6 (six) hours as needed for moderate pain.   Yes Historical Provider, MD  OVER THE COUNTER MEDICATION Take 5 mLs by mouth every other day. utra burst multi vitamin   Yes Historical Provider, MD  PRESCRIPTION MEDICATION Take 1 tablet by mouth daily. Birth control pill   Yes Historical Provider, MD  traMADol (ULTRAM) 50 MG tablet Take 1 tablet (50 mg total) by mouth every 6 (six) hours as needed. 01/21/14  Yes Olivia Mackielga M Otter, MD  ciprofloxacin (CIPRO) 500 MG tablet Take 1 tablet (500 mg total) by mouth 2 (two) times daily. Patient not taking: Reported on 02/28/2014 01/21/14   Olivia Mackielga M Otter, MD  naproxen (NAPROSYN) 500 MG tablet Take 1 tablet (500 mg total) by mouth 2 (two) times daily. Patient not taking: Reported on 02/28/2014 01/21/14   Olivia Mackielga M Otter, MD  ondansetron (ZOFRAN ODT) 8 MG disintegrating tablet Take 1 tablet (8 mg total) by mouth every 8 (eight) hours as needed for nausea or vomiting. Patient not taking: Reported on 02/28/2014  01/21/14   Olivia Mackielga M Otter, MD   BP 125/73 mmHg  Pulse 97  Temp(Src) 98.3 F (36.8 C) (Oral)  Resp 16  SpO2 100%  LMP 02/19/2014 Physical Exam  Constitutional: She is oriented to person, place, and time. She appears well-developed and well-nourished. No distress.  HENT:  Head: Normocephalic.  Eyes: Conjunctivae are normal. Pupils are equal, round, and reactive to light. No scleral icterus.  Neck: Normal range of motion. Neck supple. No thyromegaly present.  Cardiovascular: Normal rate and regular  rhythm.  Exam reveals no gallop and no friction rub.   No murmur heard. Pulmonary/Chest: Effort normal and breath sounds normal. No respiratory distress. She has no wheezes. She has no rales.  Abdominal: Soft. Bowel sounds are normal. She exhibits no distension. There is no tenderness. There is no rebound.  Musculoskeletal: Normal range of motion.  Neurological: She is alert and oriented to person, place, and time.  Skin: Skin is warm and dry. No rash noted.  Psychiatric: She has a normal mood and affect. Her behavior is normal.    ED Course  Procedures (including critical care time) Labs Review Labs Reviewed  CBC - Abnormal; Notable for the following:    Hemoglobin 11.7 (*)    HCT 34.9 (*)    All other components within normal limits  BASIC METABOLIC PANEL  I-STAT TROPOININ, ED    Imaging Review Dg Chest 2 View  02/28/2014   CLINICAL DATA:  Chest pain and shortness of Breath  EXAM: CHEST  2 VIEW  COMPARISON:  02/03/2012  FINDINGS: The heart size and mediastinal contours are within normal limits. Both lungs are clear. The visualized skeletal structures are unremarkable.  IMPRESSION: No active cardiopulmonary disease.   Electronically Signed   By: Burman NievesWilliam  Stevens M.D.   On: 02/28/2014 22:11     EKG Interpretation   Date/Time:  Tuesday February 28 2014 21:26:47 EST Ventricular Rate:  84 PR Interval:  154 QRS Duration: 80 QT Interval:  370 QTC Calculation: 437 R Axis:   92 Text Interpretation:  Normal sinus rhythm with sinus arrhythmia Rightward  axis Borderline ECG Confirmed by Fayrene FearingJAMES  MD, Bevan Vu ([id number]) on 02/28/2014  10:15:10 PM      MDM   Final diagnoses:  Dyspnea    No concerning history. Normal exam. Reassuring EKG and chest x-ray. Not febrile or hypoxemic. I think she is appropriate for outpatient follow-up with her primary care physician.    Rolland PorterMark Lashauna Arpin, MD 02/28/14 2242

## 2014-02-28 NOTE — Discharge Instructions (Signed)
Your history and exam today do not suggest an acute medical problem.  Follow-up with your regular physician with any additional concerns.

## 2014-03-13 ENCOUNTER — Emergency Department (HOSPITAL_COMMUNITY)
Admission: EM | Admit: 2014-03-13 | Discharge: 2014-03-13 | Disposition: A | Discharge status: Home / Self Care | Payer: BC Managed Care – PPO | Source: Home / Self Care | Attending: Family Medicine | Admitting: Family Medicine

## 2014-03-13 ENCOUNTER — Encounter (HOSPITAL_COMMUNITY): Payer: Self-pay | Admitting: Emergency Medicine

## 2014-03-13 ENCOUNTER — Other Ambulatory Visit (HOSPITAL_COMMUNITY)
Admission: RE | Admit: 2014-03-13 | Discharge: 2014-03-13 | Disposition: A | Discharge status: Home / Self Care | Payer: BC Managed Care – PPO | Source: Ambulatory Visit | Attending: Family Medicine | Admitting: Family Medicine

## 2014-03-13 DIAGNOSIS — N760 Acute vaginitis: Secondary | ICD-10-CM | POA: Insufficient documentation

## 2014-03-13 DIAGNOSIS — Z113 Encounter for screening for infections with a predominantly sexual mode of transmission: Secondary | ICD-10-CM | POA: Diagnosis present

## 2014-03-13 DIAGNOSIS — A499 Bacterial infection, unspecified: Secondary | ICD-10-CM

## 2014-03-13 DIAGNOSIS — N898 Other specified noninflammatory disorders of vagina: Secondary | ICD-10-CM

## 2014-03-13 DIAGNOSIS — B9689 Other specified bacterial agents as the cause of diseases classified elsewhere: Secondary | ICD-10-CM

## 2014-03-13 LAB — POCT URINALYSIS DIP (DEVICE)
Bilirubin Urine: NEGATIVE
Glucose, UA: NEGATIVE mg/dL
Ketones, ur: NEGATIVE mg/dL
NITRITE: NEGATIVE
Protein, ur: NEGATIVE mg/dL
Specific Gravity, Urine: 1.01 (ref 1.005–1.030)
Urobilinogen, UA: 0.2 mg/dL (ref 0.0–1.0)
pH: 6 (ref 5.0–8.0)

## 2014-03-13 LAB — POCT PREGNANCY, URINE: Preg Test, Ur: NEGATIVE

## 2014-03-13 MED ORDER — METRONIDAZOLE 500 MG PO TABS: 500.0000 mg | ORAL_TABLET | Freq: Two times a day (BID) | ORAL | Status: DC

## 2014-03-13 NOTE — Discharge Instructions (Signed)
Bacterial Vaginosis °Bacterial vaginosis is a vaginal infection that occurs when the normal balance of bacteria in the vagina is disrupted. It results from an overgrowth of certain bacteria. This is the most common vaginal infection in women of childbearing age. Treatment is important to prevent complications, especially in pregnant women, as it can cause a premature delivery. °CAUSES  °Bacterial vaginosis is caused by an increase in harmful bacteria that are normally present in smaller amounts in the vagina. Several different kinds of bacteria can cause bacterial vaginosis. However, the reason that the condition develops is not fully understood. °RISK FACTORS °Certain activities or behaviors can put you at an increased risk of developing bacterial vaginosis, including: °· Having a new sex partner or multiple sex partners. °· Douching. °· Using an intrauterine device (IUD) for contraception. °Women do not get bacterial vaginosis from toilet seats, bedding, swimming pools, or contact with objects around them. °SIGNS AND SYMPTOMS  °Some women with bacterial vaginosis have no signs or symptoms. Common symptoms include: °· Grey vaginal discharge. °· A fishlike odor with discharge, especially after sexual intercourse. °· Itching or burning of the vagina and vulva. °· Burning or pain with urination. °DIAGNOSIS  °Your health care provider will take a medical history and examine the vagina for signs of bacterial vaginosis. A sample of vaginal fluid may be taken. Your health care provider will look at this sample under a microscope to check for bacteria and abnormal cells. A vaginal pH test may also be done.  °TREATMENT  °Bacterial vaginosis may be treated with antibiotic medicines. These may be given in the form of a pill or a vaginal cream. A second round of antibiotics may be prescribed if the condition comes back after treatment.  °HOME CARE INSTRUCTIONS  °· Only take over-the-counter or prescription medicines as  directed by your health care provider. °· If antibiotic medicine was prescribed, take it as directed. Make sure you finish it even if you start to feel better. °· Do not have sex until treatment is completed. °· Tell all sexual partners that you have a vaginal infection. They should see their health care provider and be treated if they have problems, such as a mild rash or itching. °· Practice safe sex by using condoms and only having one sex partner. °SEEK MEDICAL CARE IF:  °· Your symptoms are not improving after 3 days of treatment. °· You have increased discharge or pain. °· You have a fever. °MAKE SURE YOU:  °· Understand these instructions. °· Will watch your condition. °· Will get help right away if you are not doing well or get worse. °FOR MORE INFORMATION  °Centers for Disease Control and Prevention, Division of STD Prevention: www.cdc.gov/std °American Sexual Health Association (ASHA): www.ashastd.org  °Document Released: 03/10/2005 Document Revised: 12/29/2012 Document Reviewed: 10/20/2012 °ExitCare® Patient Information ©2015 [employer]. This information is not intended to replace advice given to you by your health care provider. Make sure you discuss any questions you have with your health care provider. ° °Vaginitis °Vaginitis is an inflammation of the vagina. It is most often caused by a change in the normal balance of the bacteria and yeast that live in the vagina. This change in balance causes an overgrowth of certain bacteria or yeast, which causes the inflammation. There are different types of vaginitis, but the most common types are: °· Bacterial vaginosis. °· Yeast infection (candidiasis). °· Trichomoniasis vaginitis. This is a sexually transmitted infection (STI). °· Viral vaginitis. °· Atropic vaginitis. °· Allergic vaginitis. °  CAUSES  °The cause depends on the type of vaginitis. Vaginitis can be caused by: °· Bacteria (bacterial vaginosis). °· Yeast (yeast infection). °· A parasite  (trichomoniasis vaginitis) °· A virus (viral vaginitis). °· Low hormone levels (atrophic vaginitis). Low hormone levels can occur during pregnancy, breastfeeding, or after menopause. °· Irritants, such as bubble baths, scented tampons, and feminine sprays (allergic vaginitis). °Other factors can change the normal balance of the yeast and bacteria that live in the vagina. These include: °· Antibiotic medicines. °· Poor hygiene. °· Diaphragms, vaginal sponges, spermicides, birth control pills, and intrauterine devices (IUD). °· Sexual intercourse. °· Infection. °· Uncontrolled diabetes. °· A weakened immune system. °SYMPTOMS  °Symptoms can vary depending on the cause of the vaginitis. Common symptoms include: °· Abnormal vaginal discharge. °¨ The discharge is white, gray, or yellow with bacterial vaginosis. °¨ The discharge is thick, white, and cheesy with a yeast infection. °¨ The discharge is frothy and yellow or greenish with trichomoniasis. °· A bad vaginal odor. °¨ The odor is fishy with bacterial vaginosis. °· Vaginal itching, pain, or swelling. °· Painful intercourse. °· Pain or burning when urinating. °Sometimes, there are no symptoms. °TREATMENT  °Treatment will vary depending on the type of infection.  °· Bacterial vaginosis and trichomoniasis are often treated with antibiotic creams or pills. °· Yeast infections are often treated with antifungal medicines, such as vaginal creams or suppositories. °· Viral vaginitis has no cure, but symptoms can be treated with medicines that relieve discomfort. Your sexual partner should be treated as well. °· Atrophic vaginitis may be treated with an estrogen cream, pill, suppository, or vaginal ring. If vaginal dryness occurs, lubricants and moisturizing creams may help. You may be told to avoid scented soaps, sprays, or douches. °· Allergic vaginitis treatment involves quitting the use of the product that is causing the problem. Vaginal creams can be used to treat the  symptoms. °HOME CARE INSTRUCTIONS  °· Take all medicines as directed by your caregiver. °· Keep your genital area clean and dry. Avoid soap and only rinse the area with water. °· Avoid douching. It can remove the healthy bacteria in the vagina. °· Do not use tampons or have sexual intercourse until your vaginitis has been treated. Use sanitary pads while you have vaginitis. °· Wipe from front to back. This avoids the spread of bacteria from the rectum to the vagina. °· Let air reach your genital area. °¨ Wear cotton underwear to decrease moisture buildup. °¨ Avoid wearing underwear while you sleep until your vaginitis is gone. °¨ Avoid tight pants and underwear or nylons without a cotton panel. °¨ Take off wet clothing (especially bathing suits) as soon as possible. °· Use mild, non-scented products. Avoid using irritants, such as: °¨ Scented feminine sprays. °¨ Fabric softeners. °¨ Scented detergents. °¨ Scented tampons. °¨ Scented soaps or bubble baths. °· Practice safe sex and use condoms. Condoms may prevent the spread of trichomoniasis and viral vaginitis. °SEEK MEDICAL CARE IF:  °· You have abdominal pain. °· You have a fever or persistent symptoms for more than 2-3 days. °· You have a fever and your symptoms suddenly get worse. °Document Released: 01/05/2007 Document Revised: 12/03/2011 Document Reviewed: 08/21/2011 °ExitCare® Patient Information ©2015 [employer]. This information is not intended to replace advice given to you by your health care provider. Make sure you discuss any questions you have with your health care provider. ° °

## 2014-03-13 NOTE — ED Provider Notes (Signed)
CSN: [id number]     Arrival date & time 03/13/14  1812 History   First MD Initiated Contact with Patient 03/13/14 1913     No chief complaint on file.  (Consider location/radiation/quality/duration/timing/severity/associated sxs/prior Treatment) HPI Comments: 23 year old female complaining of a vaginal odor and vaginal discharge for 3 days. Denies pelvic pain. Denies current bleeding. She complained of tingling associated with urination twice today. Denies pain with urination or frequency. LMP 02/23/2014.   Past Medical History  Diagnosis Date  . Lateral meniscus tear 04/2012    left  . Meniscal cyst 04/2012    left knee   Past Surgical History  Procedure Laterality Date  . Appendectomy  04/01/2004    ex. lap.  . Knee surgery     Family History  Problem Relation Age of Onset  . Anesthesia problems Mother     post-op N/V, hard to wake up   History  Substance Use Topics  . Smoking status: Never Smoker   . Smokeless tobacco: Never Used  . Alcohol Use: Yes   OB History    No data available     Review of Systems  Constitutional: Negative.   Respiratory: Negative.   Cardiovascular: Negative for chest pain.  Gastrointestinal: Negative.   Genitourinary: Positive for vaginal discharge. Negative for dysuria, frequency, menstrual problem and pelvic pain.  Musculoskeletal: Negative.   Neurological: Negative.   All other systems reviewed and are negative.   Allergies  Review of patient's allergies indicates no known allergies.  Home Medications   Prior to Admission medications   Medication Sig Start Date End Date Taking? Authorizing Provider  ibuprofen (ADVIL,MOTRIN) 200 MG tablet Take 800 mg by mouth every 6 (six) hours as needed for moderate pain.    Historical Provider, MD  metroNIDAZOLE (FLAGYL) 500 MG tablet Take 1 tablet (500 mg total) by mouth 2 (two) times daily. X 7 days 03/13/14   Hayden Rasmussenavid Carsyn Boster, NP  OVER THE COUNTER MEDICATION Take 5 mLs by mouth every other day. utra  burst multi vitamin    Historical Provider, MD  PRESCRIPTION MEDICATION Take 1 tablet by mouth daily. Birth control pill    Historical Provider, MD  traMADol (ULTRAM) 50 MG tablet Take 1 tablet (50 mg total) by mouth every 6 (six) hours as needed. 01/21/14   Olivia Mackielga M Otter, MD   BP 135/85 mmHg  Pulse 92  Temp(Src) 97.9 F (36.6 C) (Oral)  Resp 16  SpO2 100%  LMP 02/19/2014 Physical Exam  Constitutional: She is oriented to person, place, and time. She appears well-developed. No distress.  Neck: Normal range of motion. Neck supple.  Pulmonary/Chest: Effort normal. No respiratory distress.  Genitourinary:  NEFG Cx anterior, midline. No lesions. Pink. Os closed Vaginal wall coated with thick gray discharge.  No CMT or adnexal tendernes  Neurological: She is alert and oriented to person, place, and time. She exhibits normal muscle tone.  Skin: Skin is warm and dry.  Psychiatric: She has a normal mood and affect.  Nursing note and vitals reviewed.   ED Course  Procedures (including critical care time) Labs Review Labs Reviewed  POCT URINALYSIS DIP (DEVICE) - Abnormal; Notable for the following:    Hgb urine dipstick MODERATE (*)    Leukocytes, UA TRACE (*)    All other components within normal limits  URINE CULTURE  POCT PREGNANCY, URINE  CERVICOVAGINAL ANCILLARY ONLY   Results for orders placed or performed during the hospital encounter of 03/13/14  POCT urinalysis dip (device)  Result  Value Ref Range   Glucose, UA NEGATIVE NEGATIVE mg/dL   Bilirubin Urine NEGATIVE NEGATIVE   Ketones, ur NEGATIVE NEGATIVE mg/dL   Specific Gravity, Urine 1.010 1.005 - 1.030   Hgb urine dipstick MODERATE (A) NEGATIVE   pH 6.0 5.0 - 8.0   Protein, ur NEGATIVE NEGATIVE mg/dL   Urobilinogen, UA 0.2 0.0 - 1.0 mg/dL   Nitrite NEGATIVE NEGATIVE   Leukocytes, UA TRACE (A) NEGATIVE  Pregnancy, urine POC  Result Value Ref Range   Preg Test, Ur NEGATIVE NEGATIVE    Imaging Review No results  found.   MDM   1. Vaginal discharge   2. BV (bacterial vaginosis)    Flagyl as dir Cytology pending. Urine culture pending    Hayden Rasmussenavid Arrow Emmerich, NP 03/13/14 1946

## 2014-03-13 NOTE — ED Notes (Signed)
Pt states that she has had vaginal discharge and odor since 03/10/2014 with some abdominal pain

## 2014-03-14 LAB — CERVICOVAGINAL ANCILLARY ONLY
CHLAMYDIA, DNA PROBE: NEGATIVE
Neisseria Gonorrhea: NEGATIVE

## 2014-03-15 LAB — CERVICOVAGINAL ANCILLARY ONLY
WET PREP (BD AFFIRM): NEGATIVE
Wet Prep (BD Affirm): NEGATIVE
Wet Prep (BD Affirm): POSITIVE — AB

## 2014-03-15 NOTE — ED Notes (Signed)
GC/chlamydia neg., Affirm: Candida and Trich neg., Gardnerella pos.  Pt. adequately treated with Flagyl. Vassie MoselleYork, Taiyo Kozma M 03/15/2014

## 2014-03-20 ENCOUNTER — Telehealth (HOSPITAL_COMMUNITY): Payer: Self-pay | Admitting: Family Medicine

## 2014-03-20 MED ORDER — METRONIDAZOLE 0.75 % VA GEL: 1.0000 | Freq: Every day | VAGINAL | Status: DC

## 2014-03-20 NOTE — ED Notes (Signed)
Patient recently diagnosed with DVT and treated with oral metronidazole. This medicine is causing vomiting. We'll prescribe metronidazole vaginal gel to use at bedtime for 5 days.  Rodolph BongEvan S Chase Knebel, MD 03/20/14 [phone]

## 2014-04-10 ENCOUNTER — Other Ambulatory Visit: Payer: Self-pay | Admitting: Urology

## 2014-05-02 NOTE — Patient Instructions (Addendum)
Erin Jacobson  05/02/2014   Your procedure is scheduled on: 05/05/14   [facility] and follow signs to               Short Stay Center at 10:00  AM.   Call this number if you have problems the morning of surgery [phone]   Remember:  Do not eat food or drink liquids :After Midnight.     Take these medicines the morning of surgery with A SIP OF WATER:                                You may not have any metal on your body including hair pins and              piercings  Do not wear jewelry, make-up, lotions, powders or perfumes.             Do not wear nail polish.  Do not shave  48 hours prior to surgery.              Men may shave face and neck.   Do not bring valuables to the hospital. [place] IS NOT             RESPONSIBLE   FOR VALUABLES.  Contacts, dentures or bridgework may not be worn into surgery.  Leave suitcase in the car. After surgery it may be brought to your room.     Patients discharged the day of surgery will not be allowed to drive home.  Name and phone number of your driver:  Special Instructions: N/A              Please read over the following fact sheets you were given: _____________________________________________________________________                                                     [place] - PREPARING FOR SURGERY  Before surgery, you can play an important role.  Because skin is not sterile, your skin needs to be as free of germs as possible.  You can reduce the number of germs on your skin by washing with CHG (chlorahexidine gluconate) soap before surgery.  CHG is an antiseptic cleaner which kills germs and bonds with the skin to continue killing germs even after washing. Please DO NOT use if you have an allergy to CHG or antibacterial soaps.  If your skin becomes reddened/irritated stop using the CHG and inform your nurse when you arrive at Short Stay. Do not shave (including legs and underarms)  for at least 48 hours prior to the first CHG shower.  You may shave your face. Please follow these instructions carefully:   1.  Shower with CHG Soap the night before surgery and the  morning of Surgery.   2.  If you choose to wash your hair, wash your hair first as usual with your  normal  Shampoo.   3.  After you shampoo, rinse your hair and body thoroughly to remove the  shampoo.  4.  Use CHG as you would any other liquid soap.  You can apply chg directly  to the skin and wash . Gently wash with scrungie or clean wascloth    5.  Apply the CHG Soap to your body ONLY FROM THE NECK DOWN.   Do not use on open                           Wound or open sores. Avoid contact with eyes, ears mouth and genitals (private parts).                        Genitals (private parts) with your normal soap.              6.  Wash thoroughly, paying special attention to the area where your surgery  will be performed.   7.  Thoroughly rinse your body with warm water from the neck down.   8.  DO NOT shower/wash with your normal soap after using and rinsing off  the CHG Soap .                9.  Pat yourself dry with a clean towel.             10.  Wear clean pajamas.             11.  Place clean sheets on your bed the night of your first shower and do not  sleep with pets.  Day of Surgery : Do not apply any lotions/deodorants the morning of surgery.  Please wear clean clothes to the hospital/surgery center.  FAILURE TO FOLLOW THESE INSTRUCTIONS MAY RESULT IN THE CANCELLATION OF YOUR SURGERY    PATIENT SIGNATURE_________________________________  ______________________________________________________________________

## 2014-05-03 ENCOUNTER — Encounter (HOSPITAL_COMMUNITY)
Admission: RE | Admit: 2014-05-03 | Discharge: 2014-05-03 | Disposition: A | Discharge status: Home / Self Care | Payer: BLUE CROSS/BLUE SHIELD | Source: Ambulatory Visit | Attending: Urology | Admitting: Urology

## 2014-05-03 ENCOUNTER — Encounter (HOSPITAL_COMMUNITY): Payer: Self-pay

## 2014-05-03 DIAGNOSIS — Z791 Long term (current) use of non-steroidal anti-inflammatories (NSAID): Secondary | ICD-10-CM | POA: Diagnosis not present

## 2014-05-03 DIAGNOSIS — Z79899 Other long term (current) drug therapy: Secondary | ICD-10-CM | POA: Diagnosis not present

## 2014-05-03 DIAGNOSIS — Z87891 Personal history of nicotine dependence: Secondary | ICD-10-CM | POA: Diagnosis not present

## 2014-05-03 DIAGNOSIS — N201 Calculus of ureter: Secondary | ICD-10-CM | POA: Diagnosis present

## 2014-05-03 DIAGNOSIS — K219 Gastro-esophageal reflux disease without esophagitis: Secondary | ICD-10-CM | POA: Diagnosis not present

## 2014-05-03 DIAGNOSIS — N23 Unspecified renal colic: Secondary | ICD-10-CM | POA: Diagnosis not present

## 2014-05-03 HISTORY — DX: Gastro-esophageal reflux disease without esophagitis: K21.9

## 2014-05-03 HISTORY — DX: Calculus of kidney: N20.0

## 2014-05-03 HISTORY — DX: Other complications of anesthesia, initial encounter: T88.59XA

## 2014-05-03 HISTORY — DX: Adverse effect of unspecified anesthetic, initial encounter: T41.45XA

## 2014-05-03 LAB — CBC
HCT: 36.9 % (ref 36.0–46.0)
Hemoglobin: 12.1 g/dL (ref 12.0–15.0)
MCH: 27.9 pg (ref 26.0–34.0)
MCHC: 32.8 g/dL (ref 30.0–36.0)
MCV: 85.2 fL (ref 78.0–100.0)
PLATELETS: 309 10*3/uL (ref 150–400)
RBC: 4.33 MIL/uL (ref 3.87–5.11)
RDW: 13.2 % (ref 11.5–15.5)
WBC: 11.1 10*3/uL — ABNORMAL HIGH (ref 4.0–10.5)

## 2014-05-03 LAB — HCG, SERUM, QUALITATIVE: Preg, Serum: NEGATIVE

## 2014-05-03 NOTE — Patient Instructions (Signed)
Posey Prontoene Lemme  05/03/2014   Your procedure is scheduled on: 05/05/14    [facility] and follow signs to               Short Stay Center at 10:00 AM.   Call this number if you have problems the morning of surgery [phone]   Remember:  Do not eat food or drink liquids :After Midnight.     Take these medicines the morning of surgery with A SIP OF WATER: NONE                               You may not have any metal on your body including hair pins and              piercings  Do not wear jewelry, make-up, lotions, powders or perfumes.             Do not wear nail polish.  Do not shave  48 hours prior to surgery.              Men may shave face and neck.   Do not bring valuables to the hospital. [place] IS NOT             RESPONSIBLE   FOR VALUABLES.  Contacts, dentures or bridgework may not be worn into surgery.  Leave suitcase in the car. After surgery it may be brought to your room.     Patients discharged the day of surgery will not be allowed to drive home.  Name and phone number of your driver:  Special Instructions: N/A              Please read over the following fact sheets you were given: _____________________________________________________________________                                                     [place] - PREPARING FOR SURGERY  Before surgery, you can play an important role.  Because skin is not sterile, your skin needs to be as free of germs as possible.  You can reduce the number of germs on your skin by washing with CHG (chlorahexidine gluconate) soap before surgery.  CHG is an antiseptic cleaner which kills germs and bonds with the skin to continue killing germs even after washing. Please DO NOT use if you have an allergy to CHG or antibacterial soaps.  If your skin becomes reddened/irritated stop using the CHG and inform your nurse when you arrive at Short Stay. Do not shave (including legs and  underarms) for at least 48 hours prior to the first CHG shower.  You may shave your face. Please follow these instructions carefully:   1.  Shower with CHG Soap the night before surgery and the  morning of Surgery.   2.  If you choose to wash your hair, wash your hair first as usual with your  normal  Shampoo.   3.  After you shampoo, rinse your hair and body thoroughly to remove the  shampoo.  4.  Use CHG as you would any other liquid soap.  You can apply chg directly  to the skin and wash . Gently wash with scrungie or clean wascloth    5.  Apply the CHG Soap to your body ONLY FROM THE NECK DOWN.   Do not use on open                           Wound or open sores. Avoid contact with eyes, ears mouth and genitals (private parts).                        Genitals (private parts) with your normal soap.              6.  Wash thoroughly, paying special attention to the area where your surgery  will be performed.   7.  Thoroughly rinse your body with warm water from the neck down.   8.  DO NOT shower/wash with your normal soap after using and rinsing off  the CHG Soap .                9.  Pat yourself dry with a clean towel.             10.  Wear clean pajamas.             11.  Place clean sheets on your bed the night of your first shower and do not  sleep with pets.  Day of Surgery : Do not apply any lotions/deodorants the morning of surgery.  Please wear clean clothes to the hospital/surgery center.  FAILURE TO FOLLOW THESE INSTRUCTIONS MAY RESULT IN THE CANCELLATION OF YOUR SURGERY    PATIENT SIGNATURE_________________________________  ______________________________________________________________________

## 2014-05-05 ENCOUNTER — Ambulatory Visit (HOSPITAL_COMMUNITY): Payer: BLUE CROSS/BLUE SHIELD | Admitting: Anesthesiology

## 2014-05-05 ENCOUNTER — Ambulatory Visit (HOSPITAL_COMMUNITY): Payer: BLUE CROSS/BLUE SHIELD

## 2014-05-05 ENCOUNTER — Encounter (HOSPITAL_COMMUNITY)
Admission: RE | Disposition: A | Discharge status: Home / Self Care | Payer: Self-pay | Source: Ambulatory Visit | Attending: Urology

## 2014-05-05 ENCOUNTER — Encounter (HOSPITAL_COMMUNITY): Payer: Self-pay | Admitting: *Deleted

## 2014-05-05 ENCOUNTER — Ambulatory Visit (HOSPITAL_COMMUNITY)
Admission: RE | Admit: 2014-05-05 | Discharge: 2014-05-05 | Disposition: A | Discharge status: Home / Self Care | Payer: BLUE CROSS/BLUE SHIELD | Source: Ambulatory Visit | Attending: Urology | Admitting: Urology

## 2014-05-05 DIAGNOSIS — Z87891 Personal history of nicotine dependence: Secondary | ICD-10-CM | POA: Insufficient documentation

## 2014-05-05 DIAGNOSIS — N201 Calculus of ureter: Secondary | ICD-10-CM | POA: Insufficient documentation

## 2014-05-05 DIAGNOSIS — Z79899 Other long term (current) drug therapy: Secondary | ICD-10-CM | POA: Insufficient documentation

## 2014-05-05 DIAGNOSIS — K219 Gastro-esophageal reflux disease without esophagitis: Secondary | ICD-10-CM | POA: Insufficient documentation

## 2014-05-05 DIAGNOSIS — N209 Urinary calculus, unspecified: Secondary | ICD-10-CM

## 2014-05-05 DIAGNOSIS — N23 Unspecified renal colic: Secondary | ICD-10-CM | POA: Insufficient documentation

## 2014-05-05 DIAGNOSIS — Z791 Long term (current) use of non-steroidal anti-inflammatories (NSAID): Secondary | ICD-10-CM | POA: Insufficient documentation

## 2014-05-05 DIAGNOSIS — N200 Calculus of kidney: Secondary | ICD-10-CM

## 2014-05-05 HISTORY — PX: CYSTOSCOPY WITH RETROGRADE PYELOGRAM, URETEROSCOPY AND STENT PLACEMENT: SHX5789

## 2014-05-05 HISTORY — PX: HOLMIUM LASER APPLICATION: SHX5852

## 2014-05-05 SURGERY — CYSTOURETEROSCOPY, WITH RETROGRADE PYELOGRAM AND STENT INSERTION
Anesthesia: General | Laterality: Left

## 2014-05-05 MED ORDER — CIPROFLOXACIN IN D5W 400 MG/200ML IV SOLN
400.0000 mg | INTRAVENOUS | Status: AC
  Administered 2014-05-05: 400 mg via INTRAVENOUS

## 2014-05-05 MED ORDER — PROPOFOL 10 MG/ML IV BOLUS
INTRAVENOUS | Status: AC
  Filled 2014-05-05: qty 20

## 2014-05-05 MED ORDER — MIDAZOLAM HCL 5 MG/5ML IJ SOLN
INTRAMUSCULAR | Status: DC | PRN
  Administered 2014-05-05 (×2): 1 mg via INTRAVENOUS

## 2014-05-05 MED ORDER — PHENAZOPYRIDINE HCL 200 MG PO TABS: 200.0000 mg | ORAL_TABLET | Freq: Three times a day (TID) | ORAL | Status: DC | PRN

## 2014-05-05 MED ORDER — BELLADONNA ALKALOIDS-OPIUM 16.2-60 MG RE SUPP
RECTAL | Status: AC
  Filled 2014-05-05: qty 1

## 2014-05-05 MED ORDER — ONDANSETRON HCL 4 MG/2ML IJ SOLN
INTRAMUSCULAR | Status: AC
  Filled 2014-05-05: qty 2

## 2014-05-05 MED ORDER — ACETAMINOPHEN 10 MG/ML IV SOLN
1000.0000 mg | Freq: Four times a day (QID) | INTRAVENOUS | Status: DC
  Administered 2014-05-05: 1000 mg via INTRAVENOUS
  Filled 2014-05-05 (×4): qty 100

## 2014-05-05 MED ORDER — MEPERIDINE HCL 50 MG/ML IJ SOLN: 6.2500 mg | INTRAMUSCULAR | Status: DC | PRN

## 2014-05-05 MED ORDER — FENTANYL CITRATE 0.05 MG/ML IJ SOLN
INTRAMUSCULAR | Status: AC
  Filled 2014-05-05: qty 2

## 2014-05-05 MED ORDER — FENTANYL CITRATE 0.05 MG/ML IJ SOLN
INTRAMUSCULAR | Status: DC | PRN
  Administered 2014-05-05 (×2): 25 ug via INTRAVENOUS
  Administered 2014-05-05 (×2): 50 ug via INTRAVENOUS

## 2014-05-05 MED ORDER — CIPROFLOXACIN IN D5W 400 MG/200ML IV SOLN
INTRAVENOUS | Status: AC
  Filled 2014-05-05: qty 200

## 2014-05-05 MED ORDER — PROPOFOL 10 MG/ML IV BOLUS
INTRAVENOUS | Status: DC | PRN
  Administered 2014-05-05: 150 mg via INTRAVENOUS

## 2014-05-05 MED ORDER — MIDAZOLAM HCL 2 MG/2ML IJ SOLN
INTRAMUSCULAR | Status: AC
  Filled 2014-05-05: qty 2

## 2014-05-05 MED ORDER — LACTATED RINGERS IV SOLN
INTRAVENOUS | Status: DC
  Administered 2014-05-05: 1000 mL via INTRAVENOUS
  Administered 2014-05-05: 14:00:00 via INTRAVENOUS

## 2014-05-05 MED ORDER — DEXAMETHASONE SODIUM PHOSPHATE 10 MG/ML IJ SOLN
INTRAMUSCULAR | Status: AC
  Filled 2014-05-05: qty 1

## 2014-05-05 MED ORDER — KETOROLAC TROMETHAMINE 30 MG/ML IJ SOLN
30.0000 mg | INTRAMUSCULAR | Status: AC
  Administered 2014-05-05: 30 mg via INTRAVENOUS
  Filled 2014-05-05: qty 1

## 2014-05-05 MED ORDER — SODIUM CHLORIDE 0.9 % IJ SOLN
INTRAMUSCULAR | Status: AC
  Filled 2014-05-05: qty 10

## 2014-05-05 MED ORDER — ACETAMINOPHEN-CODEINE #3 300-30 MG PO TABS: 1.0000 | ORAL_TABLET | ORAL | Status: DC | PRN

## 2014-05-05 MED ORDER — LIDOCAINE HCL (CARDIAC) 20 MG/ML IV SOLN
INTRAVENOUS | Status: AC
  Filled 2014-05-05: qty 5

## 2014-05-05 MED ORDER — DEXAMETHASONE SODIUM PHOSPHATE 10 MG/ML IJ SOLN
INTRAMUSCULAR | Status: DC | PRN
  Administered 2014-05-05: 10 mg via INTRAVENOUS

## 2014-05-05 MED ORDER — SODIUM CHLORIDE 0.9 % IR SOLN
Status: DC | PRN
  Administered 2014-05-05: 4000 mL

## 2014-05-05 MED ORDER — ONDANSETRON HCL 4 MG/2ML IJ SOLN
INTRAMUSCULAR | Status: DC | PRN
  Administered 2014-05-05: 4 mg via INTRAVENOUS

## 2014-05-05 MED ORDER — HYDROMORPHONE HCL 1 MG/ML IJ SOLN
0.2500 mg | INTRAMUSCULAR | Status: DC | PRN
  Administered 2014-05-05 (×3): 0.5 mg via INTRAVENOUS
  Administered 2014-05-05: 0.25 mg via INTRAVENOUS

## 2014-05-05 MED ORDER — DIPHENHYDRAMINE HCL 50 MG/ML IJ SOLN
INTRAMUSCULAR | Status: AC
  Filled 2014-05-05: qty 1

## 2014-05-05 MED ORDER — DIPHENHYDRAMINE HCL 50 MG/ML IJ SOLN
12.5000 mg | Freq: Four times a day (QID) | INTRAMUSCULAR | Status: DC | PRN
  Administered 2014-05-05: 12.5 mg via INTRAVENOUS

## 2014-05-05 MED ORDER — PROMETHAZINE HCL 25 MG/ML IJ SOLN
6.2500 mg | INTRAMUSCULAR | Status: DC | PRN
  Administered 2014-05-05: 6.25 mg via INTRAVENOUS
  Filled 2014-05-05: qty 1

## 2014-05-05 MED ORDER — TROSPIUM CHLORIDE ER 60 MG PO CP24: 60.0000 mg | ORAL_CAPSULE | Freq: Every day | ORAL | Status: DC

## 2014-05-05 MED ORDER — HYDROMORPHONE HCL 1 MG/ML IJ SOLN
INTRAMUSCULAR | Status: DC
Start: 2014-05-05 — End: 2014-05-05
  Filled 2014-05-05: qty 1

## 2014-05-05 MED ORDER — PHENAZOPYRIDINE HCL 200 MG PO TABS
200.0000 mg | ORAL_TABLET | ORAL | Status: AC
  Administered 2014-05-05: 200 mg via ORAL
  Filled 2014-05-05: qty 1

## 2014-05-05 MED ORDER — LIDOCAINE HCL 1 % IJ SOLN
INTRAMUSCULAR | Status: DC | PRN
  Administered 2014-05-05: 80 mg via INTRADERMAL

## 2014-05-05 MED ORDER — LIDOCAINE HCL 2 % EX GEL
CUTANEOUS | Status: AC
  Filled 2014-05-05: qty 10

## 2014-05-05 MED ORDER — IOHEXOL 300 MG/ML  SOLN
INTRAMUSCULAR | Status: DC | PRN
  Administered 2014-05-05: 20 mL via URETHRAL

## 2014-05-05 SURGICAL SUPPLY — 21 items
BAG URO CATCHER STRL LF (DRAPE) ×3 IMPLANT
CATH URET 5FR 28IN OPEN ENDED (CATHETERS) ×3 IMPLANT
CLOTH BEACON ORANGE TIMEOUT ST (SAFETY) ×3 IMPLANT
EXTRACTOR STONE NITINOL NGAGE (UROLOGICAL SUPPLIES) ×3 IMPLANT
FIBER LASER FLEXIVA 1000 (UROLOGICAL SUPPLIES) IMPLANT
FIBER LASER FLEXIVA 200 (UROLOGICAL SUPPLIES) ×3 IMPLANT
FIBER LASER FLEXIVA 365 (UROLOGICAL SUPPLIES) IMPLANT
FIBER LASER FLEXIVA 550 (UROLOGICAL SUPPLIES) IMPLANT
FIBER LASER TRAC TIP (UROLOGICAL SUPPLIES) ×3 IMPLANT
GLOVE BIOGEL PI IND STRL 7.5 (GLOVE) ×3 IMPLANT
GLOVE BIOGEL PI INDICATOR 7.5 (GLOVE) ×6
GOWN STRL REUS W/TWL XL LVL3 (GOWN DISPOSABLE) ×6 IMPLANT
GUIDEWIRE ANG ZIPWIRE 038X150 (WIRE) IMPLANT
GUIDEWIRE STR DUAL SENSOR (WIRE) ×6 IMPLANT
MANIFOLD NEPTUNE II (INSTRUMENTS) ×3 IMPLANT
PACK CYSTO (CUSTOM PROCEDURE TRAY) ×3 IMPLANT
SHEATH ACCESS URETERAL 38CM (SHEATH) ×3 IMPLANT
SHIELD EYE BINOCULAR (MISCELLANEOUS) IMPLANT
STENT URET 6FRX24 CONTOUR (STENTS) ×3 IMPLANT
TUBING CONNECTING 10 (TUBING) ×2 IMPLANT
TUBING CONNECTING 10' (TUBING) ×1

## 2014-05-05 NOTE — Transfer of Care (Signed)
Immediate Anesthesia Transfer of Care Note  Patient: Erin Jacobson  Procedure(s) Performed: Procedure(s): LEFT  URETEROSCOPY,  RETROGRADE PYELOGRAM,  AND STENT PLACEMENT, STONE REMOVAL (Left) HOLMIUM LASER APPLICATION (Left)  Patient Location: PACU  Anesthesia Type:General  Level of Consciousness: awake, sedated and patient cooperative  Airway & Oxygen Therapy: Patient Spontanous Breathing and Patient connected to face mask oxygen  Post-op Assessment: Report given to RN and Post -op Vital signs reviewed and stable  Post vital signs: Reviewed and stable  Last Vitals:  Filed Vitals:   05/05/14 1005  BP: 121/72  Pulse: 101  Temp: 36.7 C  Resp: 18    Complications: No apparent anesthesia complications

## 2014-05-05 NOTE — Anesthesia Preprocedure Evaluation (Addendum)
Anesthesia Evaluation  Patient identified by MRN, date of birth, ID band Patient awake    Reviewed: Allergy & Precautions, NPO status , Patient's Chart, lab work & pertinent test results  History of Anesthesia Complications (+) Family history of anesthesia reaction  Airway Mallampati: II  TM Distance: >3 FB Neck ROM: Full    Dental no notable dental hx.    Pulmonary neg pulmonary ROS,  breath sounds clear to auscultation  Pulmonary exam normal       Cardiovascular negative cardio ROS  Rhythm:Regular Rate:Normal     Neuro/Psych negative neurological ROS  negative psych ROS   GI/Hepatic Neg liver ROS, GERD-  ,  Endo/Other  negative endocrine ROS  Renal/GU Renal diseasenegative Renal ROS     Musculoskeletal negative musculoskeletal ROS (+)   Abdominal   Peds  Hematology negative hematology ROS (+)   Anesthesia Other Findings   Reproductive/Obstetrics negative OB ROS                            Anesthesia Physical Anesthesia Plan  ASA: II  Anesthesia Plan: General   Post-op Pain Management:    Induction: Intravenous  Airway Management Planned: LMA  Additional Equipment: None  Intra-op Plan:   Post-operative Plan: Extubation in OR  Informed Consent: I have reviewed the patients History and Physical, chart, labs and discussed the procedure including the risks, benefits and alternatives for the proposed anesthesia with the patient or authorized representative who has indicated his/her understanding and acceptance.   Dental advisory given  Plan Discussed with: CRNA  Anesthesia Plan Comments:        Anesthesia Quick Evaluation

## 2014-05-05 NOTE — Discharge Instructions (Signed)
DISCHARGE INSTRUCTIONS FOR KIDNEY STONE/URETERAL STENT   MEDICATIONS:  1.  Resume all your other meds from home - except do not take any extra narcotic pain meds that you may have at home.  2. Trospium is to prevent bladder spasms and help reduce urinary frequency. 3. Pyridium is to help with the burning/stinging when you urinate. 4. Tylenol with codeine is for moderate/severe pain, otherwise taking upto  every 6 hours of plainTylenol will help treat your pain.  Do not take both at the same time.   ACTIVITY:  1. No strenuous activity x 1week  2. No driving while on narcotic pain medications  3. Drink plenty of water  4. Continue to walk at home - you can still get blood clots when you are at home, so keep active, but don't over do it.  5. May return to work/school tomorrow or when you feel ready   BATHING:  1. You can shower and we recommend daily showers    SIGNS/SYMPTOMS TO CALL:  Please call us if you have a fever greater than 101.5, uncontrolled nausea/vomiting, uncontrolled pain, dizziness, unable to urinate, bloody urine, chest pain, shortness of breath, leg swelling, leg pain, redness around wound, drainage from wound, or any other concerns or questions.   You can reach [place] at [phone].   FOLLOW-UP:  1. You have an appointment in 2 weeks for stent removal.      General Anesthesia, Care After Refer to this sheet in the next few weeks. These instructions provide you with information on caring for yourself after your procedure. Your health care provider may also give you more specific instructions. Your treatment has been planned according to current medical practices, but problems sometimes occur. Call your health care provider if you have any problems or questions after your procedure. WHAT TO EXPECT AFTER THE PROCEDURE After the procedure, it is typical to experience:  Sleepiness.  Nausea and vomiting. HOME CARE INSTRUCTIONS  For the first 24 hours after  general anesthesia:  Have a responsible person with you.  Do not drive a car. If you are alone, do not take public transportation.  Do not drink alcohol.  Do not take medicine that has not been prescribed by your health care provider.  Do not sign important papers or make important decisions.  You may resume a normal diet and activities as directed by your health care provider.  Change bandages (dressings) as directed.  If you have questions or problems that seem related to general anesthesia, call the hospital and ask for the anesthetist or anesthesiologist on call. SEEK MEDICAL CARE IF:  You have nausea and vomiting that continue the day after anesthesia.  You develop a rash. SEEK IMMEDIATE MEDICAL CARE IF:   You have difficulty breathing.  You have chest pain.  You have any allergic problems. Document Released: 06/16/2000 Document Revised: 03/15/2013 Document Reviewed: 09/23/2012 [facility] Patient Information 2015 [place], [place]. This information is not intended to replace advice given to you by your health care provider. Make sure you discuss any questions you have with your health care provider.    Indwelling Urinary Catheter Care You have been given a flexible tube (catheter) used to drain the bladder. Catheters are often used when a person has difficulty urinating due to blockage, bleeding, infection, or inability to control bladder or bowel movements (incontinence). A catheter requires daily care to prevent infection and blockage. HOME CARE INSTRUCTIONS  Do the following to reduce the risk of infection. Antibiotic medicines cannot prevent  infections. Limit the number of bacteria entering your bladder  Wash your hands for 2 minutes with soapy water before and after handling the catheter.  Wash your bottom and the entire catheter twice daily, as well as after each bowel movement. Wash the tip of the penis or just above the vaginal opening with soap and warm water,  rinse, and then wash the rectal area. Always wash from front to back.  When changing from the leg bag to overnight bag or from the overnight bag to leg bag, thoroughly clean the end of the catheter where it connects to the tubing with an alcohol wipe.  Clean the leg bag and overnight bag daily after use. Replace your drainage bags weekly.  Always keep the tubing and bag below the level of your bladder. This allows your urine to drain properly. Lifting the bag or tubing above the level of your bladder will cause dirty urine to flow back into your bladder. If you must briefly lift the bag higher than your bladder, pinch the catheter or tubing to prevent backflow.  Drink enough water and fluids to keep your urine clear or pale yellow, or as directed by your caregiver. This will flush bacteria out of the bladder. Protect tissues from injury  Attach the catheter to your leg so there is no tension on the catheter. Use adhesive tape or a leg strap. If you are using adhesive tape, remove any sticky residue left behind by the previous tape you used.  Place your leg bag on your lower leg. Fasten the straps securely and comfortably.  Do not remove the catheter yourself unless you have been instructed how to do so. Keep the urinary pathway open  Check throughout the day to be sure your catheter is working and urine is draining freely. Make sure the tubing does not become kinked.  Do not let the drainage bag overfill. SEEK IMMEDIATE MEDICAL CARE IF:   The catheter becomes blocked. Urine is not draining.  Urine is leaking.  You have any pain.  You have a fever. Document Released: 03/10/2005 Document Revised: 02/25/2012 Document Reviewed: 08/09/2009 [facility] Patient Information 2015 [place], [place]. This information is not intended to replace advice given to you by your health care provider. Make sure you discuss any questions you have with your health care provider.

## 2014-05-05 NOTE — H&P (Signed)
Reason For Visit f/u for left UPJ stone   History of Present Illness 31 F seen in f/u for a 10mm left UPJ stone. She was initially seen by Dr. Brunilda Payor who thought that she may have a UPJ obstruction leading to the formation of her stone. She has intermittent renal colic with associated nausea. No fevers/chills.   Past Medical History Problems  1. History of nephrolithiasis (Z61.096)  Surgical History Problems  1. History of Appendectomy 2. History of Knee Surgery  Current Meds 1. Ciprofloxacin HCl - 500 MG Oral Tablet;  Therapy: (Recorded:04Nov2015) to Recorded 2. Naprelan 500 MG Oral Tablet Extended Release 24 Hour;  Therapy: (Recorded:04Nov2015) to Recorded 3. Ondansetron 8 MG Oral Tablet Dispersible;  Therapy: (Recorded:04Nov2015) to Recorded 4. TraMADol HCl - 50 MG Oral Tablet;  Therapy: (Recorded:04Nov2015) to Recorded  Allergies Medication  1. No Known Drug Allergies  Family History Problems  1. Family history of systemic lupus erythematosus (Z82.69) : Mother  Social History Problems  1. Alcohol use (F10.99)   1-2 per month 2. Father alive and healthy   58 3. Former smoker [phone]) 4. History of smoking (Z87.898) 5. Mother alive and healthy   59 6. Number of children   1 son 7. Single  Review of Systems No changes in pts bowel habits, neurological changes, or progressive lower urinary tract symptoms.      Vitals Vital Signs [Data Includes: Last 1 Day]  Recorded: 03Dec2015 08:42AM  Blood Pressure: 115 / 74 Temperature: 98.1 F Heart Rate: 83  Physical Exam Constitutional: Well nourished and well developed . No acute distress.  Pulmonary: No respiratory distress and normal respiratory rhythm and effort.  Cardiovascular: Heart rate and rhythm are normal . No peripheral edema.  Abdomen: The abdomen is soft and nontender. No masses are palpated. No CVA tenderness. No hernias are palpable. No hepatosplenomegaly noted.  Skin: Normal skin turgor, no  visible rash and no visible skin lesions.  Neuro/Psych:. Mood and affect are appropriate.    Results/Data Urine [Data Includes: Last 1 Day]   03Dec2015  COLOR YELLOW   APPEARANCE CLEAR   SPECIFIC GRAVITY 1.030   pH 5.5   GLUCOSE NEG mg/dL  BILIRUBIN NEG   KETONE NEG mg/dL  BLOOD SMALL   PROTEIN TRACE mg/dL  UROBILINOGEN 0.2 mg/dL  NITRITE NEG   LEUKOCYTE ESTERASE NEG   SQUAMOUS EPITHELIAL/HPF FEW   WBC 11-20 WBC/hpf  RBC 3-6 RBC/hpf  BACTERIA FEW   CRYSTALS NONE SEEN   CASTS NONE SEEN   Other MUCUS NOTED    The following images/tracing/specimen were independently visualized: .   CT ab/pelv obtained in the ED in October shows a 10mm stone in the left renal pelvis with question of a high insertion of the ureter into the renal pelvis. The renal parenchyma is well perserved and there is miminal hydro.  Urinalysis today demonstrates pyuria and microscopic hematuria, urine cultures be sent     Assessment Patient has a 10 mm stone in the left UPJ. Question of whether she has a UPJ obstruction.   Plan Health Maintenance  1. UA With REFLEX; [Do Not Release]; Status:Complete;   Done: 03Dec2015 08:29AM  Discussion/Summary i recommended that we investigate her UPJ to r/o obstruction with a retrograde pyelogram and simultaneously ureteroscopy to attempt to treat her stone with laser lithotripsy. I have outlined the procedure in detail as well as the associated risks. She understands that I may not be able to get into the renal pelvis and that if this occurred  I would leave a stent and she may need a staged procedure as a result. After I had addressed her concerns and questions, she has agreed to proceed.

## 2014-05-05 NOTE — Interval H&P Note (Signed)
History and Physical Interval Note:  05/05/2014 12:10 PM  Erin Jacobson  has presented today for surgery, with the diagnosis of LEFT UPJ STONE   The various methods of treatment have been discussed with the patient and family. After consideration of risks, benefits and other options for treatment, the patient has consented to  Procedure(s): LEFT  URETEROSCOPY,  RETROGRADE PYELOGRAM,  AND STENT PLACEMENT (Left) HOLMIUM LASER APPLICATION (Left) as a surgical intervention .  The patient's history has been reviewed, patient examined, no change in status, stable for surgery.  I have reviewed the patient's chart and labs.  Questions were answered to the patient's satisfaction.     Berniece SalinesHERRICK, BENJAMIN W

## 2014-05-05 NOTE — Anesthesia Postprocedure Evaluation (Signed)
Anesthesia Post Note  Patient: Erin Jacobson  Procedure(s) Performed: Procedure(s) (LRB): LEFT  URETEROSCOPY,  RETROGRADE PYELOGRAM,  AND STENT PLACEMENT, STONE REMOVAL (Left) HOLMIUM LASER APPLICATION (Left)  Anesthesia type: General  Patient location: PACU  Post pain: Pain level controlled  Post assessment: Post-op Vital signs reviewed  Last Vitals: BP 122/63 mmHg  Pulse 99  Temp(Src) 37.1 C (Oral)  Resp 14  Ht 5' (1.524 m)  Wt 137 lb (62.143 kg)  BMI 26.76 kg/m2  SpO2 97%  Post vital signs: Reviewed  Level of consciousness: sedated  Complications: No apparent anesthesia complications

## 2014-05-05 NOTE — Progress Notes (Addendum)
Paged Dr Emelda FearFerguson who is on call for Dr Marlou PorchHerrick. Patient is unable to void despite ambulation and frequent trips to bathroom . Bladder scanned for 663 ml. Orders received to place indwelling catheter. Patient to be instructed to return to [facility] on 05/08/14 for catheter removal.

## 2014-05-05 NOTE — Op Note (Signed)
Preoperative diagnosis: left UPJ calculus  Postoperative diagnosis: left ureteral calculus  Procedure:  1. Cystoscopy 2. left ureteroscopy and stone removal 3. Ureteroscopic laser lithotripsy 4. left 37F x 24 ureteral stent placement  5. left retrograde pyelography with interpretation  Surgeon: Crist FatBenjamin W. Jerzee Jerome, MD  Anesthesia: General  Complications: None  Intraoperative findings: left retrograde pyelography demonstrated a filling defect within the left renal pelvis consistent with the patient's known calculus. In addition, the patient had a narrow UPJ. There is no hydronephrosis. She had a long upper pole infundibulum.  EBL: Minimal  Specimens: 1. left ureteral calculus  Disposition of specimens: [facility] for stone analysis  Indication: Erin Jacobson is a 24 y.o.   patient with urolithiasis. After reviewing the management options for treatment, the patient elected to proceed with the above surgical procedure(s). We have discussed the potential benefits and risks of the procedure, side effects of the proposed treatment, the likelihood of the patient achieving the goals of the procedure, and any potential problems that might occur during the procedure or recuperation. Informed consent has been obtained.  Description of procedure:  The patient was taken to the operating room and general anesthesia was induced.  The patient was placed in the dorsal lithotomy position, prepped and draped in the usual sterile fashion, and preoperative antibiotics were administered. A preoperative time-out was performed.   Cystourethroscopy was performed.  The patient's urethra was examined and was normal. The bladder was then systematically examined in its entirety. There was no evidence for any bladder tumors, stones, or other mucosal pathology.    Attention then turned to the left ureteral orifice and a ureteral catheter was used to intubate the ureteral orifice.  Omnipaque  contrast was injected through the ureteral catheter and a retrograde pyelogram was performed with findings as dictated above.  A 0.38 sensor guidewire was then advanced up the left ureter into the renal pelvis under fluoroscopic guidance. The 6 Fr semirigid ureteroscope was then advanced into the ureter next to the guidewire and the calculus was identified. Once in the renal pelvis I advanced a second 0.38 sensor wire into the left renal pelvis and packed the scope out over the wire. I then dilated the ureteral orifice with the inner part of the 12/14 French ureteral access sheath. I then advanced the access sheath up to the proximal ureter. Again noted was the tight UPJ. I attempted to pass the access sheath through the UPJ, but I ultimately created a false passage. I was able to pull back and find the lumen.  The performed pyeloscopy inspecting the kidney for additional stones other than the one located at the UPJ. I then used a 200  fiber with settings of 6 Hz and 0.6 J and fragmented the stone into numerous or treatable fragments. I then spent the majority of the case chasing small fragments. By the end of the case all stone fragments had been removed that were big enough to grasp in the N-gauge basket.  The wire was then backloaded through the cystoscope and a ureteral stent was advance over the wire using Seldinger technique.  The stent was positioned appropriately under fluoroscopic and cystoscopic guidance.  The wire was then removed with an adequate stent curl noted in the renal pelvis as well as in the bladder.  The bladder was then emptied and the procedure ended.  The patient appeared to tolerate the procedure well and without complications.  The patient was able to be awakened and transferred to the  recovery unit in satisfactory condition.   Disposition: The string was removed from the stent, the patient will be scheduled for follow-up in 2 weeks for stent removal.

## 2014-05-06 ENCOUNTER — Emergency Department (HOSPITAL_COMMUNITY)
Admission: EM | Admit: 2014-05-06 | Discharge: 2014-05-07 | Disposition: A | Discharge status: Home / Self Care | Payer: BLUE CROSS/BLUE SHIELD | Attending: Emergency Medicine | Admitting: Emergency Medicine

## 2014-05-06 ENCOUNTER — Encounter (HOSPITAL_COMMUNITY): Payer: Self-pay | Admitting: Emergency Medicine

## 2014-05-06 DIAGNOSIS — R1030 Lower abdominal pain, unspecified: Secondary | ICD-10-CM | POA: Diagnosis present

## 2014-05-06 DIAGNOSIS — K5900 Constipation, unspecified: Secondary | ICD-10-CM | POA: Insufficient documentation

## 2014-05-06 DIAGNOSIS — G8918 Other acute postprocedural pain: Secondary | ICD-10-CM | POA: Insufficient documentation

## 2014-05-06 DIAGNOSIS — Z8719 Personal history of other diseases of the digestive system: Secondary | ICD-10-CM | POA: Diagnosis not present

## 2014-05-06 DIAGNOSIS — Z87442 Personal history of urinary calculi: Secondary | ICD-10-CM | POA: Insufficient documentation

## 2014-05-06 DIAGNOSIS — R140 Abdominal distension (gaseous): Secondary | ICD-10-CM | POA: Insufficient documentation

## 2014-05-06 DIAGNOSIS — Z792 Long term (current) use of antibiotics: Secondary | ICD-10-CM | POA: Insufficient documentation

## 2014-05-06 NOTE — ED Notes (Addendum)
Pt. reports urinary urgency / low abdominal pain onset today , s/p ureteroscopy with stent placement ( kidney stone)  yesterday by Dr. Robby SermonHendricks . Pt.has a foley catheter.

## 2014-05-06 NOTE — ED Provider Notes (Signed)
CSN: [id number]     Arrival date & time 05/06/14  2158 History  This chart was scribed for Loren Racer, MD by Abel Presto, ED Scribe. This patient was seen in room B18C/B18C and the patient's care was started at 12:20 AM.    No chief complaint on file.    The history is provided by the patient. No language interpreter was used.   HPI Comments: Erin Jacobson is a 24 y.o. female who presents to the Emergency Department complaining of post-op pain. Pt notes sharp lower abdominal pain and gassiness. Pt notes pain disturbed her sleep. Uncontrolled with Tylenol with codeine.  Pt had renal calculi removed on 02/12 with lithotripsy and stent placement. Post-operatively pt was unable to void and urinary cath placed. Pt notes associated urgency. Pt spoke with Dr. Marlou Porch and was advised to come to ED. No fevers or chills. No nausea or vomiting. No bowel movement since procedure.  Past Medical History  Diagnosis Date  . Complication of anesthesia   . Family history of adverse reaction to anesthesia     mother has n/v post op  . GERD (gastroesophageal reflux disease)     no current meds for reflux  . Kidney stone    Past Surgical History  Procedure Laterality Date  . Appendectomy  04/01/2004    ex. lap.  . Knee surgery     Family History  Problem Relation Age of Onset  . Anesthesia problems Mother     post-op N/V, hard to wake up   History  Substance Use Topics  . Smoking status: Never Smoker   . Smokeless tobacco: Never Used  . Alcohol Use: Yes     Comment: occasional   OB History    No data available     Review of Systems  Constitutional: Negative for fever and chills.  Respiratory: Negative for shortness of breath.   Cardiovascular: Negative for chest pain.  Gastrointestinal: Positive for abdominal pain, constipation and abdominal distention. Negative for nausea, vomiting and diarrhea.  Genitourinary: Positive for urgency. Negative for dysuria and flank pain.   Musculoskeletal: Negative for back pain, neck pain and neck stiffness.  Skin: Negative for rash.  All other systems reviewed and are negative.     Allergies  Review of patient's allergies indicates no known allergies.  Home Medications   Prior to Admission medications   Medication Sig Start Date End Date Taking? Authorizing Provider  acetaminophen-codeine (TYLENOL #3) 300-30 MG per tablet Take 1 tablet by mouth every 4 (four) hours as needed. Patient taking differently: Take 1 tablet by mouth every 4 (four) hours as needed (pain).  05/05/14  Yes Crist Fat, MD  Multiple Vitamin (MULTIVITAMIN) LIQD Take 5 mLs by mouth daily. Nutraburst multivitamin   Yes Historical Provider, MD  phenazopyridine (PYRIDIUM) 200 MG tablet Take 1 tablet (200 mg total) by mouth 3 (three) times daily as needed for pain. 05/05/14  Yes Crist Fat, MD  Trospium Chloride 60 MG CP24 Take 1 capsule (60 mg total) by mouth daily. 05/05/14  Yes Crist Fat, MD  metroNIDAZOLE (METROGEL) 0.75 % vaginal gel Place 1 Applicatorful vaginally at bedtime. 5 days Patient not taking: Reported on 05/06/2014 03/20/14   Rodolph Bong, MD  oxyCODONE-acetaminophen (PERCOCET) 5-325 MG per tablet Take 1-2 tablets by mouth every 4 (four) hours as needed. 05/07/14   Loren Racer, MD  polyethylene glycol [facility] Ethelene Hal) packet Take 17 g by mouth daily. 05/07/14   Loren Racer, MD  traMADol (  ULTRAM) 50 MG tablet Take 1 tablet (50 mg total) by mouth every 6 (six) hours as needed. Patient not taking: Reported on 05/06/2014 01/21/14   Olivia Mackielga M Otter, MD   BP 105/60 mmHg  Pulse 97  Temp(Src) 98.4 F (36.9 C) (Oral)  Resp 14  SpO2 98%  LMP 04/18/2014 Physical Exam  Constitutional: She is oriented to person, place, and time. She appears well-developed and well-nourished. No distress.  Patient is very well-appearing in no apparent distress.  HENT:  Head: Normocephalic and atraumatic.  Mouth/Throat: Oropharynx is  clear and moist.  Eyes: Conjunctivae and EOM are normal. Pupils are equal, round, and reactive to light.  Neck: Normal range of motion. Neck supple.  Cardiovascular: Normal rate and regular rhythm.   Pulmonary/Chest: Effort normal and breath sounds normal. No respiratory distress. She has no wheezes. She has no rales.  Abdominal: Soft. She exhibits distension (mild abdominal distention). She exhibits no mass. There is tenderness (very mild lower abdominal tenderness with palpation. No rebound or guarding.). There is no rebound and no guarding.  Decreased bowel sounds  Musculoskeletal: Normal range of motion. She exhibits no edema or tenderness.  No CVA tenderness.  Neurological: She is alert and oriented to person, place, and time.  Skin: Skin is warm and dry. No rash noted. No erythema.  Psychiatric: She has a normal mood and affect. Her behavior is normal.  Nursing note and vitals reviewed.   ED Course  Procedures (including critical care time) DIAGNOSTIC STUDIES: Oxygen Saturation is 98% on room air, normal by my interpretation.    COORDINATION OF CARE: 12:24 AM Discussed treatment plan with patient at beside, the patient agrees with the plan and has no further questions at this time.   Labs Review Labs Reviewed  URINALYSIS, ROUTINE W REFLEX MICROSCOPIC - Abnormal; Notable for the following:    Color, Urine RED (*)    APPearance CLOUDY (*)    Specific Gravity, Urine >1.030 (*)    Hgb urine dipstick LARGE (*)    Nitrite POSITIVE (*)    All other components within normal limits  URINE MICROSCOPIC-ADD ON    Imaging Review Dg Abd 1 View  05/07/2014   CLINICAL DATA:  Recent placement of left ureteral stent. Pelvic and lower abdominal pain for 1 day. Initial encounter.  EXAM: ABDOMEN - 1 VIEW  COMPARISON:  CT of the abdomen and pelvis from 01/21/2014, and fluoroscopic images during placement of left ureteral stent from 05/05/2014  FINDINGS: The patient's left ureteral stent is noted  in unchanged position. The previously noted left renal stone is not well characterized on this study.  The visualized bowel gas pattern is unremarkable. Scattered air and stool filled loops of colon are seen; no abnormal dilatation of small bowel loops is seen to suggest small bowel obstruction. No free intra-abdominal air is identified, though evaluation for free air is limited on a single supine view. Clips are seen overlying the right hemipelvis.  The visualized osseous structures are within normal limits; the sacroiliac joints are unremarkable in appearance. The visualized left lung base is essentially clear; the right lung base is not well characterized.  IMPRESSION: Left ureteral stents noted in unchanged position. Unremarkable bowel gas pattern; no free intra-abdominal air seen.   Electronically Signed   By: Roanna RaiderJeffery  Chang M.D.   On: 05/07/2014 00:59   Dg Abd 1 View  05/05/2014   CLINICAL DATA:  Stone removal.  EXAM: ABDOMEN - 1 VIEW  COMPARISON:  CT 01/21/2014.  FINDINGS:  Left double-J ureteral stent noted in good anatomic position. Filling defect in the left renal pelvis suggesting stone cannot be excluded. Fluoroscopic time 2 minutes 15 seconds. Five images obtained.  IMPRESSION: Left double-J ureteral stent in good anatomic position.   Electronically Signed   By: Maisie Fus  Register   On: 05/05/2014 16:26   Dg C-arm 61-120 Min-no Report  05/05/2014   CLINICAL DATA: surgery   C-ARM 61-120 MINUTES  Fluoroscopy was utilized by the requesting physician.  No radiographic  interpretation.      EKG Interpretation None      MDM   Final diagnoses:  Postoperative pain    I personally performed the services described in this documentation, which was scribed in my presence. The recorded information has been reviewed and is accurate.  Patient states she is feeling better after medication. KUB shows stent is unchanged. No obstructive pattern of bowel gas. We'll prescribe MiraLAX and Percocet. Patient  is advised to follow-up with urologist on Monday. Return precautions given.    Loren Racer, MD 05/07/14 [phone]

## 2014-05-07 ENCOUNTER — Emergency Department (HOSPITAL_COMMUNITY): Payer: BLUE CROSS/BLUE SHIELD

## 2014-05-07 LAB — URINALYSIS, ROUTINE W REFLEX MICROSCOPIC
BILIRUBIN URINE: NEGATIVE
Glucose, UA: NEGATIVE mg/dL
Ketones, ur: NEGATIVE mg/dL
Leukocytes, UA: NEGATIVE
Nitrite: POSITIVE — AB
PROTEIN: NEGATIVE mg/dL
UROBILINOGEN UA: 0.2 mg/dL (ref 0.0–1.0)
pH: 5 (ref 5.0–8.0)

## 2014-05-07 LAB — URINE MICROSCOPIC-ADD ON

## 2014-05-07 MED ORDER — OXYCODONE-ACETAMINOPHEN 5-325 MG PO TABS
1.0000 | ORAL_TABLET | Freq: Once | ORAL | Status: AC
  Administered 2014-05-07: 1 via ORAL
  Filled 2014-05-07: qty 1

## 2014-05-07 MED ORDER — OXYCODONE-ACETAMINOPHEN 5-325 MG PO TABS: 1.0000 | ORAL_TABLET | ORAL | Status: DC | PRN

## 2014-05-07 MED ORDER — POLYETHYLENE GLYCOL 3350 17 G PO PACK: 17.0000 g | PACK | Freq: Every day | ORAL | Status: DC

## 2014-05-07 NOTE — ED Notes (Signed)
Pt taken to Xr,

## 2014-05-07 NOTE — Discharge Instructions (Signed)
Pain Relief Preoperatively and Postoperatively °Being a good patient does not mean being a silent one. If you have questions, problems, or concerns about the pain you may feel after surgery, let your caregiver know. Patients have the right to assessment and management of pain. The treatment of pain after surgery is important to speed up recovery and return to normal activities. Severe pain after surgery, and the fear or anxiety associated with that pain, may cause extreme discomfort that: °· Prevents sleep. °· Decreases the ability to breathe deeply and cough. This can cause pneumonia or other upper airway infections. °· Causes your heart to beat faster and your blood pressure to be higher. °· Increases the risk for constipation and bloating. °· Decreases the ability of wounds to heal. °· May result in depression, increased anxiety, and feelings of helplessness. °Relief of pain before surgery is also important because it will lessen the pain after surgery. Patients who receive both pain relief before and after surgery experience greater pain relief than those who only receive pain relief after surgery. Let your caregiver know if you are having uncontrolled pain. This is very important. Pain after surgery is more difficult to manage if it is permitted to become severe, so prompt and adequate treatment of acute pain is necessary. °PAIN CONTROL METHODS °Your caregivers follow policies and procedures about the management of patient pain. These guidelines should be explained to you before surgery. Plans for pain control after surgery must be mutually decided upon and instituted with your full understanding and agreement. Do not be afraid to ask questions regarding the care you are receiving. There are many different ways your caregivers will attempt to control your pain, including the following methods. °As needed pain control °· You may be given pain medicine either through your intravenous (IV) tube, or as a pill or  liquid you can swallow. You will need to let your caregiver know when you are having pain. Then, your caregiver will give you the pain medicine ordered for you. °· Your pain medicine may make you constipated. If constipation occurs, drink more liquids if you can. Your caregiver may have you take a mild laxative. °IV patient-controlled analgesia pump (PCA pump) °· You can get your pain medicine through the IV tube which goes into your vein. You are able to control the amount of pain medicine that you get. The pain medicine flows in through an IV tube and is controlled by a pump. This pump gives you a set amount of pain medicine when you push the button hooked up to it. Nobody should push this button but you or someone specifically assigned by you to do so. It is set up to keep you from accidentally giving yourself too much pain medicine. You will be able to start using your pain pump in the recovery room after your surgery. This method can be helpful for most types of surgery. °· If you are still having too much pain, tell your caregiver. Also, tell your caregiver if you are feeling too sleepy or nauseous. °Continuous epidural pain control °· A thin, soft tube (catheter) is put into your back. Pain medicine flows through the catheter to lessen pain in the part of your body where the surgery is done. Continuous epidural pain control may work best for you if you are having surgery on your chest, abdomen, hip area, or legs. The epidural catheter is usually put into your back just before surgery. The catheter is left in until you can eat and take medicine by mouth. In most cases,   this may take 2 to 3 days. °· Giving pain medicine through the epidural catheter may help you heal faster because: °¨ Your bowel gets back to normal faster. °¨ You can get back to eating sooner. °¨ You can be up and walking sooner. °Medicine that numbs the area (local anesthetic) °· You may receive an injection of pain medicine near where the  pain is (local infiltration). °· You may receive an injection of pain medicine near the nerve that controls the sensation to a specific part of the body (peripheral nerve block). °· Medicine may be put in the spine to block pain (spinal block). °Opioids °· Moderate to moderately severe acute pain after surgery may respond to opioids. Opioids are narcotic pain medicine. Opioids are often combined with non-narcotic medicines to improve pain relief, diminish the risk of side effects, and reduce the chance of addiction. °· If you follow your caregiver's directions about taking opioids and you do not have a history of substance abuse, your risk of becoming addicted is exceptionally small. Opioids are given for short periods of time in careful doses to prevent addiction. °Other methods of pain control include: °· Steroids. °· Physical therapy. °· Heat and cold therapy. °· Compression, such as wrapping an elastic bandage around the area of pain. °· Massage. °These various ways of controlling pain may be used together. Combining different methods of pain control is called multimodal analgesia. Using this approach has many benefits, including being able to eat, move around, and leave the hospital sooner. °Document Released: 05/31/2002 Document Revised: 06/02/2011 Document Reviewed: 06/04/2010 °ExitCare® Patient Information ©2015 [employer]. This information is not intended to replace advice given to you by your health care provider. Make sure you discuss any questions you have with your health care provider. ° °

## 2014-05-07 NOTE — ED Notes (Signed)
Pt reports having kidney stone from left kidney removed yesterday and has had urgency, pain in abdomen, and pain in vaginal area. Pt is taking pyridium and reports that it is not helping her.  Pt has catheter placed.  Pt a&o.

## 2014-05-07 NOTE — ED Notes (Signed)
Pt made aware to return if symptoms worsen or if any life threatening symptoms occur.   

## 2014-05-09 ENCOUNTER — Encounter (HOSPITAL_COMMUNITY): Payer: Self-pay | Admitting: Urology

## 2014-05-26 ENCOUNTER — Other Ambulatory Visit: Payer: Self-pay | Admitting: Obstetrics and Gynecology

## 2014-08-02 ENCOUNTER — Encounter (HOSPITAL_COMMUNITY): Payer: Self-pay | Admitting: *Deleted

## 2014-08-02 ENCOUNTER — Emergency Department (HOSPITAL_COMMUNITY)
Admission: EM | Admit: 2014-08-02 | Discharge: 2014-08-02 | Disposition: A | Discharge status: Home / Self Care | Payer: BLUE CROSS/BLUE SHIELD | Attending: Emergency Medicine | Admitting: Emergency Medicine

## 2014-08-02 DIAGNOSIS — N760 Acute vaginitis: Secondary | ICD-10-CM | POA: Insufficient documentation

## 2014-08-02 DIAGNOSIS — Z87442 Personal history of urinary calculi: Secondary | ICD-10-CM | POA: Insufficient documentation

## 2014-08-02 DIAGNOSIS — B9689 Other specified bacterial agents as the cause of diseases classified elsewhere: Secondary | ICD-10-CM

## 2014-08-02 DIAGNOSIS — Z8719 Personal history of other diseases of the digestive system: Secondary | ICD-10-CM | POA: Diagnosis not present

## 2014-08-02 DIAGNOSIS — Z79899 Other long term (current) drug therapy: Secondary | ICD-10-CM | POA: Insufficient documentation

## 2014-08-02 DIAGNOSIS — Z3202 Encounter for pregnancy test, result negative: Secondary | ICD-10-CM | POA: Diagnosis not present

## 2014-08-02 DIAGNOSIS — Z711 Person with feared health complaint in whom no diagnosis is made: Secondary | ICD-10-CM

## 2014-08-02 DIAGNOSIS — Z202 Contact with and (suspected) exposure to infections with a predominantly sexual mode of transmission: Secondary | ICD-10-CM | POA: Diagnosis not present

## 2014-08-02 DIAGNOSIS — N898 Other specified noninflammatory disorders of vagina: Secondary | ICD-10-CM | POA: Diagnosis present

## 2014-08-02 DIAGNOSIS — Z720 Tobacco use: Secondary | ICD-10-CM | POA: Diagnosis not present

## 2014-08-02 LAB — URINALYSIS, ROUTINE W REFLEX MICROSCOPIC
Bilirubin Urine: NEGATIVE
Glucose, UA: NEGATIVE mg/dL
Hgb urine dipstick: NEGATIVE
Ketones, ur: NEGATIVE mg/dL
Leukocytes, UA: NEGATIVE
Nitrite: NEGATIVE
Protein, ur: NEGATIVE mg/dL
Specific Gravity, Urine: 1.036 — ABNORMAL HIGH (ref 1.005–1.030)
Urobilinogen, UA: 0.2 mg/dL (ref 0.0–1.0)
pH: 6 (ref 5.0–8.0)

## 2014-08-02 LAB — WET PREP, GENITAL
Trich, Wet Prep: NONE SEEN
WBC, Wet Prep HPF POC: NONE SEEN
Yeast Wet Prep HPF POC: NONE SEEN

## 2014-08-02 LAB — PREGNANCY, URINE: Preg Test, Ur: NEGATIVE

## 2014-08-02 MED ORDER — METRONIDAZOLE 0.75 % VA GEL: 1.0000 | Freq: Two times a day (BID) | VAGINAL | Status: DC

## 2014-08-02 MED ORDER — AZITHROMYCIN 1 G PO PACK
1.0000 g | PACK | Freq: Once | ORAL | Status: AC
  Administered 2014-08-02: 1 g via ORAL
  Filled 2014-08-02: qty 1

## 2014-08-02 MED ORDER — METRONIDAZOLE 500 MG PO TABS: 500.0000 mg | ORAL_TABLET | Freq: Two times a day (BID) | ORAL | Status: DC

## 2014-08-02 MED ORDER — CEFTRIAXONE SODIUM 250 MG IJ SOLR
250.0000 mg | Freq: Once | INTRAMUSCULAR | Status: AC
  Administered 2014-08-02: 250 mg via INTRAMUSCULAR
  Filled 2014-08-02: qty 250

## 2014-08-02 NOTE — ED Notes (Signed)
Pt reports vaginal discharge, odor, and lower abdominal pain that started today. Pt reports that she just finished her period. Pt reports recent unprotected sex denies use of birth control.

## 2014-08-02 NOTE — Discharge Instructions (Signed)
Bacterial Vaginosis Bacterial vaginosis is a vaginal infection that occurs when the normal balance of bacteria in the vagina is disrupted. It results from an overgrowth of certain bacteria. This is the most common vaginal infection in women of childbearing age. Treatment is important to prevent complications, especially in pregnant women, as it can cause a premature delivery. CAUSES  Bacterial vaginosis is caused by an increase in harmful bacteria that are normally present in smaller amounts in the vagina. Several different kinds of bacteria can cause bacterial vaginosis. However, the reason that the condition develops is not fully understood. RISK FACTORS Certain activities or behaviors can put you at an increased risk of developing bacterial vaginosis, including:  Having a new sex partner or multiple sex partners.  Douching.  Using an intrauterine device (IUD) for contraception. Women do not get bacterial vaginosis from toilet seats, bedding, swimming pools, or contact with objects around them. SIGNS AND SYMPTOMS  Some women with bacterial vaginosis have no signs or symptoms. Common symptoms include:  Grey vaginal discharge.  A fishlike odor with discharge, especially after sexual intercourse.  Itching or burning of the vagina and vulva.  Burning or pain with urination. DIAGNOSIS  Your health care provider will take a medical history and examine the vagina for signs of bacterial vaginosis. A sample of vaginal fluid may be taken. Your health care provider will look at this sample under a microscope to check for bacteria and abnormal cells. A vaginal pH test may also be done.  TREATMENT  Bacterial vaginosis may be treated with antibiotic medicines. These may be given in the form of a pill or a vaginal cream. A second round of antibiotics may be prescribed if the condition comes back after treatment.  HOME CARE INSTRUCTIONS   Only take over-the-counter or prescription medicines as  directed by your health care provider.  If antibiotic medicine was prescribed, take it as directed. Make sure you finish it even if you start to feel better.  Do not have sex until treatment is completed.  Tell all sexual partners that you have a vaginal infection. They should see their health care provider and be treated if they have problems, such as a mild rash or itching.  Practice safe sex by using condoms and only having one sex partner. SEEK MEDICAL CARE IF:   Your symptoms are not improving after 3 days of treatment.  You have increased discharge or pain.  You have a fever. MAKE SURE YOU:   Understand these instructions.  Will watch your condition.  Will get help right away if you are not doing well or get worse. FOR MORE INFORMATION  Centers for Disease Control and Prevention, Division of STD Prevention: www.cdc.gov/std American Sexual Health Association (ASHA): www.ashastd.org  Document Released: 03/10/2005 Document Revised: 12/29/2012 Document Reviewed: 10/20/2012 ExitCare Patient Information 2015 [employer]. This information is not intended to replace advice given to you by your health care provider. Make sure you discuss any questions you have with your health care provider.  

## 2014-08-03 LAB — GC/CHLAMYDIA PROBE AMP (~~LOC~~) NOT AT ARMC
Chlamydia: NEGATIVE
Neisseria Gonorrhea: NEGATIVE

## 2014-08-03 LAB — RPR: RPR Ser Ql: NONREACTIVE

## 2014-08-09 NOTE — ED Provider Notes (Signed)
CSN: [id number]     Arrival date & time 08/02/14  1831 History   First MD Initiated Contact with Patient 08/02/14 2017     Chief Complaint  Patient presents with  . Vaginal Discharge  . Abdominal Pain     (Consider location/radiation/quality/duration/timing/severity/associated sxs/prior Treatment) HPI   24 year old female with some myofascial discomfort discharge. Onset yesterday. Relatively constant. Mild, crampy lower abdominal pain. No unusual bleeding. Does not think she is pregnant. This sexually active. Unprotected intercourse. Not on birth control. No fever or chills. No presycnopal symptoms. No v/d.   Past Medical History  Diagnosis Date  . Complication of anesthesia   . Family history of adverse reaction to anesthesia     mother has n/v post op  . GERD (gastroesophageal reflux disease)     no current meds for reflux  . Kidney stone    Past Surgical History  Procedure Laterality Date  . Appendectomy  04/01/2004    ex. lap.  . Knee surgery    . Cystoscopy with retrograde pyelogram, ureteroscopy and stent placement Left 05/05/2014    Procedure: LEFT  URETEROSCOPY,  RETROGRADE PYELOGRAM,  AND STENT PLACEMENT, STONE REMOVAL;  Surgeon: Crist FatBenjamin W Herrick, MD;  Location: [facility];  Service: Urology;  Laterality: Left;  . Holmium laser application Left 05/05/2014    Procedure: HOLMIUM LASER APPLICATION;  Surgeon: Crist FatBenjamin W Herrick, MD;  Location: [facility];  Service: Urology;  Laterality: Left;   Family History  Problem Relation Age of Onset  . Anesthesia problems Mother     post-op N/V, hard to wake up   History  Substance Use Topics  . Smoking status: Current Some Day Smoker    Types: Cigars  . Smokeless tobacco: Never Used  . Alcohol Use: Yes     Comment: occasional   OB History    No data available     Review of Systems  All systems reviewed and negative, other than as noted in HPI.   Allergies  Review of patient's allergies indicates no known allergies.  Home  Medications   Prior to Admission medications   Medication Sig Start Date End Date Taking? Authorizing Provider  acetaminophen-codeine (TYLENOL #3) 300-30 MG per tablet Take 1 tablet by mouth every 4 (four) hours as needed. Patient taking differently: Take 1 tablet by mouth every 4 (four) hours as needed (pain).  05/05/14   Crist FatBenjamin W Herrick, MD  metroNIDAZOLE (FLAGYL) 500 MG tablet Take 1 tablet (500 mg total) by mouth 2 (two) times daily. 08/02/14   Raeford RazorStephen Adolpho Meenach, MD  metroNIDAZOLE (METROGEL) 0.75 % vaginal gel Place 1 Applicatorful vaginally 2 (two) times daily. For 5 days 08/02/14   Raeford RazorStephen Jillisa Harris, MD  Multiple Vitamin (MULTIVITAMIN) LIQD Take 5 mLs by mouth daily. [place] multivitamin    Historical Provider, MD  oxyCODONE-acetaminophen (PERCOCET) 5-325 MG per tablet Take 1-2 tablets by mouth every 4 (four) hours as needed. 05/07/14   Loren Raceravid Yelverton, MD  phenazopyridine (PYRIDIUM) 200 MG tablet Take 1 tablet (200 mg total) by mouth 3 (three) times daily as needed for pain. 05/05/14   Crist FatBenjamin W Herrick, MD  polyethylene glycol [place](MIRALAX / Ethelene HalGLYCOLAX) packet Take 17 g by mouth daily. 05/07/14   Loren Raceravid Yelverton, MD  traMADol (ULTRAM) 50 MG tablet Take 1 tablet (50 mg total) by mouth every 6 (six) hours as needed. Patient not taking: Reported on 05/06/2014 01/21/14   Marisa Severinlga Otter, MD  Trospium Chloride 60 MG CP24 Take 1 capsule (60 mg total) by mouth daily. 05/05/14  Crist FatBenjamin W Herrick, MD   BP 120/85 mmHg  Pulse 86  Temp(Src) 98.3 F (36.8 C) (Oral)  Resp 16  Ht 5' (1.524 m)  Wt 138 lb (62.596 kg)  BMI 26.95 kg/m2  SpO2 98%  LMP 07/25/2014 (Approximate) Physical Exam  Constitutional: She appears well-developed and well-nourished. No distress.  HENT:  Head: Normocephalic and atraumatic.  Eyes: Conjunctivae are normal. Right eye exhibits no discharge. Left eye exhibits no discharge.  Neck: Neck supple.  Cardiovascular: Normal rate, regular rhythm and normal heart sounds.  Exam reveals no  gallop and no friction rub.   No murmur heard. Pulmonary/Chest: Effort normal and breath sounds normal. No respiratory distress.  Abdominal: Soft. She exhibits no distension. There is no tenderness.  Genitourinary:  Chaperone present. Normal external female genitalia. Moderate white vaginal discharge. Cervix normal in appearance. No CMT. No significant adnexal mass or tenderness.  Musculoskeletal: She exhibits no edema or tenderness.  Neurological: She is alert.  Skin: Skin is warm and dry.  Psychiatric: She has a normal mood and affect. Her behavior is normal. Thought content normal.  Nursing note and vitals reviewed.   ED Course  Procedures (including critical care time) Labs Review Labs Reviewed  WET PREP, GENITAL - Abnormal; Notable for the following:    Clue Cells Wet Prep HPF POC FEW (*)    All other components within normal limits  URINALYSIS, ROUTINE W REFLEX MICROSCOPIC - Abnormal; Notable for the following:    Specific Gravity, Urine 1.036 (*)    All other components within normal limits  PREGNANCY, URINE  RPR  GC/CHLAMYDIA PROBE AMP ([place])    Imaging Review No results found.   EKG Interpretation None      MDM   Final diagnoses:  BV (bacterial vaginosis)  Concern about STD in female without diagnosis    24 year old female with some vaginal discomfort and discharge. Wet prep with clue cells. Will treat for BV. Requesting appear GC treatment which is provided.    Raeford RazorStephen Alazae Crymes, MD 08/09/14 1332

## 2014-09-17 ENCOUNTER — Encounter (HOSPITAL_COMMUNITY): Payer: Self-pay | Admitting: Family Medicine

## 2014-09-17 ENCOUNTER — Emergency Department (INDEPENDENT_AMBULATORY_CARE_PROVIDER_SITE_OTHER)
Admission: EM | Admit: 2014-09-17 | Discharge: 2014-09-17 | Disposition: A | Discharge status: Home / Self Care | Payer: BLUE CROSS/BLUE SHIELD | Source: Home / Self Care

## 2014-09-17 DIAGNOSIS — H6092 Unspecified otitis externa, left ear: Secondary | ICD-10-CM | POA: Diagnosis not present

## 2014-09-17 MED ORDER — IBUPROFEN 800 MG PO TABS: 800.0000 mg | ORAL_TABLET | Freq: Once | ORAL | Status: DC

## 2014-09-17 MED ORDER — BENZOCAINE 20 % MT SOLN: Freq: Once | OROMUCOSAL | Status: DC

## 2014-09-17 MED ORDER — CIPROFLOXACIN-DEXAMETHASONE 0.3-0.1 % OT SUSP: 4.0000 [drp] | Freq: Two times a day (BID) | OTIC | Status: DC

## 2014-09-17 MED ORDER — ONDANSETRON 4 MG PO TBDP: 4.0000 mg | ORAL_TABLET | Freq: Once | ORAL | Status: DC

## 2014-09-17 NOTE — ED Notes (Signed)
Pt states that she was out of town and was [place] of [place] and t hat she seen her PCP who gave her ear gtts for her left ear pain but it is not working for her she states

## 2014-09-17 NOTE — Discharge Instructions (Signed)
You have a condition called otitis externa. This is caused by a bacterial infection and yours in particular is resistant to the basic antibiotics. You need a stronger eardrop to cure this. Please take the prescription and use it as prescribed. Please follow-up with your regular doctor or an ENT doctor if your symptoms do not resolve or get worse.

## 2014-09-17 NOTE — ED Provider Notes (Signed)
CSN: [id number]     Arrival date & time 09/17/14  1840 History   None    Chief Complaint  Patient presents with  . Otalgia   (Consider location/radiation/quality/duration/timing/severity/associated sxs/prior Treatment) HPI   3 wks of L ear pain. Initially started as having a sensation of "something crawling in my ear. "Patient started on Polytrim drops 7 days ago without any real relief. Systems of ear irritation now constant and getting worse. Denies any hearing loss, ear drainage, fevers, headache, bone tenderness. No history of ear trauma.  Past Medical History  Diagnosis Date  . Complication of anesthesia   . Family history of adverse reaction to anesthesia     mother has n/v post op  . GERD (gastroesophageal reflux disease)     no current meds for reflux  . Kidney stone    Past Surgical History  Procedure Laterality Date  . Appendectomy  04/01/2004    ex. lap.  . Knee surgery    . Cystoscopy with retrograde pyelogram, ureteroscopy and stent placement Left 05/05/2014    Procedure: LEFT  URETEROSCOPY,  RETROGRADE PYELOGRAM,  AND STENT PLACEMENT, STONE REMOVAL;  Surgeon: Crist Fat, MD;  Location: [facility];  Service: Urology;  Laterality: Left;  . Holmium laser application Left 05/05/2014    Procedure: HOLMIUM LASER APPLICATION;  Surgeon: Crist Fat, MD;  Location: [facility];  Service: Urology;  Laterality: Left;   Family History  Problem Relation Age of Onset  . Anesthesia problems Mother     post-op N/V, hard to wake up   History  Substance Use Topics  . Smoking status: Current Some Day Smoker    Types: Cigars  . Smokeless tobacco: Never Used  . Alcohol Use: Yes     Comment: occasional   OB History    No data available     Review of Systems Per HPI with all other pertinent systems negative.    Allergies  Review of patient's allergies indicates no known allergies.  Home Medications   Prior to Admission medications   Medication Sig Start Date End  Date Taking? Authorizing Provider  ciprofloxacin-dexamethasone (CIPRODEX) otic suspension Place 4 drops into the right ear 2 (two) times daily. 09/17/14   Ozella Rocks, MD   BP 116/74 mmHg  Pulse 88  Temp(Src) 98 F (36.7 C) (Oral)  Resp 16  SpO2 100%  LMP 08/21/2014 Physical Exam Physical Exam  Constitutional: oriented to person, place, and time. appears well-developed and well-nourished. No distress.  HENT:  Left external ear canal with significant injection and skin maceration primarily from the 3 to 9:00 positions. TM normal. Right ear canal and TM normal. Head: Normocephalic and atraumatic.  Eyes: EOMI. PERRL.  Neck: Normal range of motion.  Cardiovascular: RRR, no m/r/g, 2+ distal pulses,  Pulmonary/Chest: Effort normal and breath sounds normal. No respiratory distress.  Abdominal: Soft. Bowel sounds are normal. NonTTP, no distension.  Musculoskeletal: Normal range of motion. Non ttp, no effusion.  Neurological: alert and oriented to person, place, and time.  Skin: Skin is warm. No rash noted. non diaphoretic.  Psychiatric: normal mood and affect. behavior is normal. Judgment and thought content normal.   ED Course  Procedures (including critical care time) Labs Review Labs Reviewed - No data to display  Imaging Review No results found.   MDM   1. Otitis externa, left    Resistant otitis externa, stop Polytrim and start Ciprodex.    Ozella Rocks, MD 09/17/14 [phone]

## 2014-11-18 ENCOUNTER — Encounter (HOSPITAL_COMMUNITY): Payer: Self-pay

## 2014-11-18 ENCOUNTER — Inpatient Hospital Stay (HOSPITAL_COMMUNITY)
Admission: AD | Admit: 2014-11-18 | Discharge: 2014-11-20 | DRG: 777 | Disposition: A | Discharge status: Home / Self Care | Payer: BLUE CROSS/BLUE SHIELD | Source: Ambulatory Visit | Attending: Obstetrics & Gynecology | Admitting: Obstetrics & Gynecology

## 2014-11-18 ENCOUNTER — Inpatient Hospital Stay (HOSPITAL_COMMUNITY): Payer: BLUE CROSS/BLUE SHIELD

## 2014-11-18 DIAGNOSIS — Z87891 Personal history of nicotine dependence: Secondary | ICD-10-CM

## 2014-11-18 DIAGNOSIS — O009 Unspecified ectopic pregnancy without intrauterine pregnancy: Secondary | ICD-10-CM | POA: Diagnosis present

## 2014-11-18 DIAGNOSIS — Z87442 Personal history of urinary calculi: Secondary | ICD-10-CM

## 2014-11-18 DIAGNOSIS — O008 Other ectopic pregnancy: Secondary | ICD-10-CM | POA: Diagnosis not present

## 2014-11-18 DIAGNOSIS — O0091 Unspecified ectopic pregnancy with intrauterine pregnancy: Secondary | ICD-10-CM | POA: Diagnosis present

## 2014-11-18 DIAGNOSIS — O26899 Other specified pregnancy related conditions, unspecified trimester: Secondary | ICD-10-CM

## 2014-11-18 DIAGNOSIS — R52 Pain, unspecified: Secondary | ICD-10-CM

## 2014-11-18 DIAGNOSIS — R109 Unspecified abdominal pain: Secondary | ICD-10-CM | POA: Diagnosis not present

## 2014-11-18 LAB — URINALYSIS, ROUTINE W REFLEX MICROSCOPIC
Bilirubin Urine: NEGATIVE
Glucose, UA: NEGATIVE mg/dL
Hgb urine dipstick: NEGATIVE
Ketones, ur: NEGATIVE mg/dL
LEUKOCYTES UA: NEGATIVE
NITRITE: NEGATIVE
PH: 5.5 (ref 5.0–8.0)
Protein, ur: NEGATIVE mg/dL
Specific Gravity, Urine: >1.03 — ABNORMAL HIGH (ref 1.005–1.030)
Urobilinogen, UA: 0.2 mg/dL (ref 0.0–1.0)

## 2014-11-18 LAB — WET PREP, GENITAL
CLUE CELLS WET PREP: NONE SEEN
Trich, Wet Prep: NONE SEEN

## 2014-11-18 LAB — CBC WITH DIFFERENTIAL/PLATELET
Basophils Absolute: 0 10*3/uL (ref 0.0–0.1)
Basophils Relative: 0 % (ref 0–1)
EOS PCT: 3 % (ref 0–5)
Eosinophils Absolute: 0.3 10*3/uL (ref 0.0–0.7)
HCT: 33.3 % — ABNORMAL LOW (ref 36.0–46.0)
Hemoglobin: 11.3 g/dL — ABNORMAL LOW (ref 12.0–15.0)
LYMPHS ABS: 2.6 10*3/uL (ref 0.7–4.0)
LYMPHS PCT: 21 % (ref 12–46)
MCH: 29 pg (ref 26.0–34.0)
MCHC: 33.9 g/dL (ref 30.0–36.0)
MCV: 85.4 fL (ref 78.0–100.0)
MONO ABS: 0.8 10*3/uL (ref 0.1–1.0)
Monocytes Relative: 7 % (ref 3–12)
Neutro Abs: 8.4 10*3/uL — ABNORMAL HIGH (ref 1.7–7.7)
Neutrophils Relative %: 69 % (ref 43–77)
PLATELETS: 295 10*3/uL (ref 150–400)
RBC: 3.9 MIL/uL (ref 3.87–5.11)
RDW: 14.2 % (ref 11.5–15.5)
WBC: 12.2 10*3/uL — ABNORMAL HIGH (ref 4.0–10.5)

## 2014-11-18 LAB — HCG, QUANTITATIVE, PREGNANCY: hCG, Beta Chain, Quant, S: 124578 m[IU]/mL — ABNORMAL HIGH (ref <5)

## 2014-11-18 LAB — POCT PREGNANCY, URINE: Preg Test, Ur: POSITIVE — AB

## 2014-11-18 MED ORDER — SODIUM CHLORIDE 0.9 % IJ SOLN
3.0000 mL | Freq: Two times a day (BID) | INTRAMUSCULAR | Status: DC
  Administered 2014-11-19 – 2014-11-20 (×2): 3 mL via INTRAVENOUS

## 2014-11-18 MED ORDER — SODIUM CHLORIDE 0.9 % IJ SOLN: 3.0000 mL | INTRAMUSCULAR | Status: DC | PRN

## 2014-11-18 NOTE — MAU Note (Signed)
Sharp abd pain on right side lower, and in low back for 2 days.  No bleeding. Positive UPT at Dr Tenny Craw few weeks ago.  Next appt in Sept 21st?.  No U/S yet at office.

## 2014-11-18 NOTE — MAU Provider Note (Signed)
History     CSN: [id number]  Arrival date and time: 11/18/14 2015   First Provider Initiated Contact with Patient 11/18/14 2112      Chief Complaint  Patient presents with  . Abdominal Pain   HPI Erin Jacobson 24 y.o. G2P1001 @[redacted]w[redacted]d  presents complaining of abdominal pain.  It started 2 days ago.  It is noted in Right lower abdomen, vagina and back.  A few days ago she saw dentist because of dental problems.  She was given Tylenol #3 and amoxicillin which she has been using since 2-3 days ago.   She also has nausea, vomiting but has not vomited today.   She denies vaginal discharge/bleeding/fever/weakness/dysuria.   OB History    Gravida Para Term Preterm AB TAB SAB Ectopic Multiple Living   2 1 1       1       Past Medical History  Diagnosis Date  . Complication of anesthesia   . Family history of adverse reaction to anesthesia     mother has n/v post op  . GERD (gastroesophageal reflux disease)     no current meds for reflux  . Kidney stone     Past Surgical History  Procedure Laterality Date  . Appendectomy  04/01/2004    ex. lap.  . Knee surgery    . Cystoscopy with retrograde pyelogram, ureteroscopy and stent placement Left 05/05/2014    Procedure: LEFT  URETEROSCOPY,  RETROGRADE PYELOGRAM,  AND STENT PLACEMENT, STONE REMOVAL;  Surgeon: Crist Fat, MD;  Location: [facility];  Service: Urology;  Laterality: Left;  . Holmium laser application Left 05/05/2014    Procedure: HOLMIUM LASER APPLICATION;  Surgeon: Crist Fat, MD;  Location: [facility];  Service: Urology;  Laterality: Left;    Family History  Problem Relation Age of Onset  . Anesthesia problems Mother     post-op N/V, hard to wake up    Social History  Substance Use Topics  . Smoking status: Former Smoker    Types: Cigars    Quit date: 09/24/2014  . Smokeless tobacco: Never Used  . Alcohol Use: Yes     Comment: occasional/ not since pregnancy    Allergies: No Known Allergies  Prescriptions  prior to admission  Medication Sig Dispense Refill Last Dose  . acetaminophen-codeine (TYLENOL #3) 300-30 MG per tablet Take 1 tablet by mouth every 4 (four) hours as needed for moderate pain.    11/17/2014 at 1100  . amoxicillin (AMOXIL) 500 MG capsule Take 500 mg by mouth 3 (three) times daily.   11/18/2014 at Unknown time  . folic acid (FOLVITE) 400 MCG tablet Take 400 mcg by mouth at bedtime.   11/17/2014 at Unknown time  . Prenatal Vit-Fe Fumarate-FA (PRENATAL MULTIVITAMIN) TABS tablet Take 1 tablet by mouth daily.   11/18/2014 at Unknown time  . ciprofloxacin-dexamethasone (CIPRODEX) otic suspension Place 4 drops into the right ear 2 (two) times daily. (Patient not taking: Reported on 11/18/2014) 7.5 mL 0     ROS Pertinent ROS in HPI.  All other systems are negative.   Physical Exam   Blood pressure 125/67, pulse 92, temperature 98.9 F (37.2 C), temperature source Oral, resp. rate 16, height 5' (1.524 m), weight 140 lb 4 oz (63.617 kg), last menstrual period 09/17/2014, SpO2 98 %.  Physical Exam  Constitutional: She is oriented to person, place, and time. She appears well-developed and well-nourished. No distress.  HENT:  Head: Normocephalic and atraumatic.  Eyes: EOM are normal.  Neck: Normal range of motion.  Cardiovascular: Normal rate and normal heart sounds.   Respiratory: Effort normal and breath sounds normal. No respiratory distress.  GI: Soft. She exhibits no distension. There is no tenderness. There is no rebound and no guarding.  Genitourinary:  White clumpy discharge present.  No bleeding.  No CMT.  No adnexal mass or tenderness.  Musculoskeletal: Normal range of motion.  Neurological: She is alert and oriented to person, place, and time.  Skin: Skin is warm and dry.  Psychiatric: She has a normal mood and affect.   Results for orders placed or performed during the hospital encounter of 11/18/14 (from the past 24 hour(s))  Urinalysis, Routine w reflex microscopic (not  at [facility])     Status: Abnormal   Collection Time: 11/18/14  8:40 PM  Result Value Ref Range   Color, Urine YELLOW YELLOW   APPearance CLEAR CLEAR   Specific Gravity, Urine >1.030 (H) 1.005 - 1.030   pH 5.5 5.0 - 8.0   Glucose, UA NEGATIVE NEGATIVE mg/dL   Hgb urine dipstick NEGATIVE NEGATIVE   Bilirubin Urine NEGATIVE NEGATIVE   Ketones, ur NEGATIVE NEGATIVE mg/dL   Protein, ur NEGATIVE NEGATIVE mg/dL   Urobilinogen, UA 0.2 0.0 - 1.0 mg/dL   Nitrite NEGATIVE NEGATIVE   Leukocytes, UA NEGATIVE NEGATIVE  Pregnancy, urine POC     Status: Abnormal   Collection Time: 11/18/14  8:46 PM  Result Value Ref Range   Preg Test, Ur POSITIVE (A) NEGATIVE  CBC with Differential/Platelet     Status: Abnormal   Collection Time: 11/18/14  8:55 PM  Result Value Ref Range   WBC 12.2 (H) 4.0 - 10.5 K/uL   RBC 3.90 3.87 - 5.11 MIL/uL   Hemoglobin 11.3 (L) 12.0 - 15.0 g/dL   HCT 16.1 (L) 09.6 - 04.5 %   MCV 85.4 78.0 - 100.0 fL   MCH 29.0 26.0 - 34.0 pg   MCHC 33.9 30.0 - 36.0 g/dL   RDW 40.9 81.1 - 91.4 %   Platelets 295 150 - 400 K/uL   Neutrophils Relative % 69 43 - 77 %   Neutro Abs 8.4 (H) 1.7 - 7.7 K/uL   Lymphocytes Relative 21 12 - 46 %   Lymphs Abs 2.6 0.7 - 4.0 K/uL   Monocytes Relative 7 3 - 12 %   Monocytes Absolute 0.8 0.1 - 1.0 K/uL   Eosinophils Relative 3 0 - 5 %   Eosinophils Absolute 0.3 0.0 - 0.7 K/uL   Basophils Relative 0 0 - 1 %   Basophils Absolute 0.0 0.0 - 0.1 K/uL  hCG, quantitative, pregnancy     Status: Abnormal   Collection Time: 11/18/14  8:55 PM  Result Value Ref Range   hCG, Beta Chain, Quant, S [id number] (H) <5 mIU/mL  Wet prep, genital     Status: Abnormal   Collection Time: 11/18/14  9:20 PM  Result Value Ref Range   Yeast Wet Prep HPF POC MODERATE (A) NONE SEEN   Trich, Wet Prep NONE SEEN NONE SEEN   Clue Cells Wet Prep HPF POC NONE SEEN NONE SEEN   WBC, Wet Prep HPF POC FEW (A) NONE SEEN    US Ob Comp Less 14 Wks  11/18/2014   CLINICAL DATA:   Right-sided pelvic pain. Estimated gestational age by LMP is 8 weeks 6 days.  EXAM: OBSTETRIC <14 WK [place] AND TRANSVAGINAL OB US  TECHNIQUE: Both transabdominal and transvaginal ultrasound examinations were performed for complete evaluation  of the gestation as well as the maternal uterus, adnexal regions, and pelvic cul-de-sac. Transvaginal technique was performed to assess early pregnancy.  COMPARISON:  None.  FINDINGS: Intrauterine gestational sac: A single intrauterine pregnancy is demonstrated.  Yolk sac:  Yolk sac is present.  Embryo:  Fetal pole is present.  Cardiac Activity: Fetal cardiac activity is observed.  Heart Rate: 173  bpm  CRL:  22.2  mm   8 w   6 d                  [place] EDC: 06/24/2015  Maternal uterus/adnexae: The uterus is anteverted. No myometrial mass lesions are identified. No subchorionic hemorrhage.  There is a cystic structure in the right ovary measuring about 1 cm diameter with moderate surrounding flow on color flow Doppler imaging. Within the cysts is a separate cyst and a linear structure. This is suspicious for heterotopic ectopic pregnancy with yolk sac and fetal pole. If this does represent ectopic pregnancy, the fetal pole measurement is consistent with 5 weeks 6 days gestational age. No fetal heart tones are demonstrated. Alternatively, this could represent a complex ovarian cyst with daughter cyst. Follow-up is suggested as clinically indicated. No free fluid in the pelvis.  IMPRESSION: Single intrauterine pregnancy is demonstrated. Estimated gestational age by crown-rump length is 8 weeks 6 days. No acute complication is suggested.  Cystic structure in the right ovary with possible yolk sac and fetal pole, suspicious for heterotopic ectopic pregnancy. Follow-up suggested as clinically indicated.  These results were called by telephone at the time of interpretation on 11/18/2014 at 11:17 pm to PA [facility] , who verbally acknowledged these results.   Electronically Signed    By: Burman Nieves M.D.   On: 11/18/2014 23:28   US Ob Transvaginal  11/18/2014   CLINICAL DATA:  Right-sided pelvic pain. Estimated gestational age by LMP is 8 weeks 6 days.  EXAM: OBSTETRIC <14 WK [place] AND TRANSVAGINAL OB US  TECHNIQUE: Both transabdominal and transvaginal ultrasound examinations were performed for complete evaluation of the gestation as well as the maternal uterus, adnexal regions, and pelvic cul-de-sac. Transvaginal technique was performed to assess early pregnancy.  COMPARISON:  None.  FINDINGS: Intrauterine gestational sac: A single intrauterine pregnancy is demonstrated.  Yolk sac:  Yolk sac is present.  Embryo:  Fetal pole is present.  Cardiac Activity: Fetal cardiac activity is observed.  Heart Rate: 173  bpm  CRL:  22.2  mm   8 w   6 d                  [place] EDC: 06/24/2015  Maternal uterus/adnexae: The uterus is anteverted. No myometrial mass lesions are identified. No subchorionic hemorrhage.  There is a cystic structure in the right ovary measuring about 1 cm diameter with moderate surrounding flow on color flow Doppler imaging. Within the cysts is a separate cyst and a linear structure. This is suspicious for heterotopic ectopic pregnancy with yolk sac and fetal pole. If this does represent ectopic pregnancy, the fetal pole measurement is consistent with 5 weeks 6 days gestational age. No fetal heart tones are demonstrated. Alternatively, this could represent a complex ovarian cyst with daughter cyst. Follow-up is suggested as clinically indicated. No free fluid in the pelvis.  IMPRESSION: Single intrauterine pregnancy is demonstrated. Estimated gestational age by crown-rump length is 8 weeks 6 days. No acute complication is suggested.  Cystic structure in the right ovary with possible yolk sac and fetal  pole, suspicious for heterotopic ectopic pregnancy. Follow-up suggested as clinically indicated.  These results were called by telephone at the time of interpretation on 11/18/2014 at  11:17 pm to PA [facility] , who verbally acknowledged these results.   Electronically Signed   By: Burman Nieves M.D.   On: 11/18/2014 23:28   MAU Course  Procedures  MDM Discussed with Dr. Mora Appl.  Results of ultrasound discussed: possible heterotopic ectopic.  Pt to be admitted for observation.  Clarification: no IVF or assisted fertility therapies used this pregnancy.   Pt presently reporting 2/10 pain.    Assessment and Plan  A: Heterotopic Ectopic  P: ADMIT per Dr. Coolidge Breeze 11/18/2014, 9:13 PM

## 2014-11-18 NOTE — H&P (Signed)
GYNECOLOGY H&P    History    Chief Complaint  Patient presents with  . Abdominal Pain   HPI Erin Jacobson 24 y.o. G2P1001 @[redacted]w[redacted]d  presents complaining of abdominal pain. It started 2 days ago. It is noted in Right lower abdomen, vagina and back. A few days ago she saw dentist because of dental problems. She was given Tylenol #3 and amoxicillin which she has been using since 2-3 days ago. She also has nausea, vomiting but has not vomited today.  She denies vaginal discharge/bleeding/fever/weakness/dysuria.   OB History    Gravida Para Term Preterm AB TAB SAB Ectopic Multiple Living   2 1 1       1       Past Medical History  Diagnosis Date  . Complication of anesthesia   . Family history of adverse reaction to anesthesia     mother has n/v post op  . GERD (gastroesophageal reflux disease)     no current meds for reflux  . Kidney stone     Past Surgical History  Procedure Laterality Date  . Appendectomy  04/01/2004    ex. lap.  . Knee surgery    . Cystoscopy with retrograde pyelogram, ureteroscopy and stent placement Left 05/05/2014    Procedure: LEFT URETEROSCOPY, RETROGRADE PYELOGRAM, AND STENT PLACEMENT, STONE REMOVAL; Surgeon: Crist Fat, MD; Location: [facility]; Service: Urology; Laterality: Left;  . Holmium laser application Left 05/05/2014    Procedure: HOLMIUM LASER APPLICATION; Surgeon: Crist Fat, MD; Location: [facility]; Service: Urology; Laterality: Left;    Family History  Problem Relation Age of Onset  . Anesthesia problems Mother     post-op N/V, hard to wake up    Social History  Substance Use Topics  . Smoking status: Former Smoker    Types: Cigars    Quit date: 09/24/2014  . Smokeless tobacco: Never Used  . Alcohol Use: Yes     Comment: occasional/ not since pregnancy    Allergies: No Known  Allergies  Prescriptions prior to admission  Medication Sig Dispense Refill Last Dose  . acetaminophen-codeine (TYLENOL #3) 300-30 MG per tablet Take 1 tablet by mouth every 4 (four) hours as needed for moderate pain.    11/17/2014 at 1100  . amoxicillin (AMOXIL) 500 MG capsule Take 500 mg by mouth 3 (three) times daily.   11/18/2014 at Unknown time  . folic acid (FOLVITE) 400 MCG tablet Take 400 mcg by mouth at bedtime.   11/17/2014 at Unknown time  . Prenatal Vit-Fe Fumarate-FA (PRENATAL MULTIVITAMIN) TABS tablet Take 1 tablet by mouth daily.   11/18/2014 at Unknown time  . ciprofloxacin-dexamethasone (CIPRODEX) otic suspension Place 4 drops into the right ear 2 (two) times daily. (Patient not taking: Reported on 11/18/2014) 7.5 mL 0     ROS Pertinent ROS in HPI. All other systems are negative.   Physical Exam   Blood pressure 125/67, pulse 92, temperature 98.9 F (37.2 C), temperature source Oral, resp. rate 16, height 5' (1.524 m), weight 140 lb 4 oz (63.617 kg), last menstrual period 09/17/2014, SpO2 98 %.  Physical Exam  Constitutional: She is oriented to person, place, and time. She appears well-developed and well-nourished. No distress.  HENT:  Head: Normocephalic and atraumatic.  Eyes: EOM are normal.  Neck: Normal range of motion.  Cardiovascular: Normal rate and normal heart sounds.  Respiratory: Effort normal and breath sounds normal. No respiratory distress.  GI: Soft. She exhibits no distension. There is no tenderness. There is no rebound  and no guarding.  Genitourinary:  White clumpy discharge present. No bleeding.  No CMT. No adnexal mass or tenderness.  Musculoskeletal: Normal range of motion.  Neurological: She is alert and oriented to person, place, and time.  Skin: Skin is warm and dry.  Psychiatric: She has a normal mood and affect.   Results for orders placed or performed during the hospital encounter of 11/18/14  (from the past 24 hour(s))  Urinalysis, Routine w reflex microscopic (not at [facility]) Status: Abnormal   Collection Time: 11/18/14 8:40 PM  Result Value Ref Range   Color, Urine YELLOW YELLOW   APPearance CLEAR CLEAR   Specific Gravity, Urine >1.030 (H) 1.005 - 1.030   pH 5.5 5.0 - 8.0   Glucose, UA NEGATIVE NEGATIVE mg/dL   Hgb urine dipstick NEGATIVE NEGATIVE   Bilirubin Urine NEGATIVE NEGATIVE   Ketones, ur NEGATIVE NEGATIVE mg/dL   Protein, ur NEGATIVE NEGATIVE mg/dL   Urobilinogen, UA 0.2 0.0 - 1.0 mg/dL   Nitrite NEGATIVE NEGATIVE   Leukocytes, UA NEGATIVE NEGATIVE  Pregnancy, urine POC Status: Abnormal   Collection Time: 11/18/14 8:46 PM  Result Value Ref Range   Preg Test, Ur POSITIVE (A) NEGATIVE  CBC with Differential/Platelet Status: Abnormal   Collection Time: 11/18/14 8:55 PM  Result Value Ref Range   WBC 12.2 (H) 4.0 - 10.5 K/uL   RBC 3.90 3.87 - 5.11 MIL/uL   Hemoglobin 11.3 (L) 12.0 - 15.0 g/dL   HCT 16.1 (L) 09.6 - 04.5 %   MCV 85.4 78.0 - 100.0 fL   MCH 29.0 26.0 - 34.0 pg   MCHC 33.9 30.0 - 36.0 g/dL   RDW 40.9 81.1 - 91.4 %   Platelets 295 150 - 400 K/uL   Neutrophils Relative % 69 43 - 77 %   Neutro Abs 8.4 (H) 1.7 - 7.7 K/uL   Lymphocytes Relative 21 12 - 46 %   Lymphs Abs 2.6 0.7 - 4.0 K/uL   Monocytes Relative 7 3 - 12 %   Monocytes Absolute 0.8 0.1 - 1.0 K/uL   Eosinophils Relative 3 0 - 5 %   Eosinophils Absolute 0.3 0.0 - 0.7 K/uL   Basophils Relative 0 0 - 1 %   Basophils Absolute 0.0 0.0 - 0.1 K/uL  hCG, quantitative, pregnancy Status: Abnormal   Collection Time: 11/18/14 8:55 PM  Result Value Ref Range   hCG, Beta Chain, Quant, S [id number] (H) <5 mIU/mL  Wet prep, genital Status: Abnormal   Collection Time: 11/18/14 9:20 PM  Result Value Ref Range   Yeast Wet  Prep HPF POC MODERATE (A) NONE SEEN   Trich, Wet Prep NONE SEEN NONE SEEN   Clue Cells Wet Prep HPF POC NONE SEEN NONE SEEN   WBC, Wet Prep HPF POC FEW (A) NONE SEEN    US Ob Comp Less 14 Wks  11/18/2014 CLINICAL DATA: Right-sided pelvic pain. Estimated gestational age by LMP is 8 weeks 6 days. EXAM: OBSTETRIC <14 WK [place] AND TRANSVAGINAL OB US TECHNIQUE: Both transabdominal and transvaginal ultrasound examinations were performed for complete evaluation of the gestation as well as the maternal uterus, adnexal regions, and pelvic cul-de-sac. Transvaginal technique was performed to assess early pregnancy. COMPARISON: None. FINDINGS: Intrauterine gestational sac: A single intrauterine pregnancy is demonstrated. Yolk sac: Yolk sac is present. Embryo: Fetal pole is present. Cardiac Activity: Fetal cardiac activity is observed. Heart Rate: 173 bpm CRL: 22.2 mm 8 w 6 d [place] EDC: 06/24/2015 Maternal uterus/adnexae: The uterus is anteverted.  No myometrial mass lesions are identified. No subchorionic hemorrhage. There is a cystic structure in the right ovary measuring about 1 cm diameter with moderate surrounding flow on color flow Doppler imaging. Within the cysts is a separate cyst and a linear structure. This is suspicious for heterotopic ectopic pregnancy with yolk sac and fetal pole. If this does represent ectopic pregnancy, the fetal pole measurement is consistent with 5 weeks 6 days gestational age. No fetal heart tones are demonstrated. Alternatively, this could represent a complex ovarian cyst with daughter cyst. Follow-up is suggested as clinically indicated. No free fluid in the pelvis. IMPRESSION: Single intrauterine pregnancy is demonstrated. Estimated gestational age by crown-rump length is 8 weeks 6 days. No acute complication is suggested. Cystic structure in the right ovary with possible yolk sac and fetal pole, suspicious for heterotopic ectopic  pregnancy. Follow-up suggested as clinically indicated. These results were called by telephone at the time of interpretation on 11/18/2014 at 11:17 pm to PA [facility] , who verbally acknowledged these results. Electronically Signed By: Burman Nieves M.D. On: 11/18/2014 23:28   US Ob Transvaginal  11/18/2014 CLINICAL DATA: Right-sided pelvic pain. Estimated gestational age by LMP is 8 weeks 6 days. EXAM: OBSTETRIC <14 WK [place] AND TRANSVAGINAL OB US TECHNIQUE: Both transabdominal and transvaginal ultrasound examinations were performed for complete evaluation of the gestation as well as the maternal uterus, adnexal regions, and pelvic cul-de-sac. Transvaginal technique was performed to assess early pregnancy. COMPARISON: None. FINDINGS: Intrauterine gestational sac: A single intrauterine pregnancy is demonstrated. Yolk sac: Yolk sac is present. Embryo: Fetal pole is present. Cardiac Activity: Fetal cardiac activity is observed. Heart Rate: 173 bpm CRL: 22.2 mm 8 w 6 d [place] EDC: 06/24/2015 Maternal uterus/adnexae: The uterus is anteverted. No myometrial mass lesions are identified. No subchorionic hemorrhage. There is a cystic structure in the right ovary measuring about 1 cm diameter with moderate surrounding flow on color flow Doppler imaging. Within the cysts is a separate cyst and a linear structure. This is suspicious for heterotopic ectopic pregnancy with yolk sac and fetal pole. If this does represent ectopic pregnancy, the fetal pole measurement is consistent with 5 weeks 6 days gestational age. No fetal heart tones are demonstrated. Alternatively, this could represent a complex ovarian cyst with daughter cyst. Follow-up is suggested as clinically indicated. No free fluid in the pelvis. IMPRESSION: Single intrauterine pregnancy is demonstrated. Estimated gestational age by crown-rump length is 8 weeks 6 days. No acute complication is suggested. Cystic  structure in the right ovary with possible yolk sac and fetal pole, suspicious for heterotopic ectopic pregnancy. Follow-up suggested as clinically indicated. These results were called by telephone at the time of interpretation on 11/18/2014 at 11:17 pm to PA [facility] , who verbally acknowledged these results. Electronically Signed By: Burman Nieves M.D. On: 11/18/2014 23:28      ASSESSMENT:  24 yo G2P1 at 8 weeks 6 days with possible Spontaneous Heterotopic Pregnancy vs. Right ovarian cyst. Viable  Intrauterine pregnancy.Normal right ovarian blood flow. Patient with abdominal pain.  No free fluid on ultrasound so at this time there is not a concern for rupture.   PLAN: Admit patient to med surg floor for close observation, serial abdominal exams Will perform Type and Screen Will trend H/H.  If patient clinical picture worsens (ie patient becomes hemodynamically unstable, marked drop in hemoglobin), or if there is evidence of a surgical abdomen,  will consider laparoscopic surgery.  This is a patient of Dr. Enrique Sack  Tenny Craw, will consult with her Monday to see if she has any further recommendations for management.   Sho Salguero STACIA

## 2014-11-19 ENCOUNTER — Inpatient Hospital Stay (HOSPITAL_COMMUNITY): Payer: BLUE CROSS/BLUE SHIELD

## 2014-11-19 ENCOUNTER — Encounter (HOSPITAL_COMMUNITY): Payer: Self-pay | Admitting: *Deleted

## 2014-11-19 DIAGNOSIS — Z87891 Personal history of nicotine dependence: Secondary | ICD-10-CM | POA: Diagnosis not present

## 2014-11-19 DIAGNOSIS — O0091 Unspecified ectopic pregnancy with intrauterine pregnancy: Secondary | ICD-10-CM | POA: Diagnosis present

## 2014-11-19 DIAGNOSIS — O008 Other ectopic pregnancy: Secondary | ICD-10-CM | POA: Diagnosis present

## 2014-11-19 DIAGNOSIS — R109 Unspecified abdominal pain: Secondary | ICD-10-CM | POA: Diagnosis present

## 2014-11-19 DIAGNOSIS — Z87442 Personal history of urinary calculi: Secondary | ICD-10-CM | POA: Diagnosis not present

## 2014-11-19 LAB — TYPE AND SCREEN
ABO/RH(D): B POS
ANTIBODY SCREEN: NEGATIVE

## 2014-11-19 LAB — HEMOGLOBIN AND HEMATOCRIT, BLOOD
HCT: 32.7 % — ABNORMAL LOW (ref 36.0–46.0)
HEMATOCRIT: 32.9 % — AB (ref 36.0–46.0)
HEMOGLOBIN: 11.3 g/dL — AB (ref 12.0–15.0)
Hemoglobin: 11.3 g/dL — ABNORMAL LOW (ref 12.0–15.0)

## 2014-11-19 LAB — RPR: RPR Ser Ql: NONREACTIVE

## 2014-11-19 LAB — HIV ANTIBODY (ROUTINE TESTING W REFLEX): HIV SCREEN 4TH GENERATION: NONREACTIVE

## 2014-11-19 MED ORDER — HYDROMORPHONE HCL 1 MG/ML IJ SOLN
1.0000 mg | INTRAMUSCULAR | Status: DC | PRN
  Administered 2014-11-19: 1 mg via INTRAVENOUS
  Filled 2014-11-19: qty 1

## 2014-11-19 MED ORDER — ACETAMINOPHEN 325 MG PO TABS
650.0000 mg | ORAL_TABLET | Freq: Four times a day (QID) | ORAL | Status: DC | PRN
  Administered 2014-11-19: 650 mg via ORAL
  Filled 2014-11-19: qty 2

## 2014-11-19 MED ORDER — PROMETHAZINE HCL 25 MG/ML IJ SOLN
25.0000 mg | Freq: Four times a day (QID) | INTRAMUSCULAR | Status: DC | PRN
  Administered 2014-11-19: 25 mg via INTRAVENOUS
  Filled 2014-11-19: qty 1

## 2014-11-19 NOTE — Plan of Care (Signed)
Problem: Phase I Progression Outcomes Goal: Maintains reassuring Fetal Heart Rate Outcome: Not Applicable Date Met:  10/91/45 Only [redacted] weeks pregnant.

## 2014-11-19 NOTE — Plan of Care (Signed)
Problem: Phase I Progression Outcomes Goal: Initial discharge plan identified Outcome: Completed/Met Date Met:  11/19/14 Pain control Nausea controlled  Tolerating some Regular diet Maintain weight Understands home and f/u care Goal: Other Phase I Outcomes/Goals Outcome: Completed/Met Date Met:  11/19/14 Nausea relief with Phenergan  Problem: Phase II Progression Outcomes Goal: Electronic fetal monitoring(Doppler,Continuous,Intermittent) EFM (Doppler, Continuous, Intermittent)  Outcome: Not Applicable Date Met:  25/05/39 IUP to early to doppler Goal: Mattress in use Mattress in use ( [employer], Med/Surg, Regular, Birthing Suite, Other)  Outcome: Completed/Met Date Met:  11/19/14 Regular mattress. Goal: Progress activity as tolerated unless otherwise ordered Outcome: Completed/Met Date Met:  11/19/14 Tolerates walking to bathroom

## 2014-11-19 NOTE — Progress Notes (Signed)
Subjective: Patient reports worsening pain on right side radiating to back. 7/10 She denies chest pain, shortness of breath, light headed ness, nausea or vomiting.   Objective: I have reviewed patient's vital signs, intake and output and labs.  General: alert, cooperative and no distress GI: soft, non-tender; bowel sounds normal; no masses,  no organomegaly and normal findings: no rebound tenderness or peritoneal signs Vaginal Bleeding: none   Assessment/Plan: 24 yo with possible spontaneous heterotopic pregnancy.  Counseled patient that we will continue to be conservative as her abdomen is benign. However if her pain worsens And she has other associated sx like nausea to notify the MD or RN.   We discussed possible management with Dx / operative laparoscopy depending on her clinical picture. Will repeat US later in the week.  Will get f/u H/H today Dilaudid prn pain  Will allow patient to eat today, NPO after midnight.      LOS: 0 days    Erin Jacobson STACIA 11/19/2014, 10:17 AM

## 2014-11-20 LAB — GC/CHLAMYDIA PROBE AMP (~~LOC~~) NOT AT ARMC
Chlamydia: NEGATIVE
NEISSERIA GONORRHEA: NEGATIVE

## 2014-11-20 LAB — HEMOGLOBIN AND HEMATOCRIT, BLOOD
HCT: 31.9 % — ABNORMAL LOW (ref 36.0–46.0)
Hemoglobin: 10.8 g/dL — ABNORMAL LOW (ref 12.0–15.0)

## 2014-11-20 NOTE — Discharge Summary (Signed)
HD#2 suspected heterotopic pregnancy Pt comfortable without complaints.  Pain has completely resolved.  No bleeding or other issues. Discussed [place] results- original area of concern for heterotopic pregnancy now absent on repeat scan, discussed risk of ectopic pregnancy with patient and precautions. Will have pt f/u in office within 1 week for repeat US. Pt to call or return to ER if symptoms return.

## 2014-11-20 NOTE — Progress Notes (Signed)
Pt ambulated out and will drive self home  MD aware  Teaching complete

## 2014-11-30 ENCOUNTER — Emergency Department (HOSPITAL_COMMUNITY): Payer: BLUE CROSS/BLUE SHIELD

## 2014-11-30 ENCOUNTER — Observation Stay (HOSPITAL_COMMUNITY)
Admission: AD | Admit: 2014-11-30 | Discharge: 2014-12-01 | Disposition: A | Discharge status: Home / Self Care | Payer: BLUE CROSS/BLUE SHIELD | Source: Ambulatory Visit | Attending: Obstetrics | Admitting: Obstetrics

## 2014-11-30 ENCOUNTER — Emergency Department (HOSPITAL_BASED_OUTPATIENT_CLINIC_OR_DEPARTMENT_OTHER): Payer: BLUE CROSS/BLUE SHIELD

## 2014-11-30 ENCOUNTER — Inpatient Hospital Stay (HOSPITAL_COMMUNITY)
Admission: EM | Admit: 2014-11-30 | Discharge: 2014-11-30 | DRG: 782 | Disposition: A | Discharge status: Other Acute Inpatient Hospital | Payer: BLUE CROSS/BLUE SHIELD | Attending: Obstetrics | Admitting: Obstetrics

## 2014-11-30 ENCOUNTER — Encounter (HOSPITAL_COMMUNITY): Payer: Self-pay | Admitting: *Deleted

## 2014-11-30 ENCOUNTER — Encounter (HOSPITAL_COMMUNITY): Payer: Self-pay | Admitting: Emergency Medicine

## 2014-11-30 DIAGNOSIS — K219 Gastro-esophageal reflux disease without esophagitis: Secondary | ICD-10-CM | POA: Diagnosis not present

## 2014-11-30 DIAGNOSIS — Z09 Encounter for follow-up examination after completed treatment for conditions other than malignant neoplasm: Secondary | ICD-10-CM

## 2014-11-30 DIAGNOSIS — Z3A10 10 weeks gestation of pregnancy: Secondary | ICD-10-CM | POA: Diagnosis present

## 2014-11-30 DIAGNOSIS — O2231 Deep phlebothrombosis in pregnancy, first trimester: Secondary | ICD-10-CM | POA: Diagnosis present

## 2014-11-30 DIAGNOSIS — Z87891 Personal history of nicotine dependence: Secondary | ICD-10-CM

## 2014-11-30 DIAGNOSIS — M79602 Pain in left arm: Secondary | ICD-10-CM

## 2014-11-30 DIAGNOSIS — M79609 Pain in unspecified limb: Secondary | ICD-10-CM | POA: Diagnosis not present

## 2014-11-30 DIAGNOSIS — Z3A11 11 weeks gestation of pregnancy: Secondary | ICD-10-CM | POA: Insufficient documentation

## 2014-11-30 DIAGNOSIS — O2230 Deep phlebothrombosis in pregnancy, unspecified trimester: Secondary | ICD-10-CM | POA: Diagnosis present

## 2014-11-30 DIAGNOSIS — O99611 Diseases of the digestive system complicating pregnancy, first trimester: Secondary | ICD-10-CM | POA: Diagnosis not present

## 2014-11-30 DIAGNOSIS — N8320 Unspecified ovarian cysts: Secondary | ICD-10-CM | POA: Diagnosis present

## 2014-11-30 DIAGNOSIS — O2391 Unspecified genitourinary tract infection in pregnancy, first trimester: Secondary | ICD-10-CM | POA: Diagnosis not present

## 2014-11-30 DIAGNOSIS — I82622 Acute embolism and thrombosis of deep veins of left upper extremity: Secondary | ICD-10-CM | POA: Diagnosis not present

## 2014-11-30 DIAGNOSIS — Z3491 Encounter for supervision of normal pregnancy, unspecified, first trimester: Secondary | ICD-10-CM

## 2014-11-30 DIAGNOSIS — O3481 Maternal care for other abnormalities of pelvic organs, first trimester: Secondary | ICD-10-CM | POA: Diagnosis present

## 2014-11-30 DIAGNOSIS — N200 Calculus of kidney: Secondary | ICD-10-CM | POA: Diagnosis not present

## 2014-11-30 LAB — BASIC METABOLIC PANEL
ANION GAP: 13 (ref 5–15)
BUN: 7 mg/dL (ref 6–20)
CO2: 17 mmol/L — ABNORMAL LOW (ref 22–32)
Calcium: 9.2 mg/dL (ref 8.9–10.3)
Chloride: 109 mmol/L (ref 101–111)
Creatinine, Ser: 0.61 mg/dL (ref 0.44–1.00)
Glucose, Bld: 71 mg/dL (ref 65–99)
POTASSIUM: 3.8 mmol/L (ref 3.5–5.1)
SODIUM: 139 mmol/L (ref 135–145)

## 2014-11-30 LAB — CBC
HEMATOCRIT: 34.6 % — AB (ref 36.0–46.0)
HEMOGLOBIN: 12 g/dL (ref 12.0–15.0)
MCH: 29.3 pg (ref 26.0–34.0)
MCHC: 34.7 g/dL (ref 30.0–36.0)
MCV: 84.6 fL (ref 78.0–100.0)
Platelets: 303 10*3/uL (ref 150–400)
RBC: 4.09 MIL/uL (ref 3.87–5.11)
RDW: 13.9 % (ref 11.5–15.5)
WBC: 11.9 10*3/uL — AB (ref 4.0–10.5)

## 2014-11-30 LAB — TYPE AND SCREEN
ABO/RH(D): B POS
Antibody Screen: NEGATIVE

## 2014-11-30 MED ORDER — SODIUM CHLORIDE 0.9 % IV BOLUS (SEPSIS)
1000.0000 mL | Freq: Once | INTRAVENOUS | Status: AC
  Administered 2014-11-30: 1000 mL via INTRAVENOUS

## 2014-11-30 MED ORDER — ACETAMINOPHEN 325 MG PO TABS: 650.0000 mg | ORAL_TABLET | ORAL | Status: DC | PRN

## 2014-11-30 MED ORDER — CALCIUM CARBONATE ANTACID 500 MG PO CHEW: 2.0000 | CHEWABLE_TABLET | ORAL | Status: DC | PRN

## 2014-11-30 MED ORDER — ENOXAPARIN (LOVENOX) PATIENT EDUCATION KIT
PACK | Freq: Once | Status: AC
  Administered 2014-11-30: 18:00:00
  Filled 2014-11-30: qty 1

## 2014-11-30 MED ORDER — XARELTO VTE STARTER PACK 15 & 20 MG PO TBPK: 15.0000 mg | ORAL_TABLET | ORAL | Status: DC

## 2014-11-30 MED ORDER — MORPHINE SULFATE (PF) 4 MG/ML IV SOLN
4.0000 mg | Freq: Once | INTRAVENOUS | Status: AC
  Administered 2014-11-30: 4 mg via INTRAVENOUS
  Filled 2014-11-30: qty 1

## 2014-11-30 MED ORDER — PRENATAL MULTIVITAMIN CH: 1.0000 | ORAL_TABLET | Freq: Every day | ORAL | Status: DC

## 2014-11-30 MED ORDER — ENOXAPARIN SODIUM 60 MG/0.6ML ~~LOC~~ SOLN
1.0000 mg/kg | Freq: Two times a day (BID) | SUBCUTANEOUS | Status: DC
  Administered 2014-11-30 – 2014-12-01 (×2): 60 mg via SUBCUTANEOUS
  Filled 2014-11-30 (×2): qty 0.6

## 2014-11-30 NOTE — H&P (Signed)
24 y.o. G2P1001 @ [redacted]w[redacted]d presents to the ED w complaints of left upper extremity pain x 3-4 days.  Pain has worsened over the last two days and is spreading to her upper arm and shoulder.  Physical exam was unremarkable, but UE dopplers were obtained and showed a brachial dvt.  She was transferred to   Pregnancy c/b: 1. Right adnexal mass: previous concern for heterotopic pregnancy of right ovary.  Was admitted 8/27-8/28.  F/u US on 8/28 showed NO heterotopic and only a complex right adnexal mass.  F/U US prior to transfer today showed SIUP and a stable, complex adnexal mass  [place] report 9/8:    Maternal uterus/adnexae: Right ovary measures 3.7 by 2.0 by 2.4 cm and contains a mixed cystic and solid appearing structure measuring 1.5 by 1.8 by 1.9 cm. The appearance of this structure has not significantly changed from the prior ultrasound dated 11/19/2014. The questioned presence of fetal pole within this structure from the ultrasound of 11/18/2014 is no longer visualized. Left ovary measures 2.7 x 1.3 by 2.3 cm.    IMPRESSION: Single live intrauterine pregnancy corresponding to 10 weeks and 5 days gestation.  Stable right adnexal mixed cystic and solid structure. This may represent a corpus luteum. Continued follow-up is recommended.  Past Medical History  Diagnosis Date  . Complication of anesthesia   . Family history of adverse reaction to anesthesia     mother has n/v post op  . GERD (gastroesophageal reflux disease)     no current meds for reflux  . Kidney stone     Past Surgical History  Procedure Laterality Date  . Appendectomy  04/01/2004    ex. lap.  . Knee surgery    . Cystoscopy with retrograde pyelogram, ureteroscopy and stent placement Left 05/05/2014    Procedure: LEFT  URETEROSCOPY,  RETROGRADE PYELOGRAM,  AND STENT PLACEMENT, STONE REMOVAL;  Surgeon: Crist Fat, MD;  Location: [facility];  Service: Urology;  Laterality: Left;  . Holmium laser application Left  05/05/2014    Procedure: HOLMIUM LASER APPLICATION;  Surgeon: Crist Fat, MD;  Location: [facility];  Service: Urology;  Laterality: Left;    OB History  Gravida Para Term Preterm AB SAB TAB Ectopic Multiple Living  # Outcome Date GA Lbr Len/2nd Weight Sex Delivery Anes PTL Lv  2 Current           1 Term               Social History   Social History  . Marital Status: Single    Spouse Name: N/A  . Number of Children: N/A  . Years of Education: N/A   Occupational History  . Not on file.   Social History Main Topics  . Smoking status: Former Smoker    Types: Cigars    Quit date: 09/24/2014  . Smokeless tobacco: Never Used  . Alcohol Use: Yes     Comment: occasional/ not since pregnancy  . Drug Use: No  . Sexual Activity: Yes   Other Topics Concern  . Not on file   Social History Narrative   Review of patient's allergies indicates no known allergies.     ABO, Rh: --/--/B POS (08/28 0015) Antibody: NEG (08/28 0015)  Other PNC: uncomplicated.    Filed Vitals:   11/30/14 1430  BP: 103/62  Pulse: 95  Temp: 98.4 F (36.9 C)  Resp: 18  General:  NAD Abdomen:  soft, gravid.  No fundal tenderness.  No tenderness to light or deep palpation in any quadrant.  Benign LUE: tenderness medial antecubital foss.  No discernable swelling.  + distal pulses, extremity warm.  No warmth to the antecubital fossa, erythema or palpable cords.  Pain w full flexion at elbow  LE: symmetric, no edema SVE:  deferred FHTs:  Present      A/P   24 y.o. G2P1001 [redacted]w[redacted]d presents with left upper extremity DVT Patient w likely right corpus luteum cyst.  However, given complaints of continued, but intermittent, RLQ, will admit for obs while initiating anti-coagulation to monitor for increasing abdominal pain.   DVT:  lovenox 1mg /kg bid therapeutic dosing for remainder of pregnancy = 60mg  bid now.  Cr normal at 0.6.  In the postpartum period, will transition to ppx  lovenox x 6 wks.  POC discussed w Dr. Claudean Severance, MFM, who agrees w above dosing regimen.  Pt was instructed on the risks of increased bleeding/easy bruising, need to call OB for any bleeding, likely plan to schedule EIOL at 39 wks to allow for neuraxial anesthesia.      [facility] GEFFEL [employer]

## 2014-11-30 NOTE — Progress Notes (Signed)
VASCULAR LAB PRELIMINARY  PRELIMINARY  PRELIMINARY  PRELIMINARY  Left upper extremity venous duplex completed.    Preliminary report:  Left:  DVT noted in the brachail vein. No evidence of superficial thrombosis   Janei Scheff, RVS 11/30/2014, 9:11 AM

## 2014-11-30 NOTE — ED Provider Notes (Addendum)
The patient has a DVT in her left arm. She is also [redacted] weeks pregnant iup and has a cyst on her right ovary. I spoke with Dr. Chestine Spore. she wants to admit the patient at [facility]. She wants to hold off on any blood thinners until the pt gets over to [facility].  Bethann Berkshire, MD 11/30/14 1307  Bethann Berkshire, MD 11/30/14 1308

## 2014-11-30 NOTE — ED Notes (Signed)
Pt c/o pain to L arm x 2 days. Was seen at [facility] for pregnancy complications in which IV and labs were drawn from L arm. Swelling and warmth noted to L upper arm; limited ROM due to pain, CMS intact

## 2014-11-30 NOTE — ED Provider Notes (Signed)
CSN: [id number]     Arrival date & time 11/30/14  0013 History   First MD Initiated Contact with Patient 11/30/14 0434     Chief Complaint  Patient presents with  . Arm Pain     (Consider location/radiation/quality/duration/timing/severity/associated sxs/prior Treatment) Patient is a 24 y.o. female presenting with arm pain. The history is provided by the patient.  Arm Pain  She has been having pain in her left arm for the last 4 days. Pain starts from her wrist and shoots up to her shoulder and into her neck and head. When it shoots into her head, it is severe but will resolve within about 15 minutes. Of note, she had an IV in her left wrist when she was evaluated at [facility] about 2 weeks ago. She denies nausea vomiting. She denies weakness or numbness. She has not taken anything for pain. She is currently pregnant with gestational age of [redacted] weeks 4 days.  Past Medical History  Diagnosis Date  . Complication of anesthesia   . Family history of adverse reaction to anesthesia     mother has n/v post op  . GERD (gastroesophageal reflux disease)     no current meds for reflux  . Kidney stone    Past Surgical History  Procedure Laterality Date  . Appendectomy  04/01/2004    ex. lap.  . Knee surgery    . Cystoscopy with retrograde pyelogram, ureteroscopy and stent placement Left 05/05/2014    Procedure: LEFT  URETEROSCOPY,  RETROGRADE PYELOGRAM,  AND STENT PLACEMENT, STONE REMOVAL;  Surgeon: Crist Fat, MD;  Location: [facility];  Service: Urology;  Laterality: Left;  . Holmium laser application Left 05/05/2014    Procedure: HOLMIUM LASER APPLICATION;  Surgeon: Crist Fat, MD;  Location: [facility];  Service: Urology;  Laterality: Left;   Family History  Problem Relation Age of Onset  . Anesthesia problems Mother     post-op N/V, hard to wake up  . Arthritis Mother    Social History  Substance Use Topics  . Smoking status: Former Smoker    Types: Cigars    Quit date:  09/24/2014  . Smokeless tobacco: Never Used  . Alcohol Use: Yes     Comment: occasional/ not since pregnancy   OB History    Gravida Para Term Preterm AB TAB SAB Ectopic Multiple Living   2 1 1       1      Review of Systems  All other systems reviewed and are negative.     Allergies  Review of patient's allergies indicates no known allergies.  Home Medications   Prior to Admission medications   Medication Sig Start Date End Date Taking? Authorizing Provider  ciprofloxacin-dexamethasone (CIPRODEX) otic suspension Place 4 drops into the right ear 2 (two) times daily. Patient not taking: Reported on 11/18/2014 09/17/14   Ozella Rocks, MD  folic acid (FOLVITE) 400 MCG tablet Take 400 mcg by mouth at bedtime.    Historical Provider, MD  Prenatal Vit-Fe Fumarate-FA (PRENATAL MULTIVITAMIN) TABS tablet Take 1 tablet by mouth daily.    Historical Provider, MD   BP 119/73 mmHg  Pulse 94  Temp(Src) 98.6 F (37 C) (Oral)  Resp 18  SpO2 99%  LMP 09/17/2014 Physical Exam  Nursing note and vitals reviewed.  24 year old female, resting comfortably and in no acute distress. Vital signs are normal. Oxygen saturation is 99%, which is normal. Head is normocephalic and atraumatic. PERRLA, EOMI. Oropharynx is  clear. Neck is nontender and supple without adenopathy or JVD. Back is nontender and there is no CVA tenderness. Lungs are clear without rales, wheezes, or rhonchi. Chest is nontender. Heart has regular rate and rhythm without murmur. Abdomen is soft, flat, nontender without masses or hepatosplenomegaly and peristalsis is normoactive. Extremities: IV puncture site is visible in the radial side of the left wrist with mild tenderness. There is moderate tenderness over the antecubital space and pain with full flexion. No cord is palpable. There are no epitrochlear or axillary lymph nodes palpable.. There is no erythema or warmth. Arm circumference is symmetric. Skin is warm and dry  without rash. Neurologic: Mental status is normal, cranial nerves are intact, there are no motor or sensory deficits.  ED Course  Procedures (including critical care time)  MDM   Final diagnoses:  Pain of left arm  First trimester pregnancy    Left arm pain of uncertain cause. With recent IV and nature of her symptoms, she will be sent for venous duplex scan to rule out DVT. I feel this is rather unlikely. In the meantime, she will be given symptomatic treatment. Case is signed out to Dr. Aileen Pilot to evaluate results of venous duplex scan.    Dione Booze, MD 11/30/14 [phone]

## 2014-11-30 NOTE — ED Notes (Signed)
Pt. reports left arm pain with swelling onset 1 1/2 weeks ago , pain radiating to left shoulder and left face ,  Pt. states pain stems from previous IV and venipuncture while she was at [facility] .

## 2014-11-30 NOTE — Care Management Note (Signed)
Case Management Note  Patient Details  Name: Erin Jacobson MRN: [id number] Date of Birth: [birth year]  Subjective/Objective:                  24 y.o. female presenting with arm pain.//Home with parent.  Action/Plan: Follow for disposition needs.  Expected Discharge Date:     11/30/2014             Expected Discharge Plan:  Home/Self Care  In-House Referral:  NA  Discharge planning Services  CM Consult, Medication Assistance  Post Acute Care Choice:  NA Choice offered to:  NA  DME Arranged:  N/A DME Agency:  NA  HH Arranged:  NA HH Agency:     Status of Service:  Completed, signed off  Medicare Important Message Given:    Date Medicare IM Given:    Medicare IM give by:    Date Additional Medicare IM Given:    Additional Medicare Important Message give by:     If discussed at Long Length of Stay Meetings, dates discussed:    Additional Comments: NCM consult for medication assistance for Xarelto.  NCM provided Xarelton 30-day free coupon and brochure. Oletta Cohn, RN 11/30/2014, 10:06 AM

## 2014-12-01 DIAGNOSIS — O2231 Deep phlebothrombosis in pregnancy, first trimester: Secondary | ICD-10-CM | POA: Diagnosis not present

## 2014-12-01 MED ORDER — ENOXAPARIN (LOVENOX) PATIENT EDUCATION KIT
PACK | Freq: Once | Status: DC
  Filled 2014-12-01: qty 1

## 2014-12-01 MED ORDER — ENOXAPARIN SODIUM 150 MG/ML ~~LOC~~ SOLN: 1.0000 mg/kg | Freq: Two times a day (BID) | SUBCUTANEOUS | Status: DC

## 2014-12-01 NOTE — Care Management Note (Signed)
Case Management Note  Patient Details  Name: Erin Jacobson MRN: [id number] Date of Birth: [birth year]  Subjective/Objective:24 year old female admitted 11/30/14 with DVT.                    Action/Plan:D/C when medically stable.        Additional CommentsPlease see below for medication coverage per pt's insurance.:                            benefits                  enoxaprian . $ 5.00 per month,[place] are: [facility], [facility].         Maritsa Hunsucker RNC-MNN, BSN 12/01/2014, 11:03 AM

## 2014-12-01 NOTE — Progress Notes (Signed)
Name: Erin Jacobson Medical Record Number:  [id number] Date of Birth: 1990-06-22 Date of Service: 12/01/2014  24 y.o. G2P1001 [redacted]w[redacted]d HD#1 admitted for 10WKS, DVT, LFT UPPER EXTREMITIES.  Pt currently stable with no c/o . She denies chest pain, shortness of breath, no vaginal bleeding, abdominal pain.  .  The patient's past medical history and prenatal records were reviewed.  Additional issues addressed and updated today: Patient Active Problem List   Diagnosis Date Noted  . DVT (deep vein thrombosis) in pregnancy 11/30/2014  . DVT during pregnancy, antepartum 11/30/2014  . Ectopic pregnancy with intrauterine pregnancy 11/19/2014  . Ectopic pregnancy 11/18/2014   Family History  Problem Relation Age of Onset  . Anesthesia problems Mother     post-op N/V, hard to wake up  . Arthritis Mother    Social History   Social History  . Marital Status: Single    Spouse Name: N/A  . Number of Children: N/A  . Years of Education: N/A   Social History Main Topics  . Smoking status: Former Smoker    Types: Cigars    Quit date: 09/24/2014  . Smokeless tobacco: Never Used  . Alcohol Use: Yes     Comment: occasional/ not since pregnancy  . Drug Use: No  . Sexual Activity: Yes   Other Topics Concern  . None   Social History Narrative   Filed Vitals:   12/01/14 0557  BP: 106/69  Pulse: 78  Temp: 98.8 F (37.1 C)  Resp: 16     Physical Examination:   Filed Vitals:   12/01/14 0557  BP: 106/69  Pulse: 78  Temp: 98.8 F (37.1 C)  Resp: 16   General appearance - alert, well appearing, and in no distress and oriented to person, place, and time Mental status - alert, oriented to person, place, and time, normal mood, behavior, speech, dress, motor activity, and thought processes Lungs CTA Cor RRR Abd  Soft,  nontender Upper extremity: no swelling or edema  Cervix: not evaluated  Results for orders placed or performed during the hospital encounter of 11/30/14 (from the past 24  hour(s))  Type and screen     Status: None   Collection Time: 11/30/14  4:38 PM  Result Value Ref Range   ABO/RH(D) B POS    Antibody Screen NEG    Sample Expiration 12/03/2014   Basic metabolic panel     Status: Abnormal   Collection Time: 11/30/14  4:38 PM  Result Value Ref Range   Sodium 139 135 - 145 mmol/L   Potassium 3.8 3.5 - 5.1 mmol/L   Chloride 109 101 - 111 mmol/L   CO2 17 (L) 22 - 32 mmol/L   Glucose, Bld 71 65 - 99 mg/dL   BUN 7 6 - 20 mg/dL   Creatinine, Ser 4.09 0.44 - 1.00 mg/dL   Calcium 9.2 8.9 - 81.1 mg/dL   GFR calc non Af Amer >60 >60 mL/min   GFR calc Af Amer >60 >60 mL/min   Anion gap 13 5 - 15  CBC     Status: Abnormal   Collection Time: 11/30/14  4:38 PM  Result Value Ref Range   WBC 11.9 (H) 4.0 - 10.5 K/uL   RBC 4.09 3.87 - 5.11 MIL/uL   Hemoglobin 12.0 12.0 - 15.0 g/dL   HCT 91.4 (L) 78.2 - 95.6 %   MCV 84.6 78.0 - 100.0 fL   MCH 29.3 26.0 - 34.0 pg   MCHC 34.7 30.0 - 36.0 g/dL  RDW 13.9 11.5 - 15.5 %   Platelets 303 150 - 400 K/uL    A:  HD#1  24 y.o. G2P1001 [redacted]w[redacted]d presents with left upper extremity DVT  Patient w likely right corpus luteum cyst. However, given complaints of continued, but intermittent, RLQ, will admit for obs while initiating anti-coagulation to monitor for increasing abdominal pain.  DVT: lovenox /kg bid therapeutic dosing for remainder of pregnancy =  bid now. Cr normal at 0.6. In the postpartum period, will transition to ppx lovenox x 6 wks. POC discussed w Dr. Claudean Severance, MFM, who agrees w above dosing regimen. Pt was instructed on the risks of increased bleeding/easy bruising, need to call OB for any bleeding, likely plan to schedule EIOL at 39 wks to allow for neuraxial anesthesia.  Plan: Instructions given to patient on how to administer lovenox Pulmonary Embolus precautions Patient has close interval f/u appointment at office [facility], [facility]

## 2014-12-01 NOTE — Discharge Summary (Signed)
Physician Discharge Summary  Patient ID: Erin Jacobson MRN: [id number] DOB/AGE: 06-08-1990 24 y.o.  Admit date: 11/30/2014 Discharge date: 12/01/2014  Admission Diagnoses:  DVT in pregnancy  Discharge Diagnoses:  Active Problems:   DVT during pregnancy, antepartum   Discharged Condition: stable  Hospital Course: 24 y.o. G2P1001 [redacted]w[redacted]d presents with left upper extremity DVT Patient w likely right corpus luteum cyst. However, given complaints of continued, but intermittent, RLQ, will admit for obs while initiating anti-coagulation to monitor for increasing abdominal pain.  DVT: lovenox 1mg /kg bid therapeutic dosing for remainder of pregnancy = 60mg  bid now. Cr normal at 0.6. In the postpartum period, will transition to ppx lovenox x 6 wks. POC discussed w Dr. Claudean Severance, MFM, who agrees w above dosing regimen. Pt was instructed on the risks of increased bleeding/easy bruising, need to call OB for any bleeding, likely plan to schedule EIOL at 39 wks to allow for neuraxial anesthesia.  Consults: MFM  Significant Diagnostic Studies: labs: CMP normal / Upper extrem doppler  Treatments: anticoagulation: LMW heparin  Discharge Exam: Blood pressure 106/69, pulse 78, temperature 98.8 F (37.1 C), temperature source Oral, resp. rate 16, height 5' 0.5" (1.537 m), weight 61.803 kg (136 lb 4 oz), last menstrual period 09/17/2014, SpO2 99 %. General appearance - alert, well appearing, and in no distress and oriented to person, place, and time Mental status - alert, oriented to person, place, and time, normal mood, behavior, speech, dress, motor activity, and thought processes Lungs CTA Cor RRR Abd Soft, nontender Upper extremity: no swelling or edema  Cervix: not evaluated  Disposition: 02-Short Term Hospital  Discharge Instructions    Anticogulant already ordered    Complete by:  As directed      Call MD for:  difficulty breathing, headache or visual disturbances    Complete by:  As  directed      Call MD for:  hives    Complete by:  As directed      Call MD for:  persistant dizziness or light-headedness    Complete by:  As directed      Call MD for:  persistant nausea and vomiting    Complete by:  As directed      Call MD for:  redness, tenderness, or signs of infection (pain, swelling, redness, odor or green/yellow discharge around incision site)    Complete by:  As directed      Call MD for:  severe uncontrolled pain    Complete by:  As directed      Call MD for:  temperature >100.4    Complete by:  As directed      Discharge activity:  No Restrictions    Complete by:  As directed      Discharge diet:  No restrictions    Complete by:  As directed      Discharge instructions    Complete by:  As directed   Please call if you have any questions or concerns     Increase activity slowly    Complete by:  As directed      No sexual activity restrictions    Complete by:  As directed             Medication List    TAKE these medications        enoxaparin 150 MG/ML injection  Commonly known as:  LOVENOX  Inject 0.41 mLs (60 mg total) into the skin every 12 (twelve) hours.     folic acid 400  MCG tablet  Commonly known as:  FOLVITE  Take 400 mcg by mouth at bedtime.     prenatal multivitamin Tabs tablet  Take 1 tablet by mouth daily.         SignedWynonia Hazard 12/01/2014, 9:51 AM

## 2014-12-07 ENCOUNTER — Encounter (HOSPITAL_COMMUNITY): Payer: Self-pay

## 2014-12-07 ENCOUNTER — Emergency Department (HOSPITAL_COMMUNITY)
Admission: EM | Admit: 2014-12-07 | Discharge: 2014-12-07 | Discharge status: Absent without leave | Payer: No Typology Code available for payment source

## 2014-12-07 ENCOUNTER — Inpatient Hospital Stay (HOSPITAL_COMMUNITY)
Admission: AD | Admit: 2014-12-07 | Discharge: 2014-12-08 | Disposition: A | Discharge status: Home / Self Care | Payer: BLUE CROSS/BLUE SHIELD | Source: Ambulatory Visit | Attending: Emergency Medicine | Admitting: Emergency Medicine

## 2014-12-07 DIAGNOSIS — R079 Chest pain, unspecified: Secondary | ICD-10-CM | POA: Diagnosis present

## 2014-12-07 DIAGNOSIS — R0602 Shortness of breath: Secondary | ICD-10-CM

## 2014-12-07 DIAGNOSIS — Z87891 Personal history of nicotine dependence: Secondary | ICD-10-CM | POA: Insufficient documentation

## 2014-12-07 DIAGNOSIS — Z3A11 11 weeks gestation of pregnancy: Secondary | ICD-10-CM | POA: Insufficient documentation

## 2014-12-07 DIAGNOSIS — Z87442 Personal history of urinary calculi: Secondary | ICD-10-CM | POA: Diagnosis not present

## 2014-12-07 DIAGNOSIS — O26891 Other specified pregnancy related conditions, first trimester: Secondary | ICD-10-CM | POA: Diagnosis not present

## 2014-12-07 NOTE — ED Provider Notes (Addendum)
CSN: [id number]   Arrival date & time 12/07/14 2217  History  This chart was scribed for Derwood Kaplan, MD by Bethel Born, ED Scribe. This patient was seen in room D31C/D31C and the patient's care was started at 12:40 AM.  Chief Complaint  Patient presents with  . Chest Pain    HPI The history is provided by the patient. No language interpreter was used.   Erin Jacobson is a 24 y.o. G2  female who is [redacted] weeks pregnant with PMHx of DVT who presents to the Emergency Department complaining of new and intermittent chest pain with  onset today while cooking. The pain is located in the central chest and under the left breast. She describes the pain as sharp below the breast and dull in the central chest. Movement causes a tightness in the central chest that is absent at rest.The pain is not worse with breathing. Associated symptoms include SOB. She was seen initially at [facility] and transferred to [facility]. Her first pregnancy was uncomplicated. She is on Lovenox for a clot in the left arm that was diagnosed 1 week ago. The clot was diagnosed by [place]. Pt denies tobacco use and any history of substance abuse.   Past Medical History  Diagnosis Date  . Complication of anesthesia   . Family history of adverse reaction to anesthesia     mother has n/v post op  . GERD (gastroesophageal reflux disease)     no current meds for reflux  . Kidney stone   . Arm vein blood clot     Past Surgical History  Procedure Laterality Date  . Appendectomy  04/01/2004    ex. lap.  . Knee surgery    . Cystoscopy with retrograde pyelogram, ureteroscopy and stent placement Left 05/05/2014    Procedure: LEFT  URETEROSCOPY,  RETROGRADE PYELOGRAM,  AND STENT PLACEMENT, STONE REMOVAL;  Surgeon: Crist Fat, MD;  Location: [facility];  Service: Urology;  Laterality: Left;  . Holmium laser application Left 05/05/2014    Procedure: HOLMIUM LASER APPLICATION;  Surgeon: Crist Fat, MD;  Location: [facility];   Service: Urology;  Laterality: Left;    Family History  Problem Relation Age of Onset  . Anesthesia problems Mother     post-op N/V, hard to wake up  . Arthritis Mother     Social History  Substance Use Topics  . Smoking status: Former Smoker    Types: Cigars    Quit date: 09/24/2014  . Smokeless tobacco: Never Used  . Alcohol Use: Yes     Comment: occasional/ not since pregnancy     Review of Systems  Constitutional: Negative for fever and chills.  Respiratory: Positive for shortness of breath.   Cardiovascular: Positive for chest pain.  Gastrointestinal: Negative for nausea and vomiting.  Neurological: Negative for weakness.   Home Medications   Prior to Admission medications   Medication Sig Start Date End Date Taking? Authorizing Provider  doxylamine, Sleep, (UNISOM) 25 MG tablet Take 25 mg by mouth at bedtime as needed.   Yes Historical Provider, MD  enoxaparin (LOVENOX) 60 MG/0.6ML injection Inject 0.6 mLs into the skin every 12 (twelve) hours. 12/01/14  Yes Historical Provider, MD  folic acid (FOLVITE) 400 MCG tablet Take 400 mcg by mouth at bedtime.   Yes Historical Provider, MD  Prenatal Vit-Fe Fumarate-FA (PRENATAL MULTIVITAMIN) TABS tablet Take 1 tablet by mouth daily.   Yes Historical Provider, MD    Allergies  Review of patient's allergies  indicates no known allergies.  Triage Vitals: BP 123/71 mmHg  Pulse 96  Resp 24  SpO2 100%  LMP 09/17/2014  Physical Exam  Constitutional: She is oriented to person, place, and time. She appears well-developed and well-nourished. No distress.  HENT:  Head: Normocephalic and atraumatic.  Eyes: EOM are normal.  Neck: Normal range of motion.  Cardiovascular: Normal rate and regular rhythm.   Murmur heard. Systolic murmur HR 94 bpm  Pulmonary/Chest: Effort normal and breath sounds normal.  Lungs CTAB Chest wall pain is not reproducible with palpation  Abdominal: Soft. She exhibits no distension. There is no  tenderness.  Musculoskeletal: Normal range of motion.  No LE unilateral swelling No pitting edema  Neurological: She is alert and oriented to person, place, and time.  Skin: Skin is warm and dry.  Psychiatric: She has a normal mood and affect. Judgment normal.  Nursing note and vitals reviewed.   ED Course  Procedures   DIAGNOSTIC STUDIES: Oxygen Saturation is 100% on RA, normal by my interpretation.    COORDINATION OF CARE:  12:48 AM Discussed treatment plan which includes lab work and EKG with pt at bedside and pt agreed to plan.  Labs Reviewed  CBC WITH DIFFERENTIAL/PLATELET - Abnormal; Notable for the following:    Hemoglobin 11.5 (*)    HCT 33.5 (*)    All other components within normal limits  BASIC METABOLIC PANEL - Abnormal; Notable for the following:    CO2 21 (*)    Glucose, Bld 100 (*)    BUN 5 (*)    All other components within normal limits  URINALYSIS, ROUTINE W REFLEX MICROSCOPIC (NOT AT [facility]) - Abnormal; Notable for the following:    APPearance CLOUDY (*)    Specific Gravity, Urine 1.031 (*)    Ketones, ur 15 (*)    Leukocytes, UA MODERATE (*)    All other components within normal limits  URINE MICROSCOPIC-ADD ON - Abnormal; Notable for the following:    Squamous Epithelial / LPF MANY (*)    Bacteria, UA MANY (*)    All other components within normal limits  BRAIN NATRIURETIC PEPTIDE  TROPONIN I    Imaging Review No results found.  EKG Interpretation  Date/Time:    Ventricular Rate:    PR Interval:    QRS Duration:   QT Interval:    QTC Calculation:   R Axis:     Text Interpretation:    ED ECG REPORT   Date: 12/08/2014  Rate: 90  Rhythm: normal sinus rhythm  QRS Axis: normal  Intervals: normal  ST/T Wave abnormalities: nonspecific ST/T changes  Conduction Disutrbances:none  Narrative Interpretation:   Old EKG Reviewed: none available [id number] pattern noted I have personally reviewed the EKG tracing and agree with the computerized  printout as noted.   MDM   Final diagnoses:  Chest pain, unspecified chest pain type  Shortness of breath     I personally performed the services described in this documentation, which was scribed in my presence. The recorded information has been reviewed and is accurate.  Pt comes in with cc of chest pain. She is pregnant, in her 1st trimester. She has new DVT, on lovenox currently. When to [facility], sent here for r/o PE. She is having chest pain and dib, with some pleuritic component to it. Labs are benign, including bnp and trop. EKG has some right sided strain.  Spoke with patient about CT PE. Informed her that the risk of radiation is  there is pregnancy, but it is not as high as CT abdomen, and not as high now compared to 3rd trimester. Informed that the conservative option would be to continue lovenox, and to return to the Er if the symptoms are getting worse, and the aggressive option would be to get CT PE. Also discussed that if she has a PE, it is less likely to be a large PE given the lab findings, but PE can be mortal.  She weighed the options and decided to get CT PE. With hx of DVT, with her being pregnant - i dont see any utility in getting dimer.   Derwood Kaplan, MD 12/08/14 1610  Derwood Kaplan, MD 12/08/14 [id number]

## 2014-12-07 NOTE — ED Notes (Signed)
Pt transferred from MAU due to chest pain that started earlier this evening; suspected PE per EMS; Pt is [redacted] weeks pregnant with 2nd child; Pt c/o chest pain 3/10 on arrival.

## 2014-12-07 NOTE — MAU Note (Signed)
[facility] here to transfer patient 

## 2014-12-07 NOTE — MAU Provider Note (Signed)
History     CSN: [id number]  Arrival date and time: 12/07/14 2216   First Provider Initiated Contact with Patient 12/07/14 2225      No chief complaint on file.  HPI Comments: Erin Jacobson is a 24 y.o. G2P1001 at [redacted]w[redacted]d who presents today with chest pain and shortness of breath. She states that it started earlier in the day, and has gotten worse. She states that she was dx with a blood clot in left arm about a week ago, and is on lovenox. She reports pain in her right leg.   Shortness of Breath This is a new problem. The current episode started today. The problem occurs constantly. The problem has been unchanged. Associated symptoms include chest pain. Pertinent negatives include no abdominal pain, fever or vomiting. Nothing aggravates the symptoms. Risk factors: pregnant, known clot in left arm. She has tried nothing for the symptoms.     Past Medical History  Diagnosis Date  . Complication of anesthesia   . Family history of adverse reaction to anesthesia     mother has n/v post op  . GERD (gastroesophageal reflux disease)     no current meds for reflux  . Kidney stone     Past Surgical History  Procedure Laterality Date  . Appendectomy  04/01/2004    ex. lap.  . Knee surgery    . Cystoscopy with retrograde pyelogram, ureteroscopy and stent placement Left 05/05/2014    Procedure: LEFT  URETEROSCOPY,  RETROGRADE PYELOGRAM,  AND STENT PLACEMENT, STONE REMOVAL;  Surgeon: Crist Fat, MD;  Location: [facility];  Service: Urology;  Laterality: Left;  . Holmium laser application Left 05/05/2014    Procedure: HOLMIUM LASER APPLICATION;  Surgeon: Crist Fat, MD;  Location: [facility];  Service: Urology;  Laterality: Left;    Family History  Problem Relation Age of Onset  . Anesthesia problems Mother     post-op N/V, hard to wake up  . Arthritis Mother     Social History  Substance Use Topics  . Smoking status: Former Smoker    Types: Cigars    Quit date: 09/24/2014  .  Smokeless tobacco: Never Used  . Alcohol Use: Yes     Comment: occasional/ not since pregnancy    Allergies: No Known Allergies  Prescriptions prior to admission  Medication Sig Dispense Refill Last Dose  . enoxaparin (LOVENOX) 150 MG/ML injection Inject 0.41 mLs (60 mg total) into the skin every 12 (twelve) hours. 30 Syringe 12   . folic acid (FOLVITE) 400 MCG tablet Take 400 mcg by mouth at bedtime.   11/29/2014 at Unknown time  . Prenatal Vit-Fe Fumarate-FA (PRENATAL MULTIVITAMIN) TABS tablet Take 1 tablet by mouth daily.   11/29/2014 at Unknown time    Review of Systems  Constitutional: Negative for fever.  Respiratory: Positive for shortness of breath.   Cardiovascular: Positive for chest pain.  Gastrointestinal: Positive for nausea. Negative for vomiting, abdominal pain, diarrhea and constipation.   Physical Exam   Last menstrual period 09/17/2014.  Physical Exam  Nursing note and vitals reviewed. Constitutional: She is oriented to person, place, and time. She appears well-developed and well-nourished. No distress.  HENT:  Head: Normocephalic.  Cardiovascular: Normal rate.   Respiratory: Effort normal.  GI: Soft. There is no tenderness. There is no rebound.  Musculoskeletal: She exhibits no edema.  Neurological: She is alert and oriented to person, place, and time.  Skin: Skin is warm and dry.  Psychiatric: She has a normal  mood and affect.    MAU Course  Procedures  MDM 2233: DW. Dr. Henderson Cloud 2234 Dr. Verdie Mosher  accepts transfer  2305: Patient leaving the unit with EMS at this time. Appears stable.   Assessment and Plan   1. Chest pain, unspecified chest pain type   2. Shortness of breath    Transfer to [facility] via EMS  Tawnya Crook 12/07/2014, 10:26 PM

## 2014-12-08 ENCOUNTER — Encounter (HOSPITAL_COMMUNITY): Payer: Self-pay

## 2014-12-08 ENCOUNTER — Inpatient Hospital Stay (HOSPITAL_COMMUNITY): Payer: BLUE CROSS/BLUE SHIELD

## 2014-12-08 DIAGNOSIS — O26891 Other specified pregnancy related conditions, first trimester: Secondary | ICD-10-CM | POA: Diagnosis not present

## 2014-12-08 LAB — CBC WITH DIFFERENTIAL/PLATELET
BASOS PCT: 0 %
Basophils Absolute: 0 10*3/uL (ref 0.0–0.1)
Eosinophils Absolute: 0.2 10*3/uL (ref 0.0–0.7)
Eosinophils Relative: 2 %
HEMATOCRIT: 33.5 % — AB (ref 36.0–46.0)
HEMOGLOBIN: 11.5 g/dL — AB (ref 12.0–15.0)
Lymphocytes Relative: 28 %
Lymphs Abs: 2.9 10*3/uL (ref 0.7–4.0)
MCH: 28.9 pg (ref 26.0–34.0)
MCHC: 34.3 g/dL (ref 30.0–36.0)
MCV: 84.2 fL (ref 78.0–100.0)
MONOS PCT: 7 %
Monocytes Absolute: 0.8 10*3/uL (ref 0.1–1.0)
NEUTROS ABS: 6.5 10*3/uL (ref 1.7–7.7)
NEUTROS PCT: 63 %
Platelets: 305 10*3/uL (ref 150–400)
RBC: 3.98 MIL/uL (ref 3.87–5.11)
RDW: 13.1 % (ref 11.5–15.5)
WBC: 10.4 10*3/uL (ref 4.0–10.5)

## 2014-12-08 LAB — BASIC METABOLIC PANEL
ANION GAP: 9 (ref 5–15)
BUN: 5 mg/dL — ABNORMAL LOW (ref 6–20)
CHLORIDE: 108 mmol/L (ref 101–111)
CO2: 21 mmol/L — AB (ref 22–32)
Calcium: 9.1 mg/dL (ref 8.9–10.3)
Creatinine, Ser: 0.61 mg/dL (ref 0.44–1.00)
GFR calc non Af Amer: >60 mL/min (ref >60)
Glucose, Bld: 100 mg/dL — ABNORMAL HIGH (ref 65–99)
POTASSIUM: 3.7 mmol/L (ref 3.5–5.1)
Sodium: 138 mmol/L (ref 135–145)

## 2014-12-08 LAB — URINALYSIS, ROUTINE W REFLEX MICROSCOPIC
Bilirubin Urine: NEGATIVE
Glucose, UA: NEGATIVE mg/dL
Hgb urine dipstick: NEGATIVE
KETONES UR: 15 mg/dL — AB
NITRITE: NEGATIVE
PROTEIN: NEGATIVE mg/dL
Specific Gravity, Urine: 1.031 — ABNORMAL HIGH (ref 1.005–1.030)
UROBILINOGEN UA: 1 mg/dL (ref 0.0–1.0)
pH: 6.5 (ref 5.0–8.0)

## 2014-12-08 LAB — URINE MICROSCOPIC-ADD ON

## 2014-12-08 LAB — BRAIN NATRIURETIC PEPTIDE: B NATRIURETIC PEPTIDE 5: 18.3 pg/mL (ref 0.0–100.0)

## 2014-12-08 LAB — TROPONIN I

## 2014-12-08 MED ORDER — IOHEXOL 350 MG/ML SOLN
100.0000 mL | Freq: Once | INTRAVENOUS | Status: AC | PRN
  Administered 2014-12-08: 100 mL via INTRAVENOUS

## 2014-12-08 NOTE — Discharge Instructions (Signed)
We saw you in the ER for the chest pain. All the results in the ER are normal, labs and imaging. We are not sure what is causing your symptoms. The workup in the ER is not complete, and is limited to screening for life threatening and emergent conditions only, so please see a primary care doctor for further evaluation.   Chest Pain (Nonspecific) It is often hard to give a specific diagnosis for the cause of chest pain. There is always a chance that your pain could be related to something serious, such as a heart attack or a blood clot in the lungs. You need to follow up with your health care provider for further evaluation. CAUSES   Heartburn.  Pneumonia or bronchitis.  Anxiety or stress.  Inflammation around your heart (pericarditis) or lung (pleuritis or pleurisy).  A blood clot in the lung.  A collapsed lung (pneumothorax). It can develop suddenly on its own (spontaneous pneumothorax) or from trauma to the chest.  Shingles infection (herpes zoster virus). The chest wall is composed of bones, muscles, and cartilage. Any of these can be the source of the pain.  The bones can be bruised by injury.  The muscles or cartilage can be strained by coughing or overwork.  The cartilage can be affected by inflammation and become sore (costochondritis). DIAGNOSIS  Lab tests or other studies may be needed to find the cause of your pain. Your health care provider may have you take a test called an ambulatory electrocardiogram (ECG). An ECG records your heartbeat patterns over a 24-hour period. You may also have other tests, such as:  Transthoracic echocardiogram (TTE). During echocardiography, sound waves are used to evaluate how blood flows through your heart.  Transesophageal echocardiogram (TEE).  Cardiac monitoring. This allows your health care provider to monitor your heart rate and rhythm in real time.  Holter monitor. This is a portable device that records your heartbeat and can  help diagnose heart arrhythmias. It allows your health care provider to track your heart activity for several days, if needed.  Stress tests by exercise or by giving medicine that makes the heart beat faster. TREATMENT   Treatment depends on what may be causing your chest pain. Treatment may include:  Acid blockers for heartburn.  Anti-inflammatory medicine.  Pain medicine for inflammatory conditions.  Antibiotics if an infection is present.  You may be advised to change lifestyle habits. This includes stopping smoking and avoiding alcohol, caffeine, and chocolate.  You may be advised to keep your head raised (elevated) when sleeping. This reduces the chance of acid going backward from your stomach into your esophagus. Most of the time, nonspecific chest pain will improve within 2-3 days with rest and mild pain medicine.  HOME CARE INSTRUCTIONS   If antibiotics were prescribed, take them as directed. Finish them even if you start to feel better.  For the next few days, avoid physical activities that bring on chest pain. Continue physical activities as directed.  Do not use any tobacco products, including cigarettes, chewing tobacco, or electronic cigarettes.  Avoid drinking alcohol.  Only take medicine as directed by your health care provider.  Follow your health care provider's suggestions for further testing if your chest pain does not go away.  Keep any follow-up appointments you made. If you do not go to an appointment, you could develop lasting (chronic) problems with pain. If there is any problem keeping an appointment, call to reschedule. SEEK MEDICAL CARE IF:   Your chest  pain does not go away, even after treatment.  You have a rash with blisters on your chest.  You have a fever. SEEK IMMEDIATE MEDICAL CARE IF:   You have increased chest pain or pain that spreads to your arm, neck, jaw, back, or abdomen.  You have shortness of breath.  You have an increasing  cough, or you cough up blood.  You have severe back or abdominal pain.  You feel nauseous or vomit.  You have severe weakness.  You faint.  You have chills. This is an emergency. Do not wait to see if the pain will go away. Get medical help at once. Call your local emergency services (911 in [place] Do not drive yourself to the hospital. MAKE SURE YOU:   Understand these instructions.  Will watch your condition.  Will get help right away if you are not doing well or get worse. Document Released: 12/18/2004 Document Revised: 03/15/2013 Document Reviewed: 10/14/2007 [facility] Patient Information 2015 [place], [place]. This information is not intended to replace advice given to you by your health care provider. Make sure you discuss any questions you have with your health care provider.

## 2014-12-08 NOTE — ED Notes (Signed)
Patient transported to CT 

## 2014-12-18 LAB — OB RESULTS CONSOLE RUBELLA ANTIBODY, IGM: Rubella: IMMUNE

## 2014-12-18 LAB — OB RESULTS CONSOLE GC/CHLAMYDIA
Chlamydia: NEGATIVE
Gonorrhea: NEGATIVE

## 2014-12-18 LAB — OB RESULTS CONSOLE ANTIBODY SCREEN: ANTIBODY SCREEN: NEGATIVE

## 2014-12-18 LAB — OB RESULTS CONSOLE HIV ANTIBODY (ROUTINE TESTING): HIV: NONREACTIVE

## 2014-12-18 LAB — OB RESULTS CONSOLE RPR: RPR: NONREACTIVE

## 2014-12-18 LAB — OB RESULTS CONSOLE ABO/RH: RH TYPE: POSITIVE

## 2014-12-18 LAB — OB RESULTS CONSOLE HEPATITIS B SURFACE ANTIGEN: HEP B S AG: NEGATIVE

## 2015-01-11 ENCOUNTER — Encounter (HOSPITAL_COMMUNITY): Payer: Self-pay | Admitting: *Deleted

## 2015-01-11 ENCOUNTER — Inpatient Hospital Stay (HOSPITAL_COMMUNITY): Payer: BLUE CROSS/BLUE SHIELD

## 2015-01-11 ENCOUNTER — Inpatient Hospital Stay (HOSPITAL_COMMUNITY)
Admission: AD | Admit: 2015-01-11 | Discharge: 2015-01-11 | Discharge status: Against Medical Advice | Payer: BLUE CROSS/BLUE SHIELD | Source: Ambulatory Visit | Attending: Obstetrics | Admitting: Obstetrics

## 2015-01-11 ENCOUNTER — Inpatient Hospital Stay (EMERGENCY_DEPARTMENT_HOSPITAL)
Admission: AD | Admit: 2015-01-11 | Discharge: 2015-01-11 | Disposition: A | Discharge status: Home / Self Care | Payer: BLUE CROSS/BLUE SHIELD | Source: Ambulatory Visit | Attending: Obstetrics | Admitting: Obstetrics

## 2015-01-11 DIAGNOSIS — O26892 Other specified pregnancy related conditions, second trimester: Secondary | ICD-10-CM | POA: Insufficient documentation

## 2015-01-11 DIAGNOSIS — Z7901 Long term (current) use of anticoagulants: Secondary | ICD-10-CM | POA: Insufficient documentation

## 2015-01-11 DIAGNOSIS — Z86718 Personal history of other venous thrombosis and embolism: Secondary | ICD-10-CM | POA: Diagnosis not present

## 2015-01-11 DIAGNOSIS — Z87891 Personal history of nicotine dependence: Secondary | ICD-10-CM | POA: Diagnosis not present

## 2015-01-11 DIAGNOSIS — K219 Gastro-esophageal reflux disease without esophagitis: Secondary | ICD-10-CM | POA: Diagnosis not present

## 2015-01-11 DIAGNOSIS — Z3A16 16 weeks gestation of pregnancy: Secondary | ICD-10-CM | POA: Diagnosis not present

## 2015-01-11 DIAGNOSIS — R12 Heartburn: Secondary | ICD-10-CM | POA: Diagnosis present

## 2015-01-11 DIAGNOSIS — R079 Chest pain, unspecified: Secondary | ICD-10-CM

## 2015-01-11 DIAGNOSIS — Z87442 Personal history of urinary calculi: Secondary | ICD-10-CM | POA: Insufficient documentation

## 2015-01-11 MED ORDER — IOHEXOL 350 MG/ML SOLN
100.0000 mL | Freq: Once | INTRAVENOUS | Status: AC | PRN
  Administered 2015-01-11: 100 mL via INTRAVENOUS

## 2015-01-11 MED ORDER — PANTOPRAZOLE SODIUM 40 MG PO TBEC
80.0000 mg | DELAYED_RELEASE_TABLET | Freq: Every day | ORAL | Status: DC
  Filled 2015-01-11 (×2): qty 2

## 2015-01-11 MED ORDER — OMEPRAZOLE 20 MG PO CPDR: 20.0000 mg | DELAYED_RELEASE_CAPSULE | Freq: Two times a day (BID) | ORAL | Status: DC

## 2015-01-11 MED ORDER — GI COCKTAIL ~~LOC~~
30.0000 mL | Freq: Once | ORAL | Status: AC
  Administered 2015-01-11: 30 mL via ORAL
  Filled 2015-01-11: qty 30

## 2015-01-11 MED ORDER — PANTOPRAZOLE SODIUM 40 MG PO TBEC
80.0000 mg | DELAYED_RELEASE_TABLET | Freq: Once | ORAL | Status: AC
  Administered 2015-01-11: 80 mg via ORAL
  Filled 2015-01-11: qty 2

## 2015-01-11 NOTE — MAU Note (Signed)
Pt presents to MAU with complaints of heartburn. States she was evaluated earlier today but had to leave to pick up her child

## 2015-01-11 NOTE — MAU Note (Signed)
Pt back for further eval. Had to leave to pick up child. Still has not gotten much relief from her indigestion.

## 2015-01-11 NOTE — MAU Note (Signed)
Urine sent to lab 

## 2015-01-11 NOTE — MAU Provider Note (Signed)
History     CSN: [id number]  Arrival date and time: 01/11/15 1638   None     Chief Complaint  Patient presents with  . Heartburn   HPI   [name] is a 24 y.o. female G2P1001 at [redacted]w[redacted]d presenting with chest pain and heartburn. She was seen earlier today with the same symptoms; she received a GI cocktail without relief. I recommended a spiral CT at that time and the patient had to leave to go get her small child. She is back and states that symptoms remain the same.   Patient arrived back to MAU; complaints are the same. She is still complaining of chest pain Vs heart burn. She is unable to tell if it is directly related to heart burn. She currently rates her pain 2/10.   Last time she took lovenox was today, one dose; she did not take it as prescribed. She only took it once. She has been skipping days. She did not take lovenox yesterday.    OB History    Gravida Para Term Preterm AB TAB SAB Ectopic Multiple Living   Past Medical History  Diagnosis Date  . Complication of anesthesia   . Family history of adverse reaction to anesthesia     mother has n/v post op  . GERD (gastroesophageal reflux disease)     no current meds for reflux  . Kidney stone   . Arm vein blood clot     Past Surgical History  Procedure Laterality Date  . Appendectomy  04/01/2004    ex. lap.  . Knee surgery    . Cystoscopy with retrograde pyelogram, ureteroscopy and stent placement Left 05/05/2014    Procedure: LEFT  URETEROSCOPY,  RETROGRADE PYELOGRAM,  AND STENT PLACEMENT, STONE REMOVAL;  Surgeon: Crist Fat, MD;  Location: [facility];  Service: Urology;  Laterality: Left;  . Holmium laser application Left 05/05/2014    Procedure: HOLMIUM LASER APPLICATION;  Surgeon: Crist Fat, MD;  Location: [facility];  Service: Urology;  Laterality: Left;    Family History  Problem Relation Age of Onset  . Anesthesia problems Mother     post-op N/V, hard to wake up  .  Arthritis Mother     Social History  Substance Use Topics  . Smoking status: Former Smoker    Types: Cigars    Quit date: 09/24/2014  . Smokeless tobacco: Never Used  . Alcohol Use: Yes     Comment: occasional/ not since pregnancy    Allergies: No Known Allergies  Prescriptions prior to admission  Medication Sig Dispense Refill Last Dose  . acetaminophen (TYLENOL) 325 MG tablet Take 650 mg by mouth daily as needed for mild pain or moderate pain.   Past Week at Unknown time  . enoxaparin (LOVENOX) 60 MG/0.6ML injection Inject 0.6 mLs into the skin every 12 (twelve) hours.  12 01/11/2015 at Unknown time  . folic acid (FOLVITE) 400 MCG tablet Take 400 mcg by mouth at bedtime.   01/10/2015 at Unknown time  . Prenatal Vit-Fe Fumarate-FA (PRENATAL MULTIVITAMIN) TABS tablet Take 1 tablet by mouth daily.   01/11/2015 at Unknown time   No results found for this or any previous visit (from the past 24 hour(s)).  Review of Systems  Constitutional: Negative for fever and chills.  Eyes: Negative for blurred vision and double vision.  Respiratory: Negative for shortness of breath.   Cardiovascular: Positive  for chest pain. Negative for palpitations.  Gastrointestinal: Positive for heartburn.  Neurological: Negative for dizziness and headaches.   Physical Exam   Blood pressure 123/72, pulse 107, temperature 98.1 F (36.7 C), resp. rate 18, last menstrual period 09/17/2014, SpO2 99 %.  Physical Exam  Nursing note and vitals reviewed. Constitutional: She is oriented to person, place, and time. She appears well-developed and well-nourished.  Non-toxic appearance. She does not have a sickly appearance. She does not appear ill. No distress.  HENT:  Head: Normocephalic.  Cardiovascular: Normal rate and normal heart sounds.   Respiratory: Effort normal. No respiratory distress. She has no wheezes. She has no rales. She exhibits no tenderness.  Musculoskeletal: Normal range of motion.   Neurological: She is alert and oriented to person, place, and time.  Skin: Skin is warm. She is not diaphoretic.  Psychiatric: Her behavior is normal.   Ct Angio Chest Pe W/cm &/or Wo Cm  01/11/2015  CLINICAL DATA:  Pregnant patient with chest pain, known left upper extremity DVT. In consistent compliance with Lovenox. EXAM: CT ANGIOGRAPHY CHEST WITH CONTRAST TECHNIQUE: Multidetector CT imaging of the chest was performed using the standard protocol during bolus administration of intravenous contrast. Multiplanar CT image reconstructions and MIPs were obtained to evaluate the vascular anatomy. CONTRAST:  100mL OMNIPAQUE IOHEXOL 350 MG/ML SOLN COMPARISON:  Chest CT PE protocol 12/08/2014 FINDINGS: There are no filling defects within the pulmonary arteries to suggest pulmonary embolus. Motion artifact through the lung bases limits detailed assessment. The thoracic aorta is normal in caliber without dissection. The heart size is normal. There is no pleural or pericardial effusion. No pleural or pericardial effusion. Soft tissue density anterior mediastinum consistent with residual thymus, unchanged. No consolidation, pulmonary nodule or mass. Previously described heterogeneous lung attenuation is less pronounced than on prior. Trachea and mainstem bronchi are patent. No acute abnormality in the upper abdomen. There are no acute or suspicious osseous abnormalities. Review of the MIP images confirms the above findings. IMPRESSION: 1. No pulmonary embolus. 2. Heterogeneous appearance of the lung parenchyma, however improved from prior exam, improving small airways disease. Electronically Signed   By: Rubye OaksMelanie  Ehinger M.D.   On: 01/11/2015 21:39     MAU Course  Procedures  None  MDM Protonix 80 mg given PO> no relief   1900 Discussed patient with Dr. Chestine Sporelark; patient is still rating her chest discomfort 2/10; she denies shortness of breath, however does feel like something is going on.  Pulse ox remains  99% on room air.    + fetal heart tones.   Report given to Thressa ShellerHeather Hogan CNM who resumes care of the patient @ 2000; patient awaiting CT   Duane LopeJennifer I Rasch, NP  2156: Patient reports that pain is the same 2/10 at this time.  2200: D/W Dr. Chestine Sporelark, ok for dc home with PPI rx. FU with the office as planned. Reinforce lovenox use.  Assessment and Plan   1. Gastroesophageal reflux disease without esophagitis   2. Chest pain    DC home Comfort measures reviewed  2nd Trimester precautions  Reinforced lovenox use. RX: prilosec BID #[id number]  Return to MAU as needed FU with OB as planned  Follow-up Information    [facility] LeydenGEFFEL CLARK, MD.   Specialty:  Obstetrics   Why:  As scheduled   Contact information:   [address] [place] [address] [phone]

## 2015-01-11 NOTE — MAU Provider Note (Signed)
History     CSN: [id number]  Arrival date and time: 01/11/15 1150   First Provider Initiated Contact with Patient 01/11/15 1316      No chief complaint on file.  HPI   [name] is a 24 y.o. female G2P1001 at 6454w4d presenting to MAU with acid reflux symptoms. The patient was sent here for a GI cocktail from the office.   The symptoms started 1 week ago; she described to her Dr today that she feels like something is sitting on her chest.  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 The pain does not worsen with movement, and the patient denies a diet high in acid.  She has never had this pain before.   OB History    Gravida Para Term Preterm AB TAB SAB Ectopic Multiple Living   2 1 1       1       Past Medical History  Diagnosis Date  . Complication of anesthesia   . Family history of adverse reaction to anesthesia     mother has n/v post op  . GERD (gastroesophageal reflux disease)     no current meds for reflux  . Kidney stone   . Arm vein blood clot     Past Surgical History  Procedure Laterality Date  . Appendectomy  04/01/2004    ex. lap.  . Knee surgery    . Cystoscopy with retrograde pyelogram, ureteroscopy and stent placement Left 05/05/2014    Procedure: LEFT  URETEROSCOPY,  RETROGRADE PYELOGRAM,  AND STENT PLACEMENT, STONE REMOVAL;  Surgeon: Crist FatBenjamin W Herrick, MD;  Location: [facility];  Service: Urology;  Laterality: Left;  . Holmium laser application Left 05/05/2014    Procedure: HOLMIUM LASER APPLICATION;  Surgeon: Crist FatBenjamin W Herrick, MD;  Location: [facility];  Service: Urology;  Laterality: Left;    Family History  Problem Relation Age of Onset  . Anesthesia problems Mother     post-op N/V, hard to wake up  . Arthritis Mother     Social History  Substance Use Topics  . Smoking status: Former Smoker    Types: Cigars    Quit date: 09/24/2014  . Smokeless tobacco: Never  Used  . Alcohol Use: Yes     Comment: occasional/ not since pregnancy    Allergies: No Known Allergies  Prescriptions prior to admission  Medication Sig Dispense Refill Last Dose  . doxylamine, Sleep, (UNISOM) 25 MG tablet Take 25 mg by mouth at bedtime as needed.   12/07/2014 at Unknown time  . enoxaparin (LOVENOX) 60 MG/0.6ML injection Inject 0.6 mLs into the skin every 12 (twelve) hours.  12 12/07/2014 at 2100  . folic acid (FOLVITE) 400 MCG tablet Take 400 mcg by mouth at bedtime.   12/06/2014 at Unknown time  . Prenatal Vit-Fe Fumarate-FA (PRENATAL MULTIVITAMIN) TABS tablet Take 1 tablet by mouth daily.   12/06/2014 at Unknown time   No results found for this or any previous visit (from the past 48 hour(s)).  Review of Systems  Constitutional: Negative for fever, chills and diaphoresis.  HENT: Negative for hearing loss.   Eyes: Negative for blurred vision and double vision.  Respiratory: Negative for cough and shortness of breath.   Cardiovascular: Negative for chest pain, palpitations and orthopnea.  Gastrointestinal: Positive for heartburn and nausea.       + burping.   Neurological: Negative for dizziness, weakness  and headaches.   Physical Exam   Blood pressure 131/74, pulse 106, temperature 98.1 F (36.7 C), temperature source Oral, resp. rate 20, last menstrual period 09/17/2014, SpO2 100 %.  Physical Exam  Constitutional: She is oriented to person, place, and time. She appears well-developed and well-nourished.  Non-toxic appearance. She does not have a sickly appearance. She does not appear ill. No distress.  HENT:  Head: Normocephalic.  Eyes: Pupils are equal, round, and reactive to light.  Neck: Neck supple.  Cardiovascular: Normal rate and normal heart sounds.   Respiratory: Effort normal. No respiratory distress. She has no wheezes. She has no rales. She exhibits no tenderness.  Musculoskeletal: Normal range of motion.  Neurological: She is alert and oriented to  person, place, and time.  Skin: Skin is warm. She is not diaphoretic.  Psychiatric: Her behavior is normal.    MAU Course  Procedures  None  MDM  + fetal heart tones  GI cocktail given.  Patient rates her pain 2/10 upon arrival  Patient rates her pain 2/10    Assessment and Plan    Patient left AMA and will return later for possible Spiral CT scan.    Duane Lope, NP 01/11/2015 1:22 PM

## 2015-01-11 NOTE — MAU Note (Signed)
Sent here by Dr. Tenny Crawoss for GI Cocktail.  Pt had DVT in left upper arm with this pregnancy, has been on Lovenox for almost 2 months. Pt c/o chest pain that she suspects is indigestion.  Wants to evaluate efficacy of GI Cocktail.

## 2015-01-11 NOTE — Discharge Instructions (Signed)
How and Where to Give Subcutaneous Enoxaparin Injections  Enoxaparin is an injectable medicine. It is used to help prevent blood clots from developing in your veins. Health care providers often use anticoagulants like enoxaparin to prevent clots following surgery. Enoxaparin is also used in combination with other medicines to treat blood clots and heart attacks. If blood clots are left untreated, they can be life threatening.   Enoxaparin comes in single-use syringes. You inject enoxaparin through a syringe into your belly (abdomen). You should change the injection site each time you give yourself a shot. Continue the enoxaparin injections as directed by your health care provider. Your health care provider will use blood clotting test results to decide when you can safely stop using enoxaparin injections. If your health care provider prescribes any additional medicines, use the medicines exactly as directed.  HOW DO I INJECT ENOXAPARIN?   1. Wash your hands with soap and water.  2. Clean the selected injection site as directed by your health care provider.  3. Remove the needle cap by pulling it straight off the syringe.  4. Hold the syringe like a pencil using your writing hand.  5. Use your other hand to pinch and hold an inch of the cleansed skin.  6. Insert the entire needle straight down into the fold of skin.  7. Push the plunger with your thumb until the syringe is empty.  8. Pull the needle straight out of your skin.  9. Enoxaparin injection prefilled syringes and graduated prefilled syringes are available with a system that shields the needle after injection. After you have completed your injection and removed the needle from your skin, firmly push down on the plunger. The protective sleeve will automatically cover the needle and you will hear a click. The click means the needle is safely covered.  10. Place the syringe in the nearest needle box, also called a sharps container. If you do not have a sharps  container, you can use a hard-sided plastic container with a secure lid, such as an empty laundry detergent bottle.  WHAT ELSE DO I NEED TO KNOW?   Do not use enoxaparin if:    You have allergies to heparin or pork products.    You have been diagnosed with a condition called thrombocytopenia.   Do not use the syringe or needle more than one time.   Use medicines only as directed by your health care provider.   Changes in medicines, supplements, diet, and illness can affect your anticoagulation therapy. Be sure to inform your health care provider of any of these changes.   It is important that you tell all of your health care providers and your dentist that you are taking an anticoagulant, especially if you are injured or plan to have any type of procedure.   While on anticoagulants, you will need to have blood tests done routinely as directed by your health care provider.   While using this medicine, avoid physical activities or sports that could result in a fall or cause injury.   Follow up with your laboratory test and health care provider appointments as directed. It is very important to keep your appointments. Not keeping appointments could result in a chronic or permanent injury, pain, or disability.   Before giving your medicine, you should make sure the injection is a clear and colorless or pale yellow solution. If your medicine becomes discolored or if there are particles in the syringe, do not use it and notify your health   care provider.   Keep your medicine safely stored at room temperature.  SEEK MEDICAL CARE IF:   You develop any rashes on your skin.   You have large areas of bruising on your skin.   You have any worsening of the condition for which you take Enoxaparin.   You develop a fever.  SEEK IMMEDIATE MEDICAL CARE IF:   You develop bleeding problems such as:    Bleeding from the gums or nose that does not stop quickly.    Vomiting blood or coughing up blood.    Blood in your  urine.    Blood in your stool, or stool that has a dark, tarry, or coffee grounds appearance.    A cut that does not stop bleeding within 10 minutes.  These symptoms may represent a serious problem that is an emergency. Do not wait to see if the symptoms will go away. Get medical help right away. Call your local emergency services (911 in the [place] Do not drive yourself to the hospital.      This information is not intended to replace advice given to you by your health care provider. Make sure you discuss any questions you have with your health care provider.     Document Released: 01/10/2004 Document Revised: 03/31/2014 Document Reviewed: 08/25/2013  Elsevier Interactive Patient Education 2016 [employer]

## 2015-01-21 ENCOUNTER — Encounter (HOSPITAL_COMMUNITY): Payer: Self-pay | Admitting: *Deleted

## 2015-01-21 ENCOUNTER — Inpatient Hospital Stay (HOSPITAL_COMMUNITY)
Admission: AD | Admit: 2015-01-21 | Discharge: 2015-01-21 | Disposition: A | Discharge status: Home / Self Care | Payer: BLUE CROSS/BLUE SHIELD | Source: Ambulatory Visit | Attending: Obstetrics and Gynecology | Admitting: Obstetrics and Gynecology

## 2015-01-21 DIAGNOSIS — O98812 Other maternal infectious and parasitic diseases complicating pregnancy, second trimester: Secondary | ICD-10-CM | POA: Diagnosis not present

## 2015-01-21 DIAGNOSIS — Z87891 Personal history of nicotine dependence: Secondary | ICD-10-CM | POA: Diagnosis not present

## 2015-01-21 DIAGNOSIS — O26892 Other specified pregnancy related conditions, second trimester: Secondary | ICD-10-CM | POA: Insufficient documentation

## 2015-01-21 DIAGNOSIS — Z3A18 18 weeks gestation of pregnancy: Secondary | ICD-10-CM | POA: Insufficient documentation

## 2015-01-21 DIAGNOSIS — R1011 Right upper quadrant pain: Secondary | ICD-10-CM

## 2015-01-21 DIAGNOSIS — B373 Candidiasis of vulva and vagina: Secondary | ICD-10-CM

## 2015-01-21 DIAGNOSIS — R1010 Upper abdominal pain, unspecified: Secondary | ICD-10-CM | POA: Diagnosis not present

## 2015-01-21 DIAGNOSIS — B3731 Acute candidiasis of vulva and vagina: Secondary | ICD-10-CM

## 2015-01-21 DIAGNOSIS — R1012 Left upper quadrant pain: Secondary | ICD-10-CM

## 2015-01-21 DIAGNOSIS — R109 Unspecified abdominal pain: Secondary | ICD-10-CM | POA: Diagnosis present

## 2015-01-21 LAB — WET PREP, GENITAL
CLUE CELLS WET PREP: NONE SEEN
Trich, Wet Prep: NONE SEEN

## 2015-01-21 LAB — URINALYSIS, ROUTINE W REFLEX MICROSCOPIC
Bilirubin Urine: NEGATIVE
GLUCOSE, UA: NEGATIVE mg/dL
HGB URINE DIPSTICK: NEGATIVE
KETONES UR: 40 mg/dL — AB
Nitrite: NEGATIVE
PROTEIN: NEGATIVE mg/dL
Specific Gravity, Urine: 1.025 (ref 1.005–1.030)
UROBILINOGEN UA: 0.2 mg/dL (ref 0.0–1.0)
pH: 6 (ref 5.0–8.0)

## 2015-01-21 LAB — URINE MICROSCOPIC-ADD ON

## 2015-01-21 MED ORDER — SIMETHICONE 80 MG PO CHEW
160.0000 mg | CHEWABLE_TABLET | Freq: Once | ORAL | Status: AC
  Administered 2015-01-21: 160 mg via ORAL
  Filled 2015-01-21: qty 2

## 2015-01-21 MED ORDER — ACETAMINOPHEN 325 MG PO TABS
650.0000 mg | ORAL_TABLET | Freq: Once | ORAL | Status: AC
  Administered 2015-01-21: 650 mg via ORAL
  Filled 2015-01-21: qty 2

## 2015-01-21 MED ORDER — TERCONAZOLE 0.4 % VA CREA: 1.0000 | TOPICAL_CREAM | Freq: Every day | VAGINAL | Status: DC

## 2015-01-21 NOTE — MAU Provider Note (Signed)
History     CSN: [id number]  Arrival date and time: 01/21/15 1147   First Provider Initiated Contact with Patient 01/21/15 1253      Chief Complaint  Patient presents with  . Abdominal Pain   HPI Erin Jacobson  24 y.o.  3445w0d  Comes to MAU with periodic abdominal pain.  Had some pain last night before bed.  Then awakened this morning with this abdominal pain.  Ate some cereal and the pain did not decrease.  In the last month, client has started Prilosec for heartburn.  No heartburn today.  BM yesterday and today.  No diarrhea.  Did not use any Tylenol at home.  Denies any leaking of fluid or vaginal bleeding.   OB History    Gravida Para Term Preterm AB TAB SAB Ectopic Multiple Living   2 1 1       1       Past Medical History  Diagnosis Date  . Complication of anesthesia   . Family history of adverse reaction to anesthesia     mother has n/v post op  . GERD (gastroesophageal reflux disease)     no current meds for reflux  . Kidney stone   . Arm vein blood clot     Past Surgical History  Procedure Laterality Date  . Appendectomy  04/01/2004    ex. lap.  . Knee surgery    . Cystoscopy with retrograde pyelogram, ureteroscopy and stent placement Left 05/05/2014    Procedure: LEFT  URETEROSCOPY,  RETROGRADE PYELOGRAM,  AND STENT PLACEMENT, STONE REMOVAL;  Surgeon: Crist FatBenjamin W Herrick, MD;  Location: [facility];  Service: Urology;  Laterality: Left;  . Holmium laser application Left 05/05/2014    Procedure: HOLMIUM LASER APPLICATION;  Surgeon: Crist FatBenjamin W Herrick, MD;  Location: [facility];  Service: Urology;  Laterality: Left;    Family History  Problem Relation Age of Onset  . Anesthesia problems Mother     post-op N/V, hard to wake up  . Arthritis Mother     Social History  Substance Use Topics  . Smoking status: Former Smoker    Types: Cigars    Quit date: 09/24/2014  . Smokeless tobacco: Never Used  . Alcohol Use: Yes     Comment: occasional/ not since pregnancy     Allergies: No Known Allergies  Prescriptions prior to admission  Medication Sig Dispense Refill Last Dose  . diphenhydrAMINE (BENADRYL) 25 mg capsule Take 25 mg by mouth every 6 (six) hours as needed.   01/20/2015 at Unknown time  . enoxaparin (LOVENOX) 60 MG/0.6ML injection Inject 0.6 mLs into the skin every 12 (twelve) hours.  12 Past Month at Unknown time  . folic acid (FOLVITE) 400 MCG tablet Take 400 mcg by mouth at bedtime.   01/21/2015 at Unknown time  . omeprazole (PRILOSEC) 20 MG capsule Take 1 capsule (20 mg total) by mouth 2 (two) times daily before a meal. 60 capsule 0 01/21/2015 at Unknown time  . Prenatal Vit-Fe Fumarate-FA (PRENATAL MULTIVITAMIN) TABS tablet Take 1 tablet by mouth daily.   01/21/2015 at Unknown time  . promethazine (PHENERGAN) 25 MG tablet Take 25 mg by mouth every 4 (four) hours as needed for nausea or vomiting.   Past Week at Unknown time    Review of Systems  Constitutional: Negative for fever.  Gastrointestinal: Positive for abdominal pain. Negative for nausea, vomiting, diarrhea and constipation.  Genitourinary:       No vaginal discharge. No vaginal bleeding. No dysuria.  Neurological: Negative for headaches.   Physical Exam   Blood pressure 107/61, pulse 114, temperature 98.5 F (36.9 C), temperature source Oral, resp. rate 18, height  (1.549 m), weight 140 lb (63.504 kg), last menstrual period 09/17/2014, SpO2 100 %.  Physical Exam  Nursing note and vitals reviewed. Constitutional: She is oriented to person, place, and time. She appears well-developed and well-nourished.  HENT:  Head: Normocephalic.  Eyes: EOM are normal.  Neck: Neck supple.  GI: Soft. There is tenderness. There is no rebound and no guarding.  Pain is upper abdomen and then radiates to lower abdomen causing pressure. FHT heard by doppler  Genitourinary:  Speculum exam: Vagina - Mod amount of cloudy liquid and yellow adherent discharge noted, no odor Cervix  -Appears closed, No contact bleeding Bimanual exam: Cervix closed and thick Uterus gravid, 18 week size, nontender Adnexa non tender, no masses bilaterally GC/Chlam, wet prep done Chaperone present for exam.  Musculoskeletal: Normal range of motion.  Neurological: She is alert and oriented to person, place, and time.  Skin: Skin is warm and dry.  Psychiatric: She has a normal mood and affect.    MAU Course  Procedures Results for orders placed or performed during the hospital encounter of 01/21/15 (from the past 24 hour(s))  Urinalysis, Routine w reflex microscopic (not at [facility])     Status: Abnormal   Collection Time: 01/21/15 12:00 PM  Result Value Ref Range   Color, Urine YELLOW YELLOW   APPearance CLEAR CLEAR   Specific Gravity, Urine 1.025 1.005 - 1.030   pH 6.0 5.0 - 8.0   Glucose, UA NEGATIVE NEGATIVE mg/dL   Hgb urine dipstick NEGATIVE NEGATIVE   Bilirubin Urine NEGATIVE NEGATIVE   Ketones, ur 40 (A) NEGATIVE mg/dL   Protein, ur NEGATIVE NEGATIVE mg/dL   Urobilinogen, UA 0.2 0.0 - 1.0 mg/dL   Nitrite NEGATIVE NEGATIVE   Leukocytes, UA TRACE (A) NEGATIVE  Urine microscopic-add on     Status: Abnormal   Collection Time: 01/21/15 12:00 PM  Result Value Ref Range   Squamous Epithelial / LPF RARE RARE   WBC, UA 3-6 <3 WBC/hpf   Bacteria, UA FEW (A) RARE   Urine-Other MUCOUS PRESENT   Wet prep, genital     Status: Abnormal   Collection Time: 01/21/15  1:12 PM  Result Value Ref Range   Yeast Wet Prep HPF POC RARE (A) NONE SEEN   Trich, Wet Prep NONE SEEN NONE SEEN   Clue Cells Wet Prep HPF POC NONE SEEN NONE SEEN   WBC, Wet Prep HPF POC MANY (A) NONE SEEN    MDM   Assessment and Plan  Upper abdominal pain at [redacted] weeks gestation, possibly related to gas pains Vaginal yeast infection  Plan Continue medications as you have been prescribed. Can take chewable Mylicon  - OTC medication - to see if it helps the pain. Return if the pain is worsening or if you develop  nausea or vomiting, or fever. Get your cream for the yeast infection at your pharmacy. Urine culture pending.  Jerelle Virden 01/21/2015, 2:03 PM

## 2015-01-21 NOTE — MAU Note (Signed)
C/O aching cramping abdominal pain that started yesterday, but was not too severe. Today is a little worse and constant. No bleeding. C/O nausea, no vomiting or diarrhea.

## 2015-01-21 NOTE — Discharge Instructions (Signed)
Your cervix is closed and thick.  This pain is not related to labor.  It may be gas pains and if so, we expect it to get better. Continue medications as you have been prescribed. Can take chewable Mylicon  - OTC medication - to see if it helps the pain. Return if the pain is worsening or if you develop nausea or vomiting, or fever. Get your cream for the yeast infection at your pharmacy.

## 2015-01-22 LAB — CULTURE, OB URINE
CULTURE: NO GROWTH
SPECIAL REQUESTS: NORMAL

## 2015-01-22 LAB — GC/CHLAMYDIA PROBE AMP (~~LOC~~) NOT AT ARMC
Chlamydia: NEGATIVE
Neisseria Gonorrhea: NEGATIVE

## 2015-03-11 ENCOUNTER — Emergency Department (EMERGENCY_DEPARTMENT_HOSPITAL)
Admission: RE | Admit: 2015-03-11 | Discharge: 2015-03-11 | Disposition: A | Discharge status: Home / Self Care | Payer: Medicaid Other | Source: Ambulatory Visit

## 2015-03-11 ENCOUNTER — Emergency Department (HOSPITAL_COMMUNITY)
Admission: EM | Admit: 2015-03-11 | Discharge: 2015-03-11 | Disposition: A | Discharge status: Home / Self Care | Payer: Medicaid Other | Attending: Emergency Medicine | Admitting: Emergency Medicine

## 2015-03-11 ENCOUNTER — Encounter (HOSPITAL_COMMUNITY): Payer: Self-pay | Admitting: Emergency Medicine

## 2015-03-11 ENCOUNTER — Other Ambulatory Visit: Payer: Self-pay

## 2015-03-11 DIAGNOSIS — M79622 Pain in left upper arm: Secondary | ICD-10-CM | POA: Insufficient documentation

## 2015-03-11 DIAGNOSIS — M79609 Pain in unspecified limb: Secondary | ICD-10-CM | POA: Diagnosis not present

## 2015-03-11 DIAGNOSIS — Z87442 Personal history of urinary calculi: Secondary | ICD-10-CM | POA: Diagnosis not present

## 2015-03-11 DIAGNOSIS — Z3A25 25 weeks gestation of pregnancy: Secondary | ICD-10-CM | POA: Insufficient documentation

## 2015-03-11 DIAGNOSIS — R0789 Other chest pain: Secondary | ICD-10-CM | POA: Diagnosis not present

## 2015-03-11 DIAGNOSIS — Z86718 Personal history of other venous thrombosis and embolism: Secondary | ICD-10-CM | POA: Diagnosis not present

## 2015-03-11 DIAGNOSIS — M79602 Pain in left arm: Secondary | ICD-10-CM

## 2015-03-11 DIAGNOSIS — Z8719 Personal history of other diseases of the digestive system: Secondary | ICD-10-CM | POA: Insufficient documentation

## 2015-03-11 DIAGNOSIS — Z87891 Personal history of nicotine dependence: Secondary | ICD-10-CM | POA: Insufficient documentation

## 2015-03-11 DIAGNOSIS — Z7901 Long term (current) use of anticoagulants: Secondary | ICD-10-CM | POA: Insufficient documentation

## 2015-03-11 DIAGNOSIS — M7989 Other specified soft tissue disorders: Secondary | ICD-10-CM | POA: Diagnosis not present

## 2015-03-11 DIAGNOSIS — O9989 Other specified diseases and conditions complicating pregnancy, childbirth and the puerperium: Secondary | ICD-10-CM | POA: Insufficient documentation

## 2015-03-11 DIAGNOSIS — Z79899 Other long term (current) drug therapy: Secondary | ICD-10-CM | POA: Insufficient documentation

## 2015-03-11 LAB — CBC
HCT: 31.4 % — ABNORMAL LOW (ref 36.0–46.0)
HEMOGLOBIN: 10.3 g/dL — AB (ref 12.0–15.0)
MCH: 27.5 pg (ref 26.0–34.0)
MCHC: 32.8 g/dL (ref 30.0–36.0)
MCV: 83.7 fL (ref 78.0–100.0)
Platelets: 322 10*3/uL (ref 150–400)
RBC: 3.75 MIL/uL — AB (ref 3.87–5.11)
RDW: 13.2 % (ref 11.5–15.5)
WBC: 8.1 10*3/uL (ref 4.0–10.5)

## 2015-03-11 LAB — BASIC METABOLIC PANEL
ANION GAP: 7 (ref 5–15)
BUN: <5 mg/dL — ABNORMAL LOW (ref 6–20)
CALCIUM: 9 mg/dL (ref 8.9–10.3)
CHLORIDE: 108 mmol/L (ref 101–111)
CO2: 21 mmol/L — AB (ref 22–32)
Creatinine, Ser: 0.62 mg/dL (ref 0.44–1.00)
GFR calc non Af Amer: >60 mL/min (ref >60)
Glucose, Bld: 91 mg/dL (ref 65–99)
Potassium: 3.5 mmol/L (ref 3.5–5.1)
Sodium: 136 mmol/L (ref 135–145)

## 2015-03-11 LAB — I-STAT TROPONIN, ED: TROPONIN I, POC: 0 ng/mL (ref 0.00–0.08)

## 2015-03-11 MED ORDER — ENOXAPARIN SODIUM 80 MG/0.8ML ~~LOC~~ SOLN
1.0000 mg/kg | Freq: Once | SUBCUTANEOUS | Status: AC
  Administered 2015-03-11: 65 mg via SUBCUTANEOUS
  Filled 2015-03-11: qty 0.8

## 2015-03-11 NOTE — ED Notes (Signed)
Spoke with [facility] RN and Dr Patria Maneampos. [facility] not needed at this time.

## 2015-03-11 NOTE — ED Provider Notes (Addendum)
CSN: [id number]     Arrival date & time 03/11/15  1006 History   First MD Initiated Contact with Patient 03/11/15 1037     Chief Complaint  Patient presents with  . Chest Pain  . Arm Pain      HPI Patient presents to the emergency department complaining of left arm discomfort and pain with mild associated swelling around the left bicep region.  She states this is present for about 24 hours.  She has a history of DVT.  She is currently pregnant.  She is post be taking twice a day Lovenox injections for which she has not been taking over the past month secondary to some anxiety associated with giving herself the injection.  She has successfully injected Lovenox herself without difficulty but she states that she stopped.  She's not concerned about the possibility of a DVT in the left upper extremity given her pain and swelling.  No injury or trauma.  She reports transient chest pain with some radiation to her back.  She has no active chest pain or shortness of breath at this time.  No exertional shortness breath.  No fevers or chills per no productive cough.  No other complaints.  Pain in her left arm is mild in severity and worse with movement and palpation of her left distal bicep region   Past Medical History  Diagnosis Date  . Complication of anesthesia   . Family history of adverse reaction to anesthesia     mother has n/v post op  . GERD (gastroesophageal reflux disease)     no current meds for reflux  . Kidney stone   . Arm vein blood clot    Past Surgical History  Procedure Laterality Date  . Appendectomy  04/01/2004    ex. lap.  . Knee surgery    . Cystoscopy with retrograde pyelogram, ureteroscopy and stent placement Left 05/05/2014    Procedure: LEFT  URETEROSCOPY,  RETROGRADE PYELOGRAM,  AND STENT PLACEMENT, STONE REMOVAL;  Surgeon: Crist Fat, MD;  Location: [facility];  Service: Urology;  Laterality: Left;  . Holmium laser application Left 05/05/2014    Procedure: HOLMIUM  LASER APPLICATION;  Surgeon: Crist Fat, MD;  Location: [facility];  Service: Urology;  Laterality: Left;   Family History  Problem Relation Age of Onset  . Anesthesia problems Mother     post-op N/V, hard to wake up  . Arthritis Mother    Social History  Substance Use Topics  . Smoking status: Former Smoker    Types: Cigars    Quit date: 09/24/2014  . Smokeless tobacco: Never Used  . Alcohol Use: Yes     Comment: occasional/ not since pregnancy   OB History    Gravida Para Term Preterm AB TAB SAB Ectopic Multiple Living   Review of Systems  All other systems reviewed and are negative.     Allergies  Review of patient's allergies indicates no known allergies.  Home Medications   Prior to Admission medications   Medication Sig Start Date End Date Taking? Authorizing Provider  diphenhydrAMINE (BENADRYL) 25 mg capsule Take 25 mg by mouth every 6 (six) hours as needed for allergies.    Yes Historical Provider, MD  enoxaparin (LOVENOX) 60 MG/0.6ML injection Inject 0.6 mLs into the skin every 12 (twelve) hours. 12/01/14  Yes Historical Provider, MD  folic acid (FOLVITE) 400 MCG tablet Take 400 mcg  by mouth at bedtime.   Yes Historical Provider, MD  Prenatal Vit-Fe Fumarate-FA (PRENATAL MULTIVITAMIN) TABS tablet Take 1 tablet by mouth daily.   Yes Historical Provider, MD  omeprazole (PRILOSEC) 20 MG capsule Take 1 capsule (20 mg total) by mouth 2 (two) times daily before a meal. Patient not taking: Reported on 03/11/2015 01/11/15   Armando ReichertHeather D Hogan, CNM  terconazole (TERAZOL 7) 0.4 % vaginal cream Place 1 applicator vaginally at bedtime. Use for 7 days. Patient not taking: Reported on 03/11/2015 01/21/15   Currie Pariserri L Burleson, NP   BP 115/75 mmHg  Pulse 101  Temp(Src) 99 F (37.2 C) (Oral)  Resp 18  Ht 5\' 1"  (1.549 m)  Wt 144 lb (65.318 kg)  BMI 27.22 kg/m2  SpO2 100%  LMP 09/17/2014 Physical Exam  Constitutional: She is oriented to person, place, and  time. She appears well-developed and well-nourished.  HENT:  Head: Normocephalic.  Eyes: EOM are normal.  Neck: Normal range of motion.  Pulmonary/Chest: Effort normal.  Abdominal: She exhibits no distension.  Gravid uterus consistent with dates  Musculoskeletal: Normal range of motion.  Mild tenderness of the distal aspect of her anterior left bicep region without erythema or fluctuance.  Normal range of motion of the left shoulder and left elbow.  Normal left radial pulse.  Normal grip strength of her left hand.  Neurological: She is alert and oriented to person, place, and time.  Psychiatric: She has a normal mood and affect.  Nursing note and vitals reviewed.   ED Course  Procedures (including critical care time) Labs Review Labs Reviewed  BASIC METABOLIC PANEL - Abnormal; Notable for the following:    CO2 21 (*)    BUN <5 (*)    All other components within normal limits  CBC - Abnormal; Notable for the following:    RBC 3.75 (*)    Hemoglobin 10.3 (*)    HCT 31.4 (*)    All other components within normal limits  I-STAT TROPOININ, ED   HEMOGLOBIN  Date Value Ref Range Status  03/11/2015 10.3* 12.0 - 15.0 g/dL Final  16/10/960409/16/2016 54.011.5* 12.0 - 15.0 g/dL Final  98/11/914709/10/2014 82.912.0 12.0 - 15.0 g/dL Final  56/21/308608/29/2016 57.810.8* 12.0 - 15.0 g/dL Final     Imaging Review No results found. I have personally reviewed and evaluated these images and lab results as part of my medical decision-making.   EKG Interpretation   Date/Time:  Sunday March 11 2015 10:06:16 EST Ventricular Rate:  95 PR Interval:  164 QRS Duration: 78 QT Interval:  358 QTC Calculation: 449 R Axis:   97 Text Interpretation:  Normal sinus rhythm Rightward axis Borderline ECG No  significant change was found Confirmed by Makia Bossi  MD, Arlys Scatena ([id number]) on  03/11/2015 1:15:34 PM      MDM   Final diagnoses:  Left arm pain    Ultrasound of her left upper extremity is negative for acute DVT.  She was given a  single dose of 1 mg/kg Lovenox while in the emergency department.  I have strongly encouraged her to take her twice a day Lovenox as instructed.  She will take Tylenol for the pain.  This may represent muscle strain or spasm.  Chest pain is nonspecific.  Discharge home in good condition.  Doubt PE.  Doubt ACS.  Patient tells me she will be compliant with her Lovenox at she's been instructed.    Azalia BilisKevin Irma Roulhac, MD 03/11/15 1314  Azalia BilisKevin Laiylah Roettger, MD 03/11/15 [phone]

## 2015-03-11 NOTE — ED Notes (Signed)
Attempt to contact [facility] RN unsuccessful.

## 2015-03-11 NOTE — Discharge Instructions (Signed)
Please take your Lovenox injections as instructed.

## 2015-03-11 NOTE — ED Notes (Signed)
Patient transported to Ultrasound 

## 2015-03-11 NOTE — Progress Notes (Signed)
VASCULAR LAB PRELIMINARY  PRELIMINARY  PRELIMINARY  PRELIMINARY  Left upper extremity venous duplex completed.    Preliminary report:  There is no DVT or SVT noted in the left upper extremity.   Miral Hoopes, RVT 03/11/2015, 12:53 PM

## 2015-03-11 NOTE — ED Notes (Signed)
Pt c/o center chest pain that radiates to back and left arm pain onset yesterday. Pt is currently [redacted] weeks pregnant.

## 2015-05-22 ENCOUNTER — Other Ambulatory Visit: Payer: Self-pay | Admitting: Obstetrics and Gynecology

## 2015-05-22 LAB — OB RESULTS CONSOLE GBS: GBS: NEGATIVE

## 2015-06-11 ENCOUNTER — Inpatient Hospital Stay (HOSPITAL_COMMUNITY)
Admission: AD | Admit: 2015-06-11 | Discharge: 2015-06-14 | DRG: 766 | Disposition: A | Discharge status: Home / Self Care | Payer: BLUE CROSS/BLUE SHIELD | Source: Ambulatory Visit | Attending: Obstetrics | Admitting: Obstetrics

## 2015-06-11 ENCOUNTER — Inpatient Hospital Stay (HOSPITAL_COMMUNITY)
Admission: AD | Admit: 2015-06-11 | Discharge: 2015-06-11 | Disposition: A | Discharge status: Home / Self Care | Payer: BLUE CROSS/BLUE SHIELD | Source: Ambulatory Visit | Attending: Obstetrics and Gynecology | Admitting: Obstetrics and Gynecology

## 2015-06-11 ENCOUNTER — Encounter (HOSPITAL_COMMUNITY): Payer: Self-pay

## 2015-06-11 ENCOUNTER — Inpatient Hospital Stay (HOSPITAL_COMMUNITY): Payer: BLUE CROSS/BLUE SHIELD | Admitting: Anesthesiology

## 2015-06-11 ENCOUNTER — Inpatient Hospital Stay (HOSPITAL_COMMUNITY): Payer: BLUE CROSS/BLUE SHIELD

## 2015-06-11 ENCOUNTER — Encounter (HOSPITAL_COMMUNITY): Payer: Self-pay | Admitting: *Deleted

## 2015-06-11 ENCOUNTER — Encounter (HOSPITAL_COMMUNITY)
Admission: AD | Disposition: A | Discharge status: Home / Self Care | Payer: Self-pay | Source: Ambulatory Visit | Attending: Obstetrics

## 2015-06-11 DIAGNOSIS — O324XX0 Maternal care for high head at term, not applicable or unspecified: Principal | ICD-10-CM | POA: Diagnosis present

## 2015-06-11 DIAGNOSIS — K219 Gastro-esophageal reflux disease without esophagitis: Secondary | ICD-10-CM | POA: Diagnosis present

## 2015-06-11 DIAGNOSIS — O4200 Premature rupture of membranes, onset of labor within 24 hours of rupture, unspecified weeks of gestation: Secondary | ICD-10-CM | POA: Diagnosis present

## 2015-06-11 DIAGNOSIS — Z3A38 38 weeks gestation of pregnancy: Secondary | ICD-10-CM

## 2015-06-11 DIAGNOSIS — O9962 Diseases of the digestive system complicating childbirth: Secondary | ICD-10-CM | POA: Diagnosis present

## 2015-06-11 DIAGNOSIS — Z86718 Personal history of other venous thrombosis and embolism: Secondary | ICD-10-CM

## 2015-06-11 DIAGNOSIS — Z87891 Personal history of nicotine dependence: Secondary | ICD-10-CM

## 2015-06-11 DIAGNOSIS — Z87442 Personal history of urinary calculi: Secondary | ICD-10-CM

## 2015-06-11 DIAGNOSIS — R339 Retention of urine, unspecified: Secondary | ICD-10-CM | POA: Diagnosis not present

## 2015-06-11 DIAGNOSIS — O288 Other abnormal findings on antenatal screening of mother: Secondary | ICD-10-CM

## 2015-06-11 HISTORY — DX: Acute embolism and thrombosis of deep veins of unspecified upper extremity: I82.629

## 2015-06-11 LAB — CBC
HCT: 32.5 % — ABNORMAL LOW (ref 36.0–46.0)
Hemoglobin: 10.3 g/dL — ABNORMAL LOW (ref 12.0–15.0)
MCH: 23.7 pg — AB (ref 26.0–34.0)
MCHC: 31.7 g/dL (ref 30.0–36.0)
MCV: 74.7 fL — AB (ref 78.0–100.0)
PLATELETS: 391 10*3/uL (ref 150–400)
RBC: 4.35 MIL/uL (ref 3.87–5.11)
RDW: 15.6 % — AB (ref 11.5–15.5)
WBC: 12.2 10*3/uL — AB (ref 4.0–10.5)

## 2015-06-11 LAB — TYPE AND SCREEN
ABO/RH(D): B POS
Antibody Screen: NEGATIVE

## 2015-06-11 SURGERY — Surgical Case
Anesthesia: Epidural

## 2015-06-11 MED ORDER — LACTATED RINGERS IV SOLN: 500.0000 mL | Freq: Once | INTRAVENOUS | Status: DC

## 2015-06-11 MED ORDER — LACTATED RINGERS IV SOLN: 500.0000 mL | INTRAVENOUS | Status: DC | PRN

## 2015-06-11 MED ORDER — MORPHINE SULFATE (PF) 0.5 MG/ML IJ SOLN
INTRAMUSCULAR | Status: AC
  Filled 2015-06-11: qty 10

## 2015-06-11 MED ORDER — PHENYLEPHRINE 40 MCG/ML (10ML) SYRINGE FOR IV PUSH (FOR BLOOD PRESSURE SUPPORT)
PREFILLED_SYRINGE | INTRAVENOUS | Status: AC
  Filled 2015-06-11: qty 10

## 2015-06-11 MED ORDER — FENTANYL 2.5 MCG/ML BUPIVACAINE 1/10 % EPIDURAL INFUSION (WH - ANES)
14.0000 mL/h | INTRAMUSCULAR | Status: DC | PRN
  Administered 2015-06-11 (×2): 14 mL/h via EPIDURAL
  Filled 2015-06-11 (×2): qty 125

## 2015-06-11 MED ORDER — PHENYLEPHRINE 40 MCG/ML (10ML) SYRINGE FOR IV PUSH (FOR BLOOD PRESSURE SUPPORT): 80.0000 ug | PREFILLED_SYRINGE | INTRAVENOUS | Status: DC | PRN

## 2015-06-11 MED ORDER — OXYCODONE-ACETAMINOPHEN 5-325 MG PO TABS: 2.0000 | ORAL_TABLET | ORAL | Status: DC | PRN

## 2015-06-11 MED ORDER — MEPERIDINE HCL 25 MG/ML IJ SOLN: 6.2500 mg | INTRAMUSCULAR | Status: DC | PRN

## 2015-06-11 MED ORDER — FENTANYL CITRATE (PF) 100 MCG/2ML IJ SOLN
INTRAMUSCULAR | Status: AC
  Filled 2015-06-11: qty 2

## 2015-06-11 MED ORDER — EPHEDRINE 5 MG/ML INJ: 10.0000 mg | INTRAVENOUS | Status: DC | PRN

## 2015-06-11 MED ORDER — TERBUTALINE SULFATE 1 MG/ML IJ SOLN: 0.2500 mg | Freq: Once | INTRAMUSCULAR | Status: DC | PRN

## 2015-06-11 MED ORDER — LIDOCAINE HCL (PF) 1 % IJ SOLN: 30.0000 mL | INTRAMUSCULAR | Status: DC | PRN

## 2015-06-11 MED ORDER — PHENYLEPHRINE 40 MCG/ML (10ML) SYRINGE FOR IV PUSH (FOR BLOOD PRESSURE SUPPORT)
80.0000 ug | PREFILLED_SYRINGE | INTRAVENOUS | Status: DC | PRN
  Filled 2015-06-11: qty 20

## 2015-06-11 MED ORDER — SODIUM BICARBONATE 8.4 % IV SOLN
INTRAVENOUS | Status: DC | PRN
  Administered 2015-06-11 (×3): 5 mL via EPIDURAL

## 2015-06-11 MED ORDER — LACTATED RINGERS IV SOLN
INTRAVENOUS | Status: DC
  Administered 2015-06-11 (×4): via INTRAVENOUS

## 2015-06-11 MED ORDER — SODIUM CHLORIDE 0.9 % IR SOLN
Status: DC | PRN
  Administered 2015-06-11: 1000 mL

## 2015-06-11 MED ORDER — ONDANSETRON HCL 4 MG/2ML IJ SOLN
INTRAMUSCULAR | Status: AC
  Filled 2015-06-11: qty 4

## 2015-06-11 MED ORDER — OXYTOCIN 10 UNIT/ML IJ SOLN
2.5000 [IU]/h | INTRAVENOUS | Status: DC
  Filled 2015-06-11: qty 10

## 2015-06-11 MED ORDER — SCOPOLAMINE 1 MG/3DAYS TD PT72
MEDICATED_PATCH | TRANSDERMAL | Status: AC
  Filled 2015-06-11: qty 3

## 2015-06-11 MED ORDER — KETOROLAC TROMETHAMINE 30 MG/ML IJ SOLN: 30.0000 mg | Freq: Four times a day (QID) | INTRAMUSCULAR | Status: AC | PRN

## 2015-06-11 MED ORDER — FENTANYL CITRATE (PF) 100 MCG/2ML IJ SOLN
INTRAMUSCULAR | Status: DC | PRN
Start: 2015-06-11 — End: 2015-06-11
  Administered 2015-06-11 (×2): 25 ug via INTRAVENOUS

## 2015-06-11 MED ORDER — KETOROLAC TROMETHAMINE 30 MG/ML IJ SOLN
30.0000 mg | Freq: Once | INTRAMUSCULAR | Status: AC
  Administered 2015-06-12: 30 mg via INTRAVENOUS

## 2015-06-11 MED ORDER — PHENYLEPHRINE HCL 10 MG/ML IJ SOLN
INTRAMUSCULAR | Status: DC | PRN
  Administered 2015-06-11 (×2): 80 ug via INTRAVENOUS

## 2015-06-11 MED ORDER — BUTORPHANOL TARTRATE 1 MG/ML IJ SOLN: 1.0000 mg | INTRAMUSCULAR | Status: DC | PRN

## 2015-06-11 MED ORDER — LACTATED RINGERS IV SOLN
40.0000 [IU] | INTRAVENOUS | Status: DC | PRN
  Administered 2015-06-11: 40 [IU] via INTRAVENOUS

## 2015-06-11 MED ORDER — ACETAMINOPHEN 325 MG PO TABS: 650.0000 mg | ORAL_TABLET | ORAL | Status: DC | PRN

## 2015-06-11 MED ORDER — CEFAZOLIN SODIUM-DEXTROSE 2-3 GM-% IV SOLR
INTRAVENOUS | Status: DC | PRN
  Administered 2015-06-11: 2 g via INTRAVENOUS

## 2015-06-11 MED ORDER — ONDANSETRON HCL 4 MG/2ML IJ SOLN
4.0000 mg | Freq: Four times a day (QID) | INTRAMUSCULAR | Status: DC | PRN
  Administered 2015-06-11: 4 mg via INTRAVENOUS

## 2015-06-11 MED ORDER — LACTATED RINGERS IV SOLN
1.0000 m[IU]/min | INTRAVENOUS | Status: DC
  Administered 2015-06-11: 2 m[IU]/min via INTRAVENOUS

## 2015-06-11 MED ORDER — DEXAMETHASONE SODIUM PHOSPHATE 4 MG/ML IJ SOLN
INTRAMUSCULAR | Status: AC
  Filled 2015-06-11: qty 1

## 2015-06-11 MED ORDER — LIDOCAINE HCL (PF) 1 % IJ SOLN
INTRAMUSCULAR | Status: DC | PRN
  Administered 2015-06-11 (×2): 4 mL via EPIDURAL

## 2015-06-11 MED ORDER — OXYCODONE-ACETAMINOPHEN 5-325 MG PO TABS
1.0000 | ORAL_TABLET | ORAL | Status: DC | PRN
Start: 2015-06-11 — End: 2015-06-12

## 2015-06-11 MED ORDER — CEFAZOLIN SODIUM-DEXTROSE 2-3 GM-% IV SOLR: 2.0000 g | INTRAVENOUS | Status: DC

## 2015-06-11 MED ORDER — DEXAMETHASONE SODIUM PHOSPHATE 4 MG/ML IJ SOLN
INTRAMUSCULAR | Status: DC | PRN
  Administered 2015-06-11: 4 mg via INTRAVENOUS

## 2015-06-11 MED ORDER — SCOPOLAMINE 1 MG/3DAYS TD PT72
MEDICATED_PATCH | TRANSDERMAL | Status: DC | PRN
  Administered 2015-06-11: 1 via TRANSDERMAL

## 2015-06-11 MED ORDER — MORPHINE SULFATE (PF) 0.5 MG/ML IJ SOLN
INTRAMUSCULAR | Status: DC | PRN
  Administered 2015-06-11: 3.5 mg via EPIDURAL
  Administered 2015-06-11: 1.5 mg via INTRAVENOUS

## 2015-06-11 MED ORDER — KETOROLAC TROMETHAMINE 30 MG/ML IJ SOLN
30.0000 mg | Freq: Four times a day (QID) | INTRAMUSCULAR | Status: AC | PRN
  Administered 2015-06-12: 30 mg via INTRAVENOUS
  Filled 2015-06-11: qty 1

## 2015-06-11 MED ORDER — CITRIC ACID-SODIUM CITRATE 334-500 MG/5ML PO SOLN
30.0000 mL | ORAL | Status: DC | PRN
  Administered 2015-06-11: 30 mL via ORAL
  Filled 2015-06-11: qty 15

## 2015-06-11 MED ORDER — PROMETHAZINE HCL 25 MG/ML IJ SOLN: 6.2500 mg | INTRAMUSCULAR | Status: DC | PRN

## 2015-06-11 MED ORDER — OXYTOCIN 10 UNIT/ML IJ SOLN
INTRAMUSCULAR | Status: AC
  Filled 2015-06-11: qty 4

## 2015-06-11 MED ORDER — HYDROMORPHONE HCL 1 MG/ML IJ SOLN: 0.2500 mg | INTRAMUSCULAR | Status: DC | PRN

## 2015-06-11 MED ORDER — DIPHENHYDRAMINE HCL 50 MG/ML IJ SOLN: 12.5000 mg | INTRAMUSCULAR | Status: DC | PRN

## 2015-06-11 MED ORDER — OXYTOCIN BOLUS FROM INFUSION: 500.0000 mL | INTRAVENOUS | Status: DC

## 2015-06-11 SURGICAL SUPPLY — 39 items
BENZOIN TINCTURE PRP APPL 2/3 (GAUZE/BANDAGES/DRESSINGS) ×3 IMPLANT
CLAMP CORD UMBIL (MISCELLANEOUS) IMPLANT
CLOSURE STERI STRIP 1/2 X4 (GAUZE/BANDAGES/DRESSINGS) ×2 IMPLANT
CLOSURE WOUND 1/2 X4 (GAUZE/BANDAGES/DRESSINGS) ×1
CLOTH BEACON ORANGE TIMEOUT ST (SAFETY) ×3 IMPLANT
DRSG OPSITE POSTOP 4X10 (GAUZE/BANDAGES/DRESSINGS) ×3 IMPLANT
DURAPREP 26ML APPLICATOR (WOUND CARE) ×3 IMPLANT
ELECT REM PT RETURN 9FT ADLT (ELECTROSURGICAL) ×3
ELECTRODE REM PT RTRN 9FT ADLT (ELECTROSURGICAL) ×1 IMPLANT
EXTRACTOR VACUUM KIWI (MISCELLANEOUS) IMPLANT
GLOVE BIO SURGEON STRL SZ 6 (GLOVE) ×3 IMPLANT
GLOVE BIOGEL PI IND STRL 6.5 (GLOVE) ×1 IMPLANT
GLOVE BIOGEL PI IND STRL 7.0 (GLOVE) ×1 IMPLANT
GLOVE BIOGEL PI INDICATOR 6.5 (GLOVE) ×2
GLOVE BIOGEL PI INDICATOR 7.0 (GLOVE) ×2
GOWN STRL REUS W/TWL LRG LVL3 (GOWN DISPOSABLE) ×6 IMPLANT
KIT ABG SYR 3ML LUER SLIP (SYRINGE) IMPLANT
NEEDLE HYPO 25X5/8 SAFETYGLIDE (NEEDLE) IMPLANT
NS IRRIG 1000ML POUR BTL (IV SOLUTION) ×3 IMPLANT
PACK C SECTION WH (CUSTOM PROCEDURE TRAY) ×3 IMPLANT
PAD OB MATERNITY 4.3X12.25 (PERSONAL CARE ITEMS) ×3 IMPLANT
PENCIL SMOKE EVAC W/HOLSTER (ELECTROSURGICAL) ×3 IMPLANT
RTRCTR C-SECT PINK 25CM LRG (MISCELLANEOUS) ×3 IMPLANT
SPONGE LAP 18X18 X RAY DECT (DISPOSABLE) ×3 IMPLANT
STRIP CLOSURE SKIN 1/2X4 (GAUZE/BANDAGES/DRESSINGS) ×2 IMPLANT
SUT MNCRL 0 VIOLET CTX 36 (SUTURE) ×2 IMPLANT
SUT MNCRL AB 3-0 PS2 27 (SUTURE) ×3 IMPLANT
SUT MON AB 2-0 SH 27 (SUTURE) ×2
SUT MON AB 2-0 SH27 (SUTURE) ×1 IMPLANT
SUT MONOCRYL 0 CTX 36 (SUTURE) ×4
SUT PLAIN 0 NONE (SUTURE) IMPLANT
SUT PLAIN 2 0 (SUTURE) ×4
SUT PLAIN ABS 2-0 CT1 27XMFL (SUTURE) ×2 IMPLANT
SUT VIC AB 0 CTX 36 (SUTURE) ×4
SUT VIC AB 0 CTX36XBRD ANBCTRL (SUTURE) ×2 IMPLANT
SUT VIC AB 2-0 CT1 27 (SUTURE) ×2
SUT VIC AB 2-0 CT1 TAPERPNT 27 (SUTURE) ×1 IMPLANT
TOWEL OR 17X24 6PK STRL BLUE (TOWEL DISPOSABLE) ×3 IMPLANT
TRAY FOLEY CATH SILVER 14FR (SET/KITS/TRAYS/PACK) ×3 IMPLANT

## 2015-06-11 NOTE — Progress Notes (Signed)
Orders received to D/C home. 

## 2015-06-11 NOTE — Anesthesia Procedure Notes (Signed)
Epidural Patient location during procedure: OB Start time: 06/11/2015 2:46 PM End time: 06/11/2015 2:53 PM  Staffing Anesthesiologist: Shona SimpsonHOLLIS, KEVIN D Performed by: anesthesiologist   Preanesthetic Checklist Completed: patient identified, site marked, surgical consent, pre-op evaluation, timeout performed, IV checked, risks and benefits discussed and monitors and equipment checked  Epidural Patient position: sitting Prep: ChloraPrep Patient monitoring: heart rate, continuous pulse ox and blood pressure Approach: midline Location: L3-L4 Injection technique: LOR saline  Needle:  Needle type: Tuohy  Needle gauge: 17 G Needle length: 9 cm Catheter type: closed end flexible Catheter size: 20 Guage Test dose: negative and 1.5% lidocaine  Assessment Events: blood not aspirated, injection not painful, no injection resistance and no paresthesia  Additional Notes LOR @ 5  Patient identified. Risks/Benefits/Options discussed with patient including but not limited to bleeding, infection, nerve damage, paralysis, failed block, incomplete pain control, headache, blood pressure changes, nausea, vomiting, reactions to medications, itching and postpartum back pain. Confirmed with bedside nurse the patient's most recent platelet count. Confirmed with patient that they are not currently taking any anticoagulation, have any bleeding history or any family history of bleeding disorders. Patient expressed understanding and wished to proceed. All questions were answered. Sterile technique was used throughout the entire procedure. Please see nursing notes for vital signs. Test dose was given through epidural catheter and negative prior to continuing to dose epidural or start infusion. Warning signs of high block given to the patient including shortness of breath, tingling/numbness in hands, complete motor block, or any concerning symptoms with instructions to call for help. Patient was given instructions on  fall risk and not to get out of bed. All questions and concerns addressed with instructions to call with any issues or inadequate analgesia.    Reason for block:procedure for pain

## 2015-06-11 NOTE — Brief Op Note (Signed)
06/11/2015  11:03 PM  PATIENT:  Erin Jacobson  25 y.o. female  PRE-OPERATIVE DIAGNOSIS:  Cesarean Section arrest of descent  POST-OPERATIVE DIAGNOSIS:  Cesarean Section arrest of descent  PROCEDURE:  Procedure(s): CESAREAN SECTION (N/A)  SURGEON:  Surgeon(s) and Role:    * Marlow Baarsyanna Solomon Skowronek, MD - Primary  ANESTHESIA:   epidural  EBL:  Total I/O In: 2400 [I.V.:2400] Out: 1100 [Urine:400; Blood:700]  BLOOD ADMINISTERED:none  DRAINS: none   LOCAL MEDICATIONS USED:  NONE  SPECIMEN:  No Specimen  DISPOSITION OF SPECIMEN:  N/A  COUNTS:  YES  TOURNIQUET:  * No tourniquets in log *  DICTATION: .Note written in EPIC  PLAN OF CARE: Admit to inpatient   PATIENT DISPOSITION:  PACU - hemodynamically stable.   Delay start of Pharmacological VTE agent (>24hrs) due to surgical blood loss or risk of bleeding: not applicable

## 2015-06-11 NOTE — Anesthesia Postprocedure Evaluation (Signed)
Anesthesia Post Note  Patient: Erin Jacobson  Procedure(s) Performed: Procedure(s) (LRB): CESAREAN SECTION (N/A)  Patient location during evaluation: PACU Anesthesia Type: Epidural Level of consciousness: awake Pain management: pain level controlled Vital Signs Assessment: post-procedure vital signs reviewed and stable Respiratory status: spontaneous breathing Cardiovascular status: stable Postop Assessment: no headache, no backache, epidural receding, patient able to bend at knees and no signs of nausea or vomiting Anesthetic complications: no    Last Vitals:  Filed Vitals:   06/11/15 2133 06/11/15 2316  BP: 114/69 125/93  Pulse: 128   Temp:  37.3 C  Resp: 18 23    Last Pain:  Filed Vitals:   06/11/15 2323  PainSc: 0-No pain                 Amedee Cerrone JR,JOHN Aksh Swart

## 2015-06-11 NOTE — Anesthesia Preprocedure Evaluation (Addendum)
Anesthesia Evaluation  Patient identified by MRN, date of birth, ID band Patient awake    Reviewed: Allergy & Precautions, NPO status , Patient's Chart, lab work & pertinent test results  Airway Mallampati: II  TM Distance: >3 FB Neck ROM: Full    Dental  (+) Teeth Intact   Pulmonary former smoker,    breath sounds clear to auscultation       Cardiovascular + Peripheral Vascular Disease   Rhythm:Regular Rate:Normal     Neuro/Psych negative neurological ROS  negative psych ROS   GI/Hepatic Neg liver ROS, GERD  ,  Endo/Other    Renal/GU Renal disease  negative genitourinary   Musculoskeletal negative musculoskeletal ROS (+)   Abdominal   Peds negative pediatric ROS (+)  Hematology negative hematology ROS (+)   Anesthesia Other Findings   Reproductive/Obstetrics (+) Pregnancy                            Lab Results  Component Value Date   WBC 12.2* 06/11/2015   HGB 10.3* 06/11/2015   HCT 32.5* 06/11/2015   MCV 74.7* 06/11/2015   PLT 391 06/11/2015   No results found for: INR, PROTIME   Anesthesia Physical Anesthesia Plan  ASA: II  Anesthesia Plan: Epidural   Post-op Pain Management:    Induction:   Airway Management Planned:   Additional Equipment:   Intra-op Plan:   Post-operative Plan:   Informed Consent: I have reviewed the patients History and Physical, chart, labs and discussed the procedure including the risks, benefits and alternatives for the proposed anesthesia with the patient or authorized representative who has indicated his/her understanding and acceptance.     Plan Discussed with: CRNA and Surgeon  Anesthesia Plan Comments: (For C/S with epidural in -situ @ 2143.)       Anesthesia Quick Evaluation

## 2015-06-11 NOTE — Op Note (Signed)
Cesarean Section Procedure Note  Pre-operative Diagnosis: 1. Intrauterine pregnancy at 1169w1d  2. Arrest of descent  Post-operative Diagnosis: same as above  Surgeon: Marlow Baarsyanna Mikalah Skyles, MD  Procedure: Primary low transverse cesarean section   Anesthesia: Epidural anesthesia  Estimated Blood Loss: 700 mL         Drains: Foley catheter         Specimens: none                 Complications:  None; patient tolerated the procedure well.         Disposition: PACU - hemodynamically stable.  Findings:  Normal uterus, tubes and ovaries bilaterally.  Viable female infant in the direct OP position.  True knot in the cord as well as cord around right ankle/leg, Apgars 7, 8, weight pending.  Arterial cord gas pH 7.22, base deficit 6.7.      Procedure Details   After epidural anesthesia was found to adequate , the patient was placed in the dorsal supine position with a leftward tilt, draped and prepped in the usual sterile manner. A Pfannenstiel incision was made and carried down through the subcutaneous tissue to the fascia. The fascia was incised in the midline and the fascial incision was extended laterally with Mayo scissors. The superior aspect of the fascial incision was grasped with two Kocher clamp, tented up and the rectus muscles dissected off sharply. The rectus was then dissected off with blunt dissection and Mayo scissors inferiorly. The rectus muscles were separated in the midline. The abdominal peritoneum was identified, tented up, entered bluntly, and the incision was extended superiorly and inferiorly with good visualization of the bladder. The Alexis retractor was deployed. The vesicouterine peritoneum was identified, tented up, entered sharply, and the bladder flap was created digitally. Scalpel was then used to make a low transverse incision on the uterus which was extended in the cephalad-caudad direction with blunt dissection. The fluid was clear. The fetal vertex was identified, and was  slowly elevated out of the pelvis and brought to the hysterotomy. The head was delivered, followed by the shoulders and body.The cord was wrapped once around the right ankle.  A true knot was noted. The cord was clamped and cut and the infant was passed to the waiting neonatologist. Placenta was then delivered spontaneously, intact and appear normal, the uterus was cleared of all clot and debris   The hysterotomy was repaired with #0 Monocryl in running locked fashion.  A second imbricating layer with #0 Monocryl was placed.  The serosal edges of the incision were oozy, and bovie cautery was used to achieve hemostasis.  The hysterotomy was reexamined and excellent hemostasis was noted.  The Alexis retractor was removed from the abdomen. The peritoneum was examined and all vessels noted to be hemostatic. The abdominal cavity was cleared of all clot and debris.  The peritoneum was closed with 2-0 vicryl in a running fashion, taking care to avoid the many large peritoneal vessels. The fascia and rectus muscles were inspected and were hemostatic. The fascia was closed with 0 Vicryl in a running fashion. The subcuticular layer was irrigated and all bleeders cauterized.  The subcutaneous layer was re approximated with interrupted 3-0 plain gut.  The skin was closed with 3-0 monocryl in a subcuticular fashion. The incision was dressed with benzoine, steri strips and pressure dressing. All sponge lap and needle counts were correct x3. Patient tolerated the procedure well and recovered in stable condition following the procedure.

## 2015-06-11 NOTE — MAU Note (Signed)
Pt here with c/o about 2300 tonight. Denies any bleeding. Reports some white discharge. Reports positive fetal movement. Denies any problems with placenta. Unsure about GBS.

## 2015-06-11 NOTE — Progress Notes (Signed)
Patient has pushed well for almost three hours.  Pushed for 1 hour with no descent and development of caput.  EFW 6-6.5# (PP 5#3oz).  She rested for approximately 45 minutes.  Baby was noted to be persistent OP.  Attempts at manual rotation were unsuccessful.  She pushed well for an additional two hours with minimal descent.  At this time, baby is not a candidate for an operative vaginal delivery.  Fetal tachycardia has developed over the last 30 minutes with a baseline increase to 175-180.  I recommend proceeding to the operating room for arrest of descent.  Discussed risk of infection, bleeding, damage to surrounding structures (including but not limited to bowel, bladder, tubes, ovaries, nerves, vessels, baby), risk of vte, risk of blood transfusion, risk of additional procedures.  Consent signed.  Ancef 2gm periop.

## 2015-06-11 NOTE — Transfer of Care (Signed)
Immediate Anesthesia Transfer of Care Note  Patient: Erin Jacobson  Procedure(s) Performed: Procedure(s): CESAREAN SECTION (N/A)  Patient Location: PACU  Anesthesia Type:Epidural  Level of Consciousness: awake, alert  and oriented  Airway & Oxygen Therapy: Patient Spontanous Breathing  Post-op Assessment: Report given to RN and Post -op Vital signs reviewed and stable  Post vital signs: Reviewed and stable  Last Vitals:  Filed Vitals:   06/11/15 2101 06/11/15 2133  BP: 111/63 114/69  Pulse: 127 128  Temp: 37.6 C   Resp:  18    Complications: No apparent anesthesia complications

## 2015-06-11 NOTE — Progress Notes (Signed)
Orders for BPP

## 2015-06-11 NOTE — Progress Notes (Signed)
Dr. Chestine Sporelark at bedside discussing risks and benefits of C/S. Pt denies further questions, consents signed.

## 2015-06-11 NOTE — H&P (Signed)
[age] y.o. G2P1001 @ 8111w1d presents with c/o LOF and painful ctx q2-4 minutes  Per RN, is grossly ruptured on exam and cervix has changed from 1 cm to 4 cm. Otherwise has good fetal movement and no bleeding.  Pregnancy c/b:  1.  H/o left upper extremity DVT in first trimester--pt has been non-compliant therapeutic lovenox.  She agreed mid-Feb to restart at least ppx lovenox, but has also been non-compliant w that therapy--reports last dose of lovenox > 1 week ago    Past Medical History  Diagnosis Date  . Complication of anesthesia   . GERD (gastroesophageal reflux disease)     no current meds for reflux  . Kidney stone   . DVT of upper extremity (deep vein thrombosis) [facility](HCC)     Past Surgical History  Procedure Laterality Date  . Appendectomy  04/01/2004    ex. lap.  . Knee surgery    . Cystoscopy with retrograde pyelogram, ureteroscopy and stent placement Left 05/05/2014    Procedure: LEFT  URETEROSCOPY,  RETROGRADE PYELOGRAM,  AND STENT PLACEMENT, STONE REMOVAL;  Surgeon: Crist FatBenjamin W Herrick, MD;  Location: [facility];  Service: Urology;  Laterality: Left;  . Holmium laser application Left 05/05/2014    Procedure: HOLMIUM LASER APPLICATION;  Surgeon: Crist FatBenjamin W Herrick, MD;  Location: [facility];  Service: Urology;  Laterality: Left;    OB History  Gravida Para Term Preterm AB SAB TAB Ectopic Multiple Living  2 1 1       2     # Outcome Date GA Lbr Len/2nd Weight Sex Delivery Anes PTL Lv  2 Current           1 Term 12/14/09 8973w0d  2.353 kg (5 lb 3 oz) F Vag-Spont   Y      Social History   Social History  . Marital Status: Single    Spouse Name: N/A  . Number of Children: N/A  . Years of Education: N/A   Occupational History  . Not on file.   Social History Main Topics  . Smoking status: Former Smoker    Types: Cigars    Quit date: 09/24/2014  . Smokeless tobacco: Never Used  . Alcohol Use: Yes     Comment: occasional/ not since pregnancy  . Drug Use: No  . Sexual Activity: Yes    Other Topics Concern  . Not on file   Social History Narrative   Review of patient's allergies indicates no known allergies.    Prenatal Transfer Tool  Maternal Diabetes: No Genetic Screening: Normal Maternal Ultrasounds/Referrals: Normal Fetal Ultrasounds or other Referrals:  None Maternal Substance Abuse:  No Significant Maternal Medications:  Meds include: Other: occ lovenox use--last >1 wk ago Significant Maternal Lab Results: Lab values include: Group B Strep negative  ABO, Rh: --/--/B POS (03/20 1025) Antibody: NEG (03/20 1025) Rubella: Immune RPR: Nonreactive (09/26 0000)  HBsAg: Negative (09/26 0000)  HIV: Non-reactive (09/26 0000)  GBS: Negative (02/28 0000)    Filed Vitals:   06/11/15 1139 06/11/15 1203  BP: 127/80 127/76  Pulse: 98 109  Temp:  98.4 F (36.9 C)  Resp:  18     General:  NAD Abdomen:  soft, gravid, EFW 6# Ex:  no edema, +SCDs SVE:  4/90/-2 per RN FHTs:  140s, alternating periods of min and mod var, occ var decels w ctx, + scalp stim w exam Toco:  q4-5 minutes   A/P   25 y.o. G2P1001 7311w1d presents with SROM, labor Admit to  L&D IV pain meds, epidural prn Upper extremity DVT--will need lovenox ppx (though patient has been non-compliant with recommended therapy throughout the pregnancy), SCDs in labor SVE unchanged over 4 hrs--will start pitocin FSR/ vtx/ GBS neg  Nehal Shives GEFFEL Shakaya Bhullar

## 2015-06-12 ENCOUNTER — Encounter (HOSPITAL_COMMUNITY): Payer: Self-pay | Admitting: *Deleted

## 2015-06-12 LAB — CREATININE, SERUM
CREATININE: 0.69 mg/dL (ref 0.44–1.00)
GFR calc Af Amer: >60 mL/min (ref >60)

## 2015-06-12 LAB — CBC
HCT: 26.6 % — ABNORMAL LOW (ref 36.0–46.0)
HEMOGLOBIN: 8.5 g/dL — AB (ref 12.0–15.0)
MCH: 23.9 pg — AB (ref 26.0–34.0)
MCHC: 32 g/dL (ref 30.0–36.0)
MCV: 74.9 fL — ABNORMAL LOW (ref 78.0–100.0)
Platelets: 341 10*3/uL (ref 150–400)
RBC: 3.55 MIL/uL — ABNORMAL LOW (ref 3.87–5.11)
RDW: 15.5 % (ref 11.5–15.5)
WBC: 22.1 10*3/uL — ABNORMAL HIGH (ref 4.0–10.5)

## 2015-06-12 LAB — RPR: RPR Ser Ql: NONREACTIVE

## 2015-06-12 MED ORDER — IBUPROFEN 600 MG PO TABS: 600.0000 mg | ORAL_TABLET | Freq: Four times a day (QID) | ORAL | Status: DC | PRN

## 2015-06-12 MED ORDER — ENOXAPARIN SODIUM 40 MG/0.4ML ~~LOC~~ SOLN
40.0000 mg | SUBCUTANEOUS | Status: DC
  Administered 2015-06-12 – 2015-06-14 (×3): 40 mg via SUBCUTANEOUS
  Filled 2015-06-12 (×4): qty 0.4

## 2015-06-12 MED ORDER — SIMETHICONE 80 MG PO CHEW
80.0000 mg | CHEWABLE_TABLET | Freq: Three times a day (TID) | ORAL | Status: DC
  Administered 2015-06-12 – 2015-06-14 (×7): 80 mg via ORAL
  Filled 2015-06-12 (×10): qty 1

## 2015-06-12 MED ORDER — WITCH HAZEL-GLYCERIN EX PADS: 1.0000 "application " | MEDICATED_PAD | CUTANEOUS | Status: DC | PRN

## 2015-06-12 MED ORDER — NALBUPHINE HCL 10 MG/ML IJ SOLN: 5.0000 mg | INTRAMUSCULAR | Status: DC | PRN

## 2015-06-12 MED ORDER — MENTHOL 3 MG MT LOZG
1.0000 | LOZENGE | OROMUCOSAL | Status: DC | PRN
  Filled 2015-06-12: qty 9

## 2015-06-12 MED ORDER — KETOROLAC TROMETHAMINE 30 MG/ML IJ SOLN
INTRAMUSCULAR | Status: AC
  Filled 2015-06-12: qty 1

## 2015-06-12 MED ORDER — SIMETHICONE 80 MG PO CHEW
80.0000 mg | CHEWABLE_TABLET | ORAL | Status: DC | PRN
  Administered 2015-06-13 (×2): 80 mg via ORAL
  Filled 2015-06-12: qty 1

## 2015-06-12 MED ORDER — LANOLIN HYDROUS EX OINT: 1.0000 "application " | TOPICAL_OINTMENT | CUTANEOUS | Status: DC | PRN

## 2015-06-12 MED ORDER — LACTATED RINGERS IV SOLN: INTRAVENOUS | Status: DC

## 2015-06-12 MED ORDER — DIPHENHYDRAMINE HCL 25 MG PO CAPS
25.0000 mg | ORAL_CAPSULE | Freq: Four times a day (QID) | ORAL | Status: DC | PRN
  Filled 2015-06-12: qty 1

## 2015-06-12 MED ORDER — ACETAMINOPHEN 500 MG PO TABS
1000.0000 mg | ORAL_TABLET | Freq: Four times a day (QID) | ORAL | Status: AC
  Administered 2015-06-12 (×3): 1000 mg via ORAL
  Filled 2015-06-12 (×3): qty 2

## 2015-06-12 MED ORDER — DIPHENHYDRAMINE HCL 25 MG PO CAPS: 25.0000 mg | ORAL_CAPSULE | ORAL | Status: DC | PRN

## 2015-06-12 MED ORDER — SCOPOLAMINE 1 MG/3DAYS TD PT72
1.0000 | MEDICATED_PATCH | Freq: Once | TRANSDERMAL | Status: DC
  Filled 2015-06-12: qty 1

## 2015-06-12 MED ORDER — ONDANSETRON HCL 4 MG/2ML IJ SOLN: 4.0000 mg | Freq: Three times a day (TID) | INTRAMUSCULAR | Status: DC | PRN

## 2015-06-12 MED ORDER — TETANUS-DIPHTH-ACELL PERTUSSIS 5-2.5-18.5 LF-MCG/0.5 IM SUSP
0.5000 mL | Freq: Once | INTRAMUSCULAR | Status: DC
  Filled 2015-06-12: qty 0.5

## 2015-06-12 MED ORDER — FERROUS SULFATE 325 (65 FE) MG PO TABS
325.0000 mg | ORAL_TABLET | Freq: Two times a day (BID) | ORAL | Status: DC
  Administered 2015-06-12 – 2015-06-14 (×5): 325 mg via ORAL
  Filled 2015-06-12 (×5): qty 1

## 2015-06-12 MED ORDER — NALBUPHINE HCL 10 MG/ML IJ SOLN: 5.0000 mg | Freq: Once | INTRAMUSCULAR | Status: DC | PRN

## 2015-06-12 MED ORDER — PRENATAL MULTIVITAMIN CH
1.0000 | ORAL_TABLET | Freq: Every day | ORAL | Status: DC
Start: 2015-06-12 — End: 2015-06-14
  Administered 2015-06-12 – 2015-06-14 (×3): 1 via ORAL
  Filled 2015-06-12 (×4): qty 1

## 2015-06-12 MED ORDER — NALOXONE HCL 0.4 MG/ML IJ SOLN: 0.4000 mg | INTRAMUSCULAR | Status: DC | PRN

## 2015-06-12 MED ORDER — IBUPROFEN 600 MG PO TABS
600.0000 mg | ORAL_TABLET | Freq: Four times a day (QID) | ORAL | Status: DC
  Administered 2015-06-12 – 2015-06-14 (×10): 600 mg via ORAL
  Filled 2015-06-12 (×10): qty 1

## 2015-06-12 MED ORDER — SIMETHICONE 80 MG PO CHEW
80.0000 mg | CHEWABLE_TABLET | ORAL | Status: DC
  Administered 2015-06-13: 80 mg via ORAL
  Filled 2015-06-12 (×4): qty 1

## 2015-06-12 MED ORDER — ACETAMINOPHEN 325 MG PO TABS
650.0000 mg | ORAL_TABLET | ORAL | Status: DC | PRN
  Administered 2015-06-13: 650 mg via ORAL
  Filled 2015-06-12: qty 2

## 2015-06-12 MED ORDER — DIBUCAINE 1 % RE OINT
1.0000 "application " | TOPICAL_OINTMENT | RECTAL | Status: DC | PRN
  Filled 2015-06-12: qty 28

## 2015-06-12 MED ORDER — NALOXONE HCL 2 MG/2ML IJ SOSY
1.0000 ug/kg/h | PREFILLED_SYRINGE | INTRAVENOUS | Status: DC | PRN
  Filled 2015-06-12: qty 2

## 2015-06-12 MED ORDER — SENNOSIDES-DOCUSATE SODIUM 8.6-50 MG PO TABS
2.0000 | ORAL_TABLET | ORAL | Status: DC
  Administered 2015-06-13 (×2): 2 via ORAL
  Filled 2015-06-12 (×4): qty 2

## 2015-06-12 MED ORDER — OXYTOCIN 10 UNIT/ML IJ SOLN
2.5000 [IU]/h | INTRAMUSCULAR | Status: AC
Start: 2015-06-12 — End: 2015-06-12

## 2015-06-12 MED ORDER — DIPHENHYDRAMINE HCL 50 MG/ML IJ SOLN: 12.5000 mg | INTRAMUSCULAR | Status: DC | PRN

## 2015-06-12 MED ORDER — OXYCODONE HCL 5 MG PO TABS
5.0000 mg | ORAL_TABLET | ORAL | Status: DC | PRN
  Administered 2015-06-13: 5 mg via ORAL
  Filled 2015-06-12: qty 1

## 2015-06-12 MED ORDER — SODIUM CHLORIDE 0.9% FLUSH: 3.0000 mL | INTRAVENOUS | Status: DC | PRN

## 2015-06-12 MED ORDER — OXYCODONE HCL 5 MG PO TABS
10.0000 mg | ORAL_TABLET | ORAL | Status: DC | PRN
  Administered 2015-06-13 – 2015-06-14 (×4): 10 mg via ORAL
  Filled 2015-06-12 (×4): qty 2

## 2015-06-12 NOTE — Addendum Note (Signed)
Addendum  created 06/12/15 0749 by Earmon PhoenixValerie P Joylyn Duggin, CRNA   Modules edited: Clinical Notes   Clinical Notes:  File: [id number]

## 2015-06-12 NOTE — Anesthesia Postprocedure Evaluation (Signed)
Anesthesia Post Note  Patient: Erin Jacobson  Procedure(s) Performed: Procedure(s) (LRB): CESAREAN SECTION (N/A)  Patient location during evaluation: Mother Baby Anesthesia Type: Epidural Level of consciousness: awake and alert Pain management: pain level controlled Vital Signs Assessment: post-procedure vital signs reviewed and stable Respiratory status: spontaneous breathing Cardiovascular status: stable Postop Assessment: no headache, no backache, epidural receding and patient able to bend at knees Anesthetic complications: no    Last Vitals:  Filed Vitals:   06/12/15 0250 06/12/15 0355  BP: 122/74 124/69  Pulse: 81 112  Temp: 37.1 C 37.1 C  Resp: 18 18    Last Pain:  Filed Vitals:   06/12/15 0433  PainSc: 2                  Edison PaceWILKERSON,Davonne Baby

## 2015-06-12 NOTE — Progress Notes (Addendum)
  Patient is eating, ambulating, voiding.  Pain control is good.  Filed Vitals:   06/12/15 0050 06/12/15 0150 06/12/15 0250 06/12/15 0355  BP: 123/75 117/73 122/74 124/69  Pulse: 102 99 81 112  Temp: 98.6 F (37 C)  98.7 F (37.1 C) 98.7 F (37.1 C)  TempSrc: Oral  Oral Oral  Resp: 18 20 18 18   Height:      Weight:      SpO2: 99% 98% 98% 99%    lungs:   clear to auscultation cor:    RRR Abdomen:  soft, appropriate tenderness, incisions intact and without erythema or exudate ex:    no cords   Lab Results  Component Value Date   WBC 22.1* 06/12/2015   HGB 8.5* 06/12/2015   HCT 26.6* 06/12/2015   MCV 74.9* 06/12/2015   PLT 341 06/12/2015    --/--/B POS (03/20 1025)/RI  A/P    Post operative day 1.  Routine post op and postpartum care.  Expect d/c routine.  Will restart Lovenox at 12 today for upper extremity DVT.  Percocet for pain control. Plan is for circ in office.

## 2015-06-12 NOTE — Lactation Note (Signed)
This note was copied from a baby's chart. Lactation Consultation Note Experienced mom tried to BF her 1st child but had difficulty latching baby d/t cleft lip. Mom pumped and bottle fed for 6 months until baby became allergic to BM from the dairy. Mom states this baby is latching well and feels BF is going good. Hand expression taught to Mom w/drop of colostrum noted. Mom has pendulum breast w/everted nipples, encouraged to roll nipples to stimulate semi-short shaft to evert well for a deeper latch. Referred to Baby and Me Book in Breastfeeding section Pg. 22-23 for position options and Proper latch demonstration. Educated about newborn behavior, I&O, STS, supply and demand. WH/LC brochure given w/resources, support groups and LC services. Patient Name: Erin Jacobson Reason for consult: Initial assessment   Maternal Data Has patient been taught Hand Expression?: Yes Does the patient have breastfeeding experience prior to this delivery?: Yes  Feeding    LATCH Score/Interventions       Type of Nipple: Everted at rest and after stimulation  Comfort (Breast/Nipple): Soft / non-tender     Intervention(s): Breastfeeding basics reviewed;Support Pillows;Position options;Skin to skin     Lactation Tools Discussed/Used WIC Program: Yes   Consult Status Consult Status: Follow-up Date: 06/13/15 Follow-up type: In-patient    Charyl DancerCARVER, Dejia Ebron G Jacobson, 5:17 AM

## 2015-06-13 NOTE — Lactation Note (Signed)
This note was copied from a baby's chart. Lactation Consultation Note  Patient Name: Erin Jacobson: 06/13/2015  Mom was working with the nursing staff and was not able to talk to lactation at time of visit. Lactation to f/u with mom later.    Maternal Data    Feeding    LATCH Score/Interventions                      Lactation Tools Discussed/Used     Consult Status      [name] 06/13/2015, 4:22 PM

## 2015-06-13 NOTE — Lactation Note (Signed)
This note was copied from a baby's chart. Lactation Consultation Note  Patient Name: Erin Jacobson BJYNW'GToday's Date: 06/13/2015 Reason for consult: Follow-up assessment Baby at 47 hr of life and mom is worried baby is not getting enough. Discussed baby behavior, feeding frequency, baby belly size, voids, wt loss, breast changes, and nipple care. Mom stated she can manually express and has a spoon in the room. Encouraged her to offer her expressed milk is she does not see swallows. She has a DEBP set up too. She is aware of OP services and support group. She will call as needed for bf support.    Maternal Data    Feeding Feeding Type: Breast Fed Length of feed: 30 min  LATCH Score/Interventions                      Lactation Tools Discussed/Used     Consult Status Consult Status: Follow-up Date: 06/14/15 Follow-up type: In-patient    Rulon Eisenmengerlizabeth E Lander Eslick 06/13/2015, 9:29 PM

## 2015-06-13 NOTE — Progress Notes (Signed)
Patient is eating, ambulating.  Pain control is good.  Pt was unable to empty her bladder fully and felt discomfort,  She alerted nurse who catheterized her for 1300cc urine.  Appropriate lochia.  Filed Vitals:   06/12/15 1114 06/12/15 1550 06/12/15 1950 06/13/15 0517  BP: 113/67 110/68 107/72 111/65  Pulse: 84 81 117 84  Temp: 98.3 F (36.8 C) 98.3 F (36.8 C) 98.2 F (36.8 C) 98.5 F (36.9 C)  TempSrc: Oral Oral Oral   Resp: 18 81 18 20  Height:      Weight:      SpO2:   100%     [employer]: c/d/i Ext: no CT   Lab Results  Component Value Date   WBC 22.1* 06/12/2015   HGB 8.5* 06/12/2015   HCT 26.6* 06/12/2015   MCV 74.9* 06/12/2015   PLT 341 06/12/2015    --/--/B POS (03/20 1025) A/P Post op day #2 s/p c/s for arrest of descent. Will monitor voiding and catheterize, possibly place foley if still unable to fully void. Discussed with patient the importance of Lovenox postpartum as she is now at highest risk for DVT/PE which can be deadly.  Pt agrees to continue Lovenox at discharge and states she has Rx at home. Encourage ambulation and advised pt to where SCDs while in bed.  Routine care.   Philip AspenALLAHAN, Ademide Schaberg

## 2015-06-13 NOTE — Progress Notes (Signed)
Upon assessment of patient foley catheter was present. Per patient inserted around 1600. Previous nurse reported that they put a new catheter in but she did not chart it.

## 2015-06-14 MED ORDER — OXYCODONE HCL 5 MG PO TABS: 5.0000 mg | ORAL_TABLET | ORAL | Status: DC | PRN

## 2015-06-14 NOTE — Progress Notes (Signed)
POD#3 Pt had large PVR and urinary retention yesterday. Cath replaced.  Will do voiding trial this am and then discharge with or without cath. Will continue Lovenox

## 2015-06-14 NOTE — Lactation Note (Signed)
This note was copied from a baby's chart. Lactation Consultation Note  Patient Name: Erin Jacobson ZOXWR'UToday's Date: 06/14/2015 Reason for consult: Follow-up assessment Baby at 59 hr of life and mom reports bf is going well. She plans to go home today. She denies breast or nipple pain, voiced no concerns. Discussed baby behavior, feeding frequency, pumping, baby belly size, voids, wt loss, breast changes, and nipple care. Encouraged mom to call with questions. She is aware of OP services and support group.    Maternal Data    Feeding Feeding Type: Breast Fed Length of feed: 12 min  LATCH Score/Interventions                      Lactation Tools Discussed/Used     Consult Status Consult Status: Complete Follow-up type: Call as needed    Erin Jacobson 06/14/2015, 10:03 AM

## 2015-06-16 ENCOUNTER — Encounter (HOSPITAL_COMMUNITY): Payer: Self-pay | Admitting: Certified Nurse Midwife

## 2015-06-16 ENCOUNTER — Inpatient Hospital Stay (HOSPITAL_COMMUNITY)
Admission: AD | Admit: 2015-06-16 | Discharge: 2015-06-16 | Disposition: A | Discharge status: Home / Self Care | Payer: BLUE CROSS/BLUE SHIELD | Source: Ambulatory Visit | Attending: Obstetrics | Admitting: Obstetrics

## 2015-06-16 DIAGNOSIS — R339 Retention of urine, unspecified: Secondary | ICD-10-CM

## 2015-06-16 DIAGNOSIS — Z466 Encounter for fitting and adjustment of urinary device: Secondary | ICD-10-CM | POA: Diagnosis not present

## 2015-06-16 NOTE — Discharge Instructions (Signed)
Acute Urinary Retention, Female °Acute urinary retention is the temporary inability to urinate. This is an uncommon problem in women. It can be caused by: °· Infection. °· A side effect of a medicine. °· A problem in a nearby organ that presses or squeezes on the bladder or the urethra (the tube that drains the bladder). °· Psychological problems. °·  Surgery on your bladder, urethra, or pelvic organs that causes obstruction to the outflow of urine from your bladder. °HOME CARE INSTRUCTIONS  °If you are sent home with a Foley catheter and a drainage system, you will need to discuss the best course of action with your health care provider. While the catheter is in, maintain a good intake of fluids. Keep the drainage bag emptied and lower than your catheter. This is so that contaminated urine will not flow back into your bladder, which could lead to a urinary tract infection. °There are two main types of drainage bags. One is a large bag that usually is used at night. It has a good capacity that will allow you to sleep through the night without having to empty it. The second type is called a leg bag. It has a smaller capacity so it needs to be emptied more frequently. However, the main advantage is that it can be attached by a leg strap and goes underneath your clothing, allowing you the freedom to move about or leave your home. °Only take over-the-counter or prescription medicines for pain, discomfort, or fever as directed by your health care provider.  °SEEK MEDICAL CARE IF: °· You develop a low-grade fever. °· You experience spasms or leakage of urine with the spasms. °SEEK IMMEDIATE MEDICAL CARE IF:  °· You develop chills or fever. °· Your catheter stops draining urine. °· Your catheter falls out. °· You start to develop increased bleeding that does not respond to rest and increased fluid intake. °MAKE SURE YOU: °· Understand these instructions. °· Will watch your condition. °· Will get help right away if you are  not doing well or get worse. °  °This information is not intended to replace advice given to you by your health care provider. Make sure you discuss any questions you have with your health care provider. °  °Document Released: 03/09/2006 Document Revised: 07/25/2014 Document Reviewed: 08/19/2012 °Elsevier Interactive Patient Education ©2016 Elsevier Inc. ° °

## 2015-06-16 NOTE — MAU Note (Signed)
C/O on 3/20, pt went home with her catheter, was instructed to come in today to have catheter removed.  Has small amount of bleeding, is taking oxycodone for pain.

## 2015-06-16 NOTE — MAU Provider Note (Signed)
Ms. Erin Jacobson is a 25 y.o. G2P2002 who had C/S on 06/11/15. At time of discharge on 06/14/15 she had PVR and was sent home with a Foley catheter in place. She was instructed to return to MAU today for removal and trial of void. She states mild abdominal pain and scant bleeding. She denies fever.   Medical screening exam complete  Order placed for RN to remove Foley catheter. Patient will have voiding trial and PV bladder scan.  Patient unable to void. Bladder scan shows 364 mL volume of bladder.  Discussed patient with Dr. Chestine Sporelark. Advised replace of Foley today and have patient follow-up in the office with her on Thursday or Friday.    Erin LowensteinJulie N Alyra Patty, PA-C 06/16/2015 5:30 PM

## 2015-06-24 NOTE — Discharge Summary (Signed)
Obstetric Discharge Summary Reason for Admission: onset of labor and rupture of membranes Prenatal Procedures: NST and ultrasound Intrapartum Procedures: cesarean: low cervical, transverse Postpartum Procedures: none Complications-Operative and Postpartum: Urinary retention HEMOGLOBIN  Date Value Ref Range Status  06/12/2015 8.5* 12.0 - 15.0 g/dL Final   HCT  Date Value Ref Range Status  06/12/2015 26.6* 36.0 - 46.0 % Final    Physical Exam:  General: alert Lochia: appropriate Uterine Fundus: firm Incision: healing well DVT Evaluation: No evidence of DVT seen on physical exam.  Discharge Diagnoses: Arrest of desent and H.O. DVT and Urinary retention  Discharge Information: Date: 06/24/2015 Activity: Pt sent home with catheter and leg bag Diet: routine Medications: Ibuprofen and lovenox Condition: stable Instructions: refer to practice specific booklet Discharge to: home   Newborn Data: Live born female  Birth Weight: 6 lb 10.4 oz (3015 g) APGAR: 7, 8  Home with mother.  Erin Jacobson E 06/24/2015, 8:09 PM

## 2015-07-20 ENCOUNTER — Encounter (HOSPITAL_COMMUNITY): Payer: Self-pay

## 2015-07-20 ENCOUNTER — Inpatient Hospital Stay (HOSPITAL_COMMUNITY)
Admission: AD | Admit: 2015-07-20 | Discharge: 2015-07-21 | Disposition: A | Discharge status: Home / Self Care | Payer: BLUE CROSS/BLUE SHIELD | Source: Ambulatory Visit | Attending: Obstetrics and Gynecology | Admitting: Obstetrics and Gynecology

## 2015-07-20 DIAGNOSIS — R350 Frequency of micturition: Secondary | ICD-10-CM

## 2015-07-20 DIAGNOSIS — R339 Retention of urine, unspecified: Secondary | ICD-10-CM | POA: Diagnosis not present

## 2015-07-20 DIAGNOSIS — R109 Unspecified abdominal pain: Secondary | ICD-10-CM | POA: Diagnosis present

## 2015-07-20 DIAGNOSIS — Z87891 Personal history of nicotine dependence: Secondary | ICD-10-CM | POA: Insufficient documentation

## 2015-07-20 DIAGNOSIS — Z87442 Personal history of urinary calculi: Secondary | ICD-10-CM | POA: Insufficient documentation

## 2015-07-20 DIAGNOSIS — Z86718 Personal history of other venous thrombosis and embolism: Secondary | ICD-10-CM | POA: Insufficient documentation

## 2015-07-20 DIAGNOSIS — O9089 Other complications of the puerperium, not elsewhere classified: Secondary | ICD-10-CM | POA: Diagnosis not present

## 2015-07-20 NOTE — MAU Note (Addendum)
C/S on 06/11/2014.  Incisional pain and doesn't feel like bladder is empting out like it did.  When was discharged, went home with foley.  Two later nurses took it out at dr office. Was given cath kit to cath self if needed.  For last 2 days, had to cath self 5 times because felt pain and pressure.  Voids but still feels like has to void more so puts in cath and urine comes out in small amount, light, cloudy.  States every since original cath removed, has noted an odor to urine.

## 2015-07-21 DIAGNOSIS — R339 Retention of urine, unspecified: Secondary | ICD-10-CM | POA: Diagnosis not present

## 2015-07-21 DIAGNOSIS — O9089 Other complications of the puerperium, not elsewhere classified: Secondary | ICD-10-CM | POA: Diagnosis not present

## 2015-07-21 LAB — URINALYSIS, ROUTINE W REFLEX MICROSCOPIC
BILIRUBIN URINE: NEGATIVE
Glucose, UA: NEGATIVE mg/dL
HGB URINE DIPSTICK: NEGATIVE
Ketones, ur: NEGATIVE mg/dL
Leukocytes, UA: NEGATIVE
NITRITE: NEGATIVE
PROTEIN: NEGATIVE mg/dL
Specific Gravity, Urine: 1.015 (ref 1.005–1.030)
pH: 5.5 (ref 5.0–8.0)

## 2015-07-21 LAB — POCT PREGNANCY, URINE: PREG TEST UR: NEGATIVE

## 2015-07-21 MED ORDER — PHENAZOPYRIDINE HCL 200 MG PO TABS: 200.0000 mg | ORAL_TABLET | Freq: Three times a day (TID) | ORAL | Status: DC

## 2015-07-21 MED ORDER — PHENAZOPYRIDINE HCL 100 MG PO TABS
200.0000 mg | ORAL_TABLET | Freq: Once | ORAL | Status: AC
  Administered 2015-07-21: 200 mg via ORAL
  Filled 2015-07-21: qty 2

## 2015-07-21 NOTE — MAU Provider Note (Signed)
History     CSN: [id number]  Arrival date and time: 07/20/15 2324   First Provider Initiated Contact with Patient 07/21/15 0020      Chief Complaint  Patient presents with  . Incisional Pain   HPI  [name] is a 25 y.o.female G2P2002 status post primary cesarean section on 3/20; patient had urinary retention and was sent home with a urinary catheter. She had her catheter in for 2 weeks and it was then removed. She was given self cath kits for home incase she felt retention again. She started feeling like she was retaining urine again yesterday and used the self cath kid to cath her self at home. She feels that the abdominal pain that she is experiencing is the same pain she experienced prior to having the catheter placed in the hospital.   She was never seen by a urologist for the urinary retention, however she does have a urologist that she plans to call.   She recently noticed that her urine was cloudy, with an odor. Denies vaginal bleeding.   OB History    Gravida Para Term Preterm AB TAB SAB Ectopic Multiple Living   0 2      Past Medical History  Diagnosis Date  . Complication of anesthesia   . GERD (gastroesophageal reflux disease)     no current meds for reflux  . Kidney stone   . DVT of upper extremity (deep vein thrombosis) [facility])     Past Surgical History  Procedure Laterality Date  . Appendectomy  04/01/2004    ex. lap.  . Knee surgery    . Cystoscopy with retrograde pyelogram, ureteroscopy and stent placement Left 05/05/2014    Procedure: LEFT  URETEROSCOPY,  RETROGRADE PYELOGRAM,  AND STENT PLACEMENT, STONE REMOVAL;  Surgeon: Crist Fat, MD;  Location: [facility];  Service: Urology;  Laterality: Left;  . Holmium laser application Left 05/05/2014    Procedure: HOLMIUM LASER APPLICATION;  Surgeon: Crist Fat, MD;  Location: [facility];  Service: Urology;  Laterality: Left;  . Cesarean section N/A 06/11/2015    Procedure: CESAREAN SECTION;   Surgeon: Marlow Baars, MD;  Location: [facility];  Service: Obstetrics;  Laterality: N/A;    Family History  Problem Relation Age of Onset  . Anesthesia problems Mother     post-op N/V, hard to wake up  . Arthritis Mother     Social History  Substance Use Topics  . Smoking status: Former Smoker    Types: Cigars    Quit date: 09/24/2014  . Smokeless tobacco: Never Used  . Alcohol Use: Yes     Comment: occasional/ not since pregnancy    Allergies: No Known Allergies  Prescriptions prior to admission  Medication Sig Dispense Refill Last Dose  . acetaminophen (TYLENOL) 325 MG tablet Take 650 mg by mouth every 6 (six) hours as needed.   Past Week at Unknown time  . enoxaparin (LOVENOX) 60 MG/0.6ML injection Inject 0.6 mLs into the skin every evening.   12 06/15/2015 at 1800  . folic acid (FOLVITE) 400 MCG tablet Take 400 mcg by mouth at bedtime.   06/15/2015 at Unknown time  . oxyCODONE (OXY IR/ROXICODONE) 5 MG immediate release tablet Take 5 mg by mouth every 4 (four) hours as needed (for pain scale 4-7).   06/16/2015 at 1400  . Prenatal Vit-Fe Fumarate-FA (PRENATAL MULTIVITAMIN) TABS tablet Take 1 tablet by mouth at bedtime.    06/15/2015  at Unknown time   Results for orders placed or performed during the hospital encounter of 07/20/15 (from the past 48 hour(s))  Urinalysis, Routine w reflex microscopic (not at [facility])     Status: None   Collection Time: 07/20/15 11:30 PM  Result Value Ref Range   Color, Urine YELLOW YELLOW   APPearance CLEAR CLEAR   Specific Gravity, Urine 1.015 1.005 - 1.030   pH 5.5 5.0 - 8.0   Glucose, UA NEGATIVE NEGATIVE mg/dL   Hgb urine dipstick NEGATIVE NEGATIVE   Bilirubin Urine NEGATIVE NEGATIVE   Ketones, ur NEGATIVE NEGATIVE mg/dL   Protein, ur NEGATIVE NEGATIVE mg/dL   Nitrite NEGATIVE NEGATIVE   Leukocytes, UA NEGATIVE NEGATIVE    Comment: MICROSCOPIC NOT DONE ON URINES WITH NEGATIVE PROTEIN, BLOOD, LEUKOCYTES, NITRITE, OR GLUCOSE <1000 mg/dL.    Pregnancy, urine POC     Status: None   Collection Time: 07/21/15  1:04 AM  Result Value Ref Range   Preg Test, Ur NEGATIVE NEGATIVE    Comment:        THE SENSITIVITY OF THIS METHODOLOGY IS >24 mIU/mL     Review of Systems  Constitutional: Negative for fever.  Gastrointestinal: Positive for constipation.  Genitourinary: Positive for urgency and frequency. Negative for dysuria, hematuria and flank pain.  Musculoskeletal: Positive for back pain.   Physical Exam   Blood pressure 116/75, pulse 84, temperature 98.6 F (37 C), temperature source Oral, resp. rate 16, height 5\' 1"  (1.549 m), weight 138 lb 12 oz (62.937 kg), SpO2 98 %, currently breastfeeding.  Physical Exam  Constitutional: She is oriented to person, place, and time. She appears well-developed and well-nourished. No distress.  Respiratory: Effort normal.  GI: Soft. There is tenderness in the suprapubic area. There is no rigidity, no rebound and no guarding.    cesarean incision approximated;clean, dry, intact. No erythema   Musculoskeletal: Normal range of motion.  Neurological: She is alert and oriented to person, place, and time.  Skin: Skin is warm. She is not diaphoretic.  Psychiatric: Her behavior is normal.    MAU Course  Procedures  None  MDM PRV: 138 per bladder scanner.  Discussed patient with Dr. Tenny Crawoss @ [phone]; Will send the patient home with pyridium and have her call urology on Monday.  Pyridium 200 mg PO given in MAU  Assessment and Plan   A:  1. Urinary retention   2. Urinary frequency     P:  Discharge home in stable condition RX: Pyridium Call urology on Monday to schedule appointment Return to MAU if symptoms worsen  Duane LopeJennifer I Rasch, NP 07/21/2015 1:20 AM

## 2015-07-21 NOTE — Discharge Instructions (Signed)
Urinary Frequency The number of times a normal person urinates depends upon how much liquid they take in and how much liquid they are losing. If the temperature is hot and there is high humidity, then the person will sweat more and usually breathe a little more frequently. These factors decrease the amount of frequency of urination that would be considered normal. The amount you drink is easily determined, but the amount of fluid lost is sometimes more difficult to calculate.  Fluid is lost in two ways:  Sensible fluid loss is usually measured by the amount of urine that you get rid of. Losses of fluid can also occur with diarrhea.  Insensible fluid loss is more difficult to measure. It is caused by evaporation. Insensible loss of fluid occurs through breathing and sweating. It usually ranges from a little less than a quart to a little more than a quart of fluid a day. In normal temperatures and activity levels, the average person may urinate 4 to 7 times in a 24-hour period. Needing to urinate more often than that could indicate a problem. If one urinates 4 to 7 times in 24 hours and has large volumes each time, that could indicate a different problem from one who urinates 4 to 7 times a day and has small volumes. The time of urinating is also important. Most urinating should be done during the waking hours. Getting up at night to urinate frequently can indicate some problems. CAUSES  The bladder is the organ in your lower abdomen that holds urine. Like a balloon, it swells some as it fills up. Your nerves sense this and tell you it is time to head for the bathroom. There are a number of reasons that you might feel the need to urinate more often than usual. They include:  Urinary tract infection. This is usually associated with other signs such as burning when you urinate.  In men, problems with the prostate (a walnut-size gland that is located near the tube that carries urine out of your body). There  are two reasons why the prostate can cause an increased frequency of urination:  An enlarged prostate that does not let the bladder empty well. If the bladder only half empties when you urinate, then it only has half the capacity to fill before you have to urinate again.  The nerves in the bladder become more hypersensitive with an increased size of the prostate even if the bladder empties completely.  Pregnancy.  Obesity. Excess weight is more likely to cause a problem for women than for men.  Bladder stones or other bladder problems.  Caffeine.  Alcohol.  Medications. For example, drugs that help the body get rid of extra fluid (diuretics) increase urine production. Some other medicines must be taken with lots of fluids.  Muscle or nerve weakness. This might be the result of a spinal cord injury, a stroke, multiple sclerosis, or Parkinson disease.  Long-standing diabetes can decrease the sensation of the bladder. This loss of sensation makes it harder to sense the bladder needs to be emptied. Over a period of years, the bladder is stretched out by constant overfilling. This weakens the bladder muscles so that the bladder does not empty well and has less capacity to fill with new urine.  Interstitial cystitis (also called painful bladder syndrome). This condition develops because the tissues that line the inside of the bladder are inflamed (inflammation is the body's way of reacting to injury or infection). It causes pain and frequent  urination. It occurs in women more often than in men. DIAGNOSIS   To decide what might be causing your urinary frequency, your health care provider will probably:  Ask about symptoms you have noticed.  Ask about your overall health. This will include questions about any medications you are taking.  Do a physical examination.  Order some tests. These might include:  A blood test to check for diabetes or other health issues that could be contributing  to the problem.  Urine testing. This could measure the flow of urine and the pressure on the bladder.  A test of your neurological system (the brain, spinal cord, and nerves). This is the system that senses the need to urinate.  A bladder test to check whether it is emptying completely when you urinate.  Cystoscopy. This test uses a thin tube with a tiny camera on it. It offers a look inside your urethra and bladder to see if there are problems.  Imaging tests. You might be given a contrast dye and then asked to urinate. X-rays are taken to see how your bladder is working. TREATMENT  It is important for you to be evaluated to determine if the amount or frequency that you have is unusual or abnormal. If it is found to be abnormal, the cause should be determined and this can usually be found out easily. Depending upon the cause, treatment could include medication, stimulation of the nerves, or surgery. There are not too many things that you can do as an individual to change your urinary frequency. It is important that you balance the amount of fluid intake needed to compensate for your activity and the temperature. Medical problems will be diagnosed and taken care of by your physician. There is no particular bladder training such as Kegel exercises that you can do to help urinary frequency. This is an exercise that is usually recommended for people who have leaking of urine when they laugh, cough, or sneeze. HOME CARE INSTRUCTIONS   Take any medications your health care provider prescribed or suggested. Follow the directions carefully.  Practice any lifestyle changes that are recommended. These might include:  Drinking less fluid or drinking at different times of the day. If you need to urinate often during the night, for example, you may need to stop drinking fluids early in the evening.  Cutting down on caffeine or alcohol. They both can make you need to urinate more often than normal. Caffeine  is found in coffee, tea, and sodas.  Losing weight, if that is recommended.  Keep a journal or a log. You might be asked to record how much you drink and when and where you feel the need to urinate. This will also help evaluate how well the treatment provided by your physician is working. SEEK MEDICAL CARE IF:   Your need to urinate often gets worse.  You feel increased pain or irritation when you urinate.  You notice blood in your urine.  You have questions about any medications that your health care provider recommended.  You notice blood, pus, or swelling at the site of any test or treatment procedure.  You develop a fever of more than 100.49F (38.1C). SEEK IMMEDIATE MEDICAL CARE IF:  You develop a fever of more than 102.53F (38.9C).   This information is not intended to replace advice given to you by your health care provider. Make sure you discuss any questions you have with your health care provider.   Document Released: 01/04/2009 Document Revised:  03/31/2014 Document Reviewed: 01/04/2009 Elsevier Interactive Patient Education 2016 Elsevier Inc.  Acute Urinary Retention, Female Urinary retention means you are unable to pee completely or at all (empty your bladder). HOME CARE  Drink enough fluids to keep your pee (urine) clear or pale yellow.  If you are sent home with a tube that drains the bladder (catheter), there will be a drainage bag attached to it. There are two types of bags. One is big that you can wear at night without having to empty it. One is smaller and needs to be emptied more often.  Keep the drainage bag emptied.  Keep the drainage bag lower than the tube.  Only take medicine as told by your doctor. GET HELP IF:  You have a low-grade fever.  You have spasms or you are leaking pee when you have spasms. GET HELP RIGHT AWAY IF:   You have chills or a fever.  Your catheter stops draining pee.  Your catheter falls out.  You have increased  bleeding that does not stop after you have rested and increased the amount of fluids you had been drinking. MAKE SURE YOU:   Understand these instructions.  Will watch your condition.  Will get help right away if you are not doing well or get worse.   This information is not intended to replace advice given to you by your health care provider. Make sure you discuss any questions you have with your health care provider.   Document Released: 08/27/2007 Document Revised: 07/25/2014 Document Reviewed: 08/19/2012 Elsevier Interactive Patient Education [employer] Else[employer] Inc.

## 2015-08-24 DIAGNOSIS — Z79899 Other long term (current) drug therapy: Secondary | ICD-10-CM | POA: Diagnosis not present

## 2015-08-24 DIAGNOSIS — R102 Pelvic and perineal pain: Secondary | ICD-10-CM | POA: Insufficient documentation

## 2015-08-24 DIAGNOSIS — Z87891 Personal history of nicotine dependence: Secondary | ICD-10-CM | POA: Diagnosis not present

## 2015-08-24 DIAGNOSIS — R1031 Right lower quadrant pain: Secondary | ICD-10-CM | POA: Diagnosis not present

## 2015-08-24 DIAGNOSIS — R1030 Lower abdominal pain, unspecified: Secondary | ICD-10-CM

## 2015-08-25 ENCOUNTER — Emergency Department (HOSPITAL_COMMUNITY)
Admission: EM | Admit: 2015-08-25 | Discharge: 2015-08-25 | Disposition: A | Discharge status: Home / Self Care | Payer: BLUE CROSS/BLUE SHIELD | Attending: Emergency Medicine | Admitting: Emergency Medicine

## 2015-08-25 ENCOUNTER — Encounter (HOSPITAL_COMMUNITY): Payer: Self-pay | Admitting: Nurse Practitioner

## 2015-08-25 ENCOUNTER — Emergency Department (HOSPITAL_COMMUNITY)
Admission: EM | Admit: 2015-08-25 | Discharge: 2015-08-25 | Disposition: A | Discharge status: Home / Self Care | Payer: BLUE CROSS/BLUE SHIELD | Source: Home / Self Care

## 2015-08-25 ENCOUNTER — Encounter (HOSPITAL_COMMUNITY): Payer: Self-pay | Admitting: Emergency Medicine

## 2015-08-25 DIAGNOSIS — Z79899 Other long term (current) drug therapy: Secondary | ICD-10-CM | POA: Insufficient documentation

## 2015-08-25 DIAGNOSIS — Z87891 Personal history of nicotine dependence: Secondary | ICD-10-CM | POA: Insufficient documentation

## 2015-08-25 DIAGNOSIS — R109 Unspecified abdominal pain: Secondary | ICD-10-CM

## 2015-08-25 DIAGNOSIS — R1031 Right lower quadrant pain: Secondary | ICD-10-CM | POA: Insufficient documentation

## 2015-08-25 LAB — URINALYSIS, ROUTINE W REFLEX MICROSCOPIC
BILIRUBIN URINE: NEGATIVE
BILIRUBIN URINE: NEGATIVE
GLUCOSE, UA: NEGATIVE mg/dL
GLUCOSE, UA: NEGATIVE mg/dL
HGB URINE DIPSTICK: NEGATIVE
HGB URINE DIPSTICK: NEGATIVE
KETONES UR: NEGATIVE mg/dL
KETONES UR: NEGATIVE mg/dL
Leukocytes, UA: NEGATIVE
NITRITE: NEGATIVE
Nitrite: NEGATIVE
PH: 5.5 (ref 5.0–8.0)
PH: 5.5 (ref 5.0–8.0)
Protein, ur: NEGATIVE mg/dL
Protein, ur: NEGATIVE mg/dL
SPECIFIC GRAVITY, URINE: 1.03 (ref 1.005–1.030)
Specific Gravity, Urine: 1.037 — ABNORMAL HIGH (ref 1.005–1.030)

## 2015-08-25 LAB — COMPREHENSIVE METABOLIC PANEL
ALK PHOS: 96 U/L (ref 38–126)
ALT: 31 U/L (ref 14–54)
AST: 31 U/L (ref 15–41)
Albumin: 3.7 g/dL (ref 3.5–5.0)
Anion gap: 10 (ref 5–15)
BILIRUBIN TOTAL: 0.1 mg/dL — AB (ref 0.3–1.2)
BUN: 10 mg/dL (ref 6–20)
CALCIUM: 9.3 mg/dL (ref 8.9–10.3)
CO2: 23 mmol/L (ref 22–32)
CREATININE: 0.72 mg/dL (ref 0.44–1.00)
Chloride: 107 mmol/L (ref 101–111)
GFR calc Af Amer: >60 mL/min (ref >60)
Glucose, Bld: 81 mg/dL (ref 65–99)
Potassium: 3.5 mmol/L (ref 3.5–5.1)
Sodium: 140 mmol/L (ref 135–145)
TOTAL PROTEIN: 7.3 g/dL (ref 6.5–8.1)

## 2015-08-25 LAB — LIPASE, BLOOD: Lipase: 22 U/L (ref 11–51)

## 2015-08-25 LAB — URINE MICROSCOPIC-ADD ON

## 2015-08-25 LAB — CBC
HCT: 33.5 % — ABNORMAL LOW (ref 36.0–46.0)
Hemoglobin: 10.2 g/dL — ABNORMAL LOW (ref 12.0–15.0)
MCH: 23.8 pg — ABNORMAL LOW (ref 26.0–34.0)
MCHC: 30.4 g/dL (ref 30.0–36.0)
MCV: 78.3 fL (ref 78.0–100.0)
Platelets: 330 10*3/uL (ref 150–400)
RBC: 4.28 MIL/uL (ref 3.87–5.11)
RDW: 18.1 % — AB (ref 11.5–15.5)
WBC: 8.6 10*3/uL (ref 4.0–10.5)

## 2015-08-25 LAB — WET PREP, GENITAL
CLUE CELLS WET PREP: NONE SEEN
Sperm: NONE SEEN
Trich, Wet Prep: NONE SEEN
Yeast Wet Prep HPF POC: NONE SEEN

## 2015-08-25 LAB — POC URINE PREG, ED: Preg Test, Ur: NEGATIVE

## 2015-08-25 MED ORDER — ACETAMINOPHEN 325 MG PO TABS
650.0000 mg | ORAL_TABLET | Freq: Once | ORAL | Status: AC
  Administered 2015-08-25: 650 mg via ORAL
  Filled 2015-08-25: qty 2

## 2015-08-25 NOTE — ED Notes (Signed)
C/o lower abd pain, pelvic, and vaginal pain.  States she had a c-section 2 months ago and had to keep foley in for 2 weeks afterwards.  Just finished antibiotic for UTI 4 days ago.

## 2015-08-25 NOTE — ED Notes (Signed)
Called Pt's name. No response. 

## 2015-08-25 NOTE — ED Provider Notes (Signed)
CSN: [id number]     Arrival date & time 08/25/15  1453 History   First MD Initiated Contact with Patient 08/25/15 1520     Chief Complaint  Patient presents with  . Vaginal Pain     (Consider location/radiation/quality/duration/timing/severity/associated sxs/prior Treatment) HPI   Patient is a 25 year old female with a history of C-section in March 2017 who presents to the ED with lower abdominal pain. Patient states after her child was born in March she's had intermittent urinary retention. After her C-section she was self cathing. She states she was treated for a UTI roughly 1 month ago. She states she is no longer cathing herself. She is seeing a urologist for her urinary issues. She states her right lower sided abdominal pain is stinging/sharp pain, that radiates to her back, across to her left lower quadrant and into her left thigh. The pain is a 5/10. She states the pain feels superficial and not deep. She says it has been going over 2 weeks and has been intermittent for the last week has become more constant in the last 3 days it's progressively worsened. She denies nausea, vomiting, fever, chills, dysuria, hematuria, vaginal discharge, vaginal bleeding, changes in her bowels.   Past Medical History  Diagnosis Date  . Complication of anesthesia   . GERD (gastroesophageal reflux disease)     no current meds for reflux  . Kidney stone   . DVT of upper extremity (deep vein thrombosis) [facility])    Past Surgical History  Procedure Laterality Date  . Appendectomy  04/01/2004    ex. lap.  . Knee surgery    . Cystoscopy with retrograde pyelogram, ureteroscopy and stent placement Left 05/05/2014    Procedure: LEFT  URETEROSCOPY,  RETROGRADE PYELOGRAM,  AND STENT PLACEMENT, STONE REMOVAL;  Surgeon: Crist Fat, MD;  Location: [facility];  Service: Urology;  Laterality: Left;  . Holmium laser application Left 05/05/2014    Procedure: HOLMIUM LASER APPLICATION;  Surgeon: Crist Fat, MD;   Location: [facility];  Service: Urology;  Laterality: Left;  . Cesarean section N/A 06/11/2015    Procedure: CESAREAN SECTION;  Surgeon: Marlow Baars, MD;  Location: [facility];  Service: Obstetrics;  Laterality: N/A;   Family History  Problem Relation Age of Onset  . Anesthesia problems Mother     post-op N/V, hard to wake up  . Arthritis Mother    Social History  Substance Use Topics  . Smoking status: Former Smoker    Types: Cigars    Quit date: 09/24/2014  . Smokeless tobacco: Never Used  . Alcohol Use: Yes     Comment: occasional/ not since pregnancy   OB History    Gravida Para Term Preterm AB TAB SAB Ectopic Multiple Living   2 2 2       0 2     Review of Systems  Constitutional: Negative for fever and chills.  Respiratory: Negative for shortness of breath.   Cardiovascular: Negative for chest pain.  Gastrointestinal: Positive for abdominal pain and constipation. Negative for nausea, vomiting, diarrhea, blood in stool and abdominal distention.  Genitourinary: Negative for dysuria, hematuria, vaginal bleeding, vaginal discharge and vaginal pain.  Musculoskeletal: Negative for joint swelling and gait problem.  Skin: Negative for rash.  Neurological: Negative for dizziness, syncope, weakness and headaches.      Allergies  Review of patient's allergies indicates no known allergies.  Home Medications   Prior to Admission medications   Medication Sig Start Date End Date Taking? Authorizing  Provider  acetaminophen (TYLENOL) 325 MG tablet Take 650 mg by mouth every 6 (six) hours as needed for moderate pain or headache.    Yes Historical Provider, MD  folic acid (FOLVITE) 400 MCG tablet Take 400 mcg by mouth at bedtime.   Yes Historical Provider, MD  oxyCODONE (OXY IR/ROXICODONE) 5 MG immediate release tablet Take 5 mg by mouth every 4 (four) hours as needed (for pain scale 4-7).   Yes Historical Provider, MD  phenazopyridine (PYRIDIUM) 200 MG tablet Take 1 tablet (200 mg total) by  mouth 3 (three) times daily. Patient not taking: Reported on 08/25/2015 07/21/15   Duane Lope, NP   BP 122/80 mmHg  Pulse 90  Temp(Src) 97.8 F (36.6 C) (Oral)  Resp 14  SpO2 100%  LMP 08/25/2014  Breastfeeding? Yes Physical Exam  Constitutional: She appears well-developed and well-nourished. No distress.  HENT:  Head: Normocephalic and atraumatic.  Eyes: Conjunctivae are normal.  Cardiovascular: Normal rate.   Pulmonary/Chest: Effort normal and breath sounds normal. No respiratory distress. She has no wheezes. She has no rales.  Abdominal: Normal appearance and bowel sounds are normal. She exhibits no distension. There is no rigidity, no rebound, no guarding and no CVA tenderness.  Mild TTP of the RLQ adn suprapubic region  Genitourinary:  Exam performed by Jerre Simon,  exam chaperoned Date: 08/25/2015 Pelvic exam: normal external genitalia without evidence of trauma. VULVA: normal appearing vulva with no masses, tenderness or lesion. VAGINA: normal appearing vagina with normal color and discharge, no lesions. CERVIX: normal appearing cervix without lesions, cervical motion tenderness absent, cervical os closed with out purulent discharge; vaginal discharge - clear, mucoid and odorless, Wet prep and DNA probe for chlamydia and GC obtained.   ADNEXA: normal adnexa in size, nontender and no masses UTERUS: uterus is normal size, shape, consistency and nontender.    Musculoskeletal: Normal range of motion. She exhibits no edema.  Neurological: She is alert. Coordination normal.  Skin: Skin is warm and dry. No rash noted. She is not diaphoretic.  Psychiatric: She has a normal mood and affect. Her behavior is normal.  Nursing note and vitals reviewed.   ED Course  Procedures (including critical care time) Labs Review Labs Reviewed  WET PREP, GENITAL - Abnormal; Notable for the following:    WBC, Wet Prep HPF POC TOO NUMEROUS TO COUNT (*)    All other components within  normal limits  URINALYSIS, ROUTINE W REFLEX MICROSCOPIC (NOT AT [facility]) - Abnormal; Notable for the following:    Specific Gravity, Urine 1.037 (*)    All other components within normal limits  GC/CHLAMYDIA PROBE AMP ([place]) NOT AT [facility]    Imaging Review No results found. I have personally reviewed and evaluated these images and lab results as part of my medical decision-making.   EKG Interpretation None      MDM   Final diagnoses:  Abdominal pain, unspecified abdominal location   Patient with lower abdominal pain. Patient states the pain is superficial and not deep. Patient's pelvic exam revealed no adnexal or cervical motion tenderness . Patient is nontoxic, nonseptic appearing, in no apparent distress.  Patient's pain adequately managed in emergency department with Tylenol.  Labs and vitals reviewed.  Patient does not meet the SIRS or Sepsis criteria.  On repeat exam patient does not have a surgical abdomin and there are no peritoneal signs.  No indication of appendicitis, bowel obstruction, bowel perforation, cholecystitis, diverticulitis, PID or ectopic pregnancy.  Patient's pregnancy  test was negative and patient without cervical motion tenderness. Because the patient had no tenderness on pelvic exam I do not feel there is a need for further imaging at this time.  Patient afebrile, VSS, well-appearing in no acute distress. Labs done earlier in the day were unremarkable. UA done earlier in the day was negative for UTI. Patient's hemoglobin slightly low although this is close to her baseline.  Patient is being followed by urology for her urinary symptoms. Patient's wet prep revealed WBCs and renal cells. The lab called to inform me of this unusual finding. I informed the patient of this unusual finding and instructed her to follow-up with her OB/GYN, Dr. Chestine Sporelark, to have another wet prep done. I'm unsure what to think of these findings. The patient stated she will call her OB/GYN  Monday morning to be seen next week. Patient was comfortable with this plan.   I instructed the patient to continue Tylenol for pain as she is breast-feeding.  I sent a note to Dr. Chestine Sporelark OB/GYN who is the patient's OB/GYN and performed her recent C-section to inform her of the unusual lab results and that the patient was seen in the ED. I asked Dr. Chestine Sporelark to call the patient to be sure she is seen next week for reevaluation.  I have also discussed reasons to return immediately to the ER.  Patient expresses understanding and agrees with plan.   I discussed this case with Dr. Rhunette CroftNanavati and he agrees with the above plan.    Jerre SimonJessica L Chetara Kropp, PA 08/27/15 0044  Jerre SimonJessica L Willine Schwalbe, PA 08/27/15 [id number]  Derwood KaplanAnkit Nanavati, MD 08/27/15 1520

## 2015-08-25 NOTE — Discharge Instructions (Signed)
Call Dr. Marlou PorchHerrick your urologist and Dr. Chestine Sporelark your OB/GYN on Monday morning to be seen as early as Monday or Tuesday next week. Continue to take Tylenol as needed for pain.  Return to the emergency department if you experience vaginal discharge, vaginal bleeding, blood in your urine, inability to urinate, fever, chills.  Abdominal Pain, Adult Many things can cause abdominal pain. Usually, abdominal pain is not caused by a disease and will improve without treatment. It can often be observed and treated at home. Your health care provider will do a physical exam and possibly order blood tests and X-rays to help determine the seriousness of your pain. However, in many cases, more time must pass before a clear cause of the pain can be found. Before that point, your health care provider may not know if you need more testing or further treatment. HOME CARE INSTRUCTIONS Monitor your abdominal pain for any changes. The following actions may help to alleviate any discomfort you are experiencing:  Only take over-the-counter or prescription medicines as directed by your health care provider.  Do not take laxatives unless directed to do so by your health care provider.  Try a clear liquid diet (broth, tea, or water) as directed by your health care provider. Slowly move to a bland diet as tolerated. SEEK MEDICAL CARE IF:  You have unexplained abdominal pain.  You have abdominal pain associated with nausea or diarrhea.  You have pain when you urinate or have a bowel movement.  You experience abdominal pain that wakes you in the night.  You have abdominal pain that is worsened or improved by eating food.  You have abdominal pain that is worsened with eating fatty foods.  You have a fever. SEEK IMMEDIATE MEDICAL CARE IF:  Your pain does not go away within 2 hours.  You keep throwing up (vomiting).  Your pain is felt only in portions of the abdomen, such as the right side or the left lower portion  of the abdomen.  You pass bloody or black tarry stools. MAKE SURE YOU:  Understand these instructions.  Will watch your condition.  Will get help right away if you are not doing well or get worse.   This information is not intended to replace advice given to you by your health care provider. Make sure you discuss any questions you have with your health care provider.   Document Released: 12/18/2004 Document Revised: 11/29/2014 Document Reviewed: 11/17/2012 Elsevier Interactive Patient Education [employer]2016 [employer] Inc[employer]

## 2015-08-25 NOTE — ED Notes (Addendum)
Pt presents to [facility] for complaints of vaginal pain, was diagnosed with UTI recently and finished antibiotics on Monday but has continued to have lower abdominal pain. She feels she still has a UTI. Complaints of back pain, urinary frequency, and malodorous urine.   [facility] but could not wait for results from blood work and UA that was performed.

## 2015-08-27 LAB — GC/CHLAMYDIA PROBE AMP (~~LOC~~) NOT AT ARMC
CHLAMYDIA, DNA PROBE: NEGATIVE
Neisseria Gonorrhea: NEGATIVE

## 2015-12-11 ENCOUNTER — Other Ambulatory Visit: Payer: Self-pay | Admitting: Physician Assistant

## 2015-12-11 DIAGNOSIS — R1013 Epigastric pain: Secondary | ICD-10-CM

## 2015-12-21 ENCOUNTER — Ambulatory Visit
Admission: RE | Admit: 2015-12-21 | Discharge: 2015-12-21 | Disposition: A | Discharge status: Home / Self Care | Payer: BLUE CROSS/BLUE SHIELD | Source: Ambulatory Visit | Attending: Physician Assistant | Admitting: Physician Assistant

## 2015-12-21 DIAGNOSIS — R1013 Epigastric pain: Secondary | ICD-10-CM

## 2016-01-13 ENCOUNTER — Emergency Department (HOSPITAL_COMMUNITY)
Admission: EM | Admit: 2016-01-13 | Discharge: 2016-01-13 | Disposition: A | Discharge status: Home / Self Care | Payer: BLUE CROSS/BLUE SHIELD | Attending: Emergency Medicine | Admitting: Emergency Medicine

## 2016-01-13 ENCOUNTER — Ambulatory Visit (HOSPITAL_COMMUNITY)
Admission: EM | Admit: 2016-01-13 | Discharge: 2016-01-13 | Disposition: A | Discharge status: Nursing Home (Includes ICF) | Payer: BLUE CROSS/BLUE SHIELD | Attending: Family Medicine | Admitting: Family Medicine

## 2016-01-13 ENCOUNTER — Encounter (HOSPITAL_COMMUNITY): Payer: Self-pay | Admitting: *Deleted

## 2016-01-13 ENCOUNTER — Encounter (HOSPITAL_COMMUNITY): Payer: Self-pay

## 2016-01-13 DIAGNOSIS — R079 Chest pain, unspecified: Secondary | ICD-10-CM | POA: Diagnosis not present

## 2016-01-13 DIAGNOSIS — N61 Mastitis without abscess: Secondary | ICD-10-CM | POA: Insufficient documentation

## 2016-01-13 DIAGNOSIS — Z87891 Personal history of nicotine dependence: Secondary | ICD-10-CM | POA: Insufficient documentation

## 2016-01-13 DIAGNOSIS — N644 Mastodynia: Secondary | ICD-10-CM | POA: Diagnosis present

## 2016-01-13 LAB — CBC
HEMATOCRIT: 37.6 % (ref 36.0–46.0)
HEMOGLOBIN: 12.4 g/dL (ref 12.0–15.0)
MCH: 27.2 pg (ref 26.0–34.0)
MCHC: 33 g/dL (ref 30.0–36.0)
MCV: 82.5 fL (ref 78.0–100.0)
Platelets: 376 10*3/uL (ref 150–400)
RBC: 4.56 MIL/uL (ref 3.87–5.11)
RDW: 13.2 % (ref 11.5–15.5)
WBC: 9.6 10*3/uL (ref 4.0–10.5)

## 2016-01-13 LAB — BASIC METABOLIC PANEL
ANION GAP: 7 (ref 5–15)
BUN: 10 mg/dL (ref 6–20)
CHLORIDE: 108 mmol/L (ref 101–111)
CO2: 23 mmol/L (ref 22–32)
Calcium: 9.3 mg/dL (ref 8.9–10.3)
Creatinine, Ser: 0.57 mg/dL (ref 0.44–1.00)
GFR calc Af Amer: >60 mL/min (ref >60)
GLUCOSE: 82 mg/dL (ref 65–99)
POTASSIUM: 4.4 mmol/L (ref 3.5–5.1)
Sodium: 138 mmol/L (ref 135–145)

## 2016-01-13 LAB — I-STAT TROPONIN, ED: Troponin i, poc: 0 ng/mL (ref 0.00–0.08)

## 2016-01-13 MED ORDER — DICLOXACILLIN SODIUM 500 MG PO CAPS: 500.0000 mg | ORAL_CAPSULE | Freq: Four times a day (QID) | ORAL | 0 refills | Status: AC

## 2016-01-13 MED ORDER — ACETAMINOPHEN 325 MG PO TABS
650.0000 mg | ORAL_TABLET | Freq: Once | ORAL | Status: AC
  Administered 2016-01-13: 650 mg via ORAL
  Filled 2016-01-13: qty 2

## 2016-01-13 NOTE — ED Triage Notes (Signed)
Pt  Reports    Pain  r   Upper   Chest    With    Some    Shortness  Of  Breath     X  sev  Days       Pt  Reports  A  History   Of  Blood  Clot  In  Past      Pt  Ambulated to  Room  With a  Steady  Fluid  Gait

## 2016-01-13 NOTE — ED Notes (Signed)
Report  Phoned  To  Chris  Nurse  first 

## 2016-01-13 NOTE — ED Triage Notes (Signed)
Patient complains of sharp and warm right sided chest pain intermittently x 2 days. States that the pain starts at right breast and radiates up right shoulder. Patient is currently breast feeding and states that her right breast has small bite to nipple from infant. Denies tenderness to touch.

## 2016-01-13 NOTE — ED Provider Notes (Addendum)
[facility]    CSN: [id number] Arrival date & time: 01/13/16  1447     History   Chief Complaint No chief complaint on file.   HPI Erin Jacobson is a 25 y.o. female.   The history is provided by the patient.  Chest Pain  Pain location:  R chest Pain quality: aching and pressure   Pain radiates to:  Upper back Pain severity:  Mild Onset quality:  Sudden Duration:  2 days Progression:  Unchanged Chronicity:  New Relieved by:  None tried Worsened by:  Nothing Ineffective treatments:  None tried Associated symptoms: no claudication, no cough, no fever, no lower extremity edema, no nausea, no palpitations and no shortness of breath     Past Medical History:  Diagnosis Date  . Complication of anesthesia   . DVT of upper extremity (deep vein thrombosis) ([facility])   . GERD (gastroesophageal reflux disease)    no current meds for reflux  . Kidney stone     Patient Active Problem List   Diagnosis Date Noted  . Premature rupture of membranes with onset of labor within 24 hours of rupture 06/11/2015  . DVT (deep vein thrombosis) in pregnancy (HCC) 11/30/2014  . DVT during pregnancy, antepartum (HCC) 11/30/2014  . Ectopic pregnancy with intrauterine pregnancy 11/19/2014  . Ectopic pregnancy 11/18/2014    Past Surgical History:  Procedure Laterality Date  . APPENDECTOMY  04/01/2004   ex. lap.  . CESAREAN SECTION N/A 06/11/2015   Procedure: CESAREAN SECTION;  Surgeon: Marlow Baarsyanna Clark, MD;  Location: [facility];  Service: Obstetrics;  Laterality: N/A;  . CYSTOSCOPY WITH RETROGRADE PYELOGRAM, URETEROSCOPY AND STENT PLACEMENT Left 05/05/2014   Procedure: LEFT  URETEROSCOPY,  RETROGRADE PYELOGRAM,  AND STENT PLACEMENT, STONE REMOVAL;  Surgeon: Crist FatBenjamin W Herrick, MD;  Location: [facility];  Service: Urology;  Laterality: Left;  . HOLMIUM LASER APPLICATION Left 05/05/2014   Procedure: HOLMIUM LASER APPLICATION;  Surgeon: Crist FatBenjamin W Herrick, MD;  Location: [facility];  Service: Urology;   Laterality: Left;  . KNEE SURGERY      OB History    Gravida Para Term Preterm AB Living   2 2 2     2    SAB TAB Ectopic Multiple Live Births         0 2       Home Medications    Prior to Admission medications   Medication Sig Start Date End Date Taking? Authorizing Provider  acetaminophen (TYLENOL) 325 MG tablet Take 650 mg by mouth every 6 (six) hours as needed for moderate pain or headache.     Historical Provider, MD  folic acid (FOLVITE) 400 MCG tablet Take 400 mcg by mouth at bedtime.    Historical Provider, MD  oxyCODONE (OXY IR/ROXICODONE) 5 MG immediate release tablet Take 5 mg by mouth every 4 (four) hours as needed (for pain scale 4-7).    Historical Provider, MD  phenazopyridine (PYRIDIUM) 200 MG tablet Take 1 tablet (200 mg total) by mouth 3 (three) times daily. Patient not taking: Reported on 08/25/2015 07/21/15   Duane LopeJennifer I Rasch, NP    Family History Family History  Problem Relation Age of Onset  . Anesthesia problems Mother     post-op N/V, hard to wake up  . Arthritis Mother     Social History Social History  Substance Use Topics  . Smoking status: Former Smoker    Types: Cigars    Quit date: 09/24/2014  . Smokeless tobacco: Never Used  . Alcohol  use Yes     Comment: occasional/ not since pregnancy     Allergies   Review of patient's allergies indicates no known allergies.   Review of Systems Review of Systems  Constitutional: Negative.  Negative for fever.  HENT: Negative.   Respiratory: Positive for chest tightness. Negative for cough and shortness of breath.   Cardiovascular: Positive for chest pain. Negative for palpitations, claudication and leg swelling.  Gastrointestinal: Negative for nausea.  All other systems reviewed and are negative.    Physical Exam Triage Vital Signs ED Triage Vitals  Enc Vitals Group     BP      Pulse      Resp      Temp      Temp src      SpO2      Weight      Height      Head Circumference      Peak  Flow      Pain Score      Pain Loc      Pain Edu?      Excl. in GC?    No data found.   Updated Vital Signs There were no vitals taken for this visit.  Visual Acuity Right Eye Distance:   Left Eye Distance:   Bilateral Distance:    Right Eye Near:   Left Eye Near:    Bilateral Near:     Physical Exam  Constitutional: She is oriented to person, place, and time. She appears well-developed and well-nourished. No distress.  Neck: Normal range of motion. Neck supple.  Cardiovascular: Regular rhythm, normal heart sounds and intact distal pulses.  Tachycardia present.   Pulmonary/Chest: Effort normal and breath sounds normal.  Musculoskeletal: She exhibits no edema or tenderness.  Neurological: She is alert and oriented to person, place, and time.  Skin: Skin is warm and dry.  Nursing note and vitals reviewed.    UC Treatments / Results  Labs (all labs ordered are listed, but only abnormal results are displayed) Labs Reviewed - No data to display  EKG  EKG Interpretation None       Radiology No results found.  Procedures Procedures (including critical care time)  Medications Ordered in UC Medications - No data to display   Initial Impression / Assessment and Plan / UC Course  I have reviewed the triage vital signs and the nursing notes.  Pertinent labs & imaging results that were available during my care of the patient were reviewed by me and considered in my medical decision making (see chart for details).  Clinical Course    Sent to r/o PE right chest. H/o same 67yr ago.  Final Clinical Impressions(s) / UC Diagnoses   Final diagnoses:  None    New Prescriptions New Prescriptions   No medications on file     Linna Hoff, MD 01/13/16 1636    Linna Hoff, MD 01/13/16 [phone]

## 2016-01-13 NOTE — ED Provider Notes (Signed)
[facility]EMERGENCY DEPT Provider Note   CSN: [id number] Arrival date & time: 01/13/16  1643     History   Chief Complaint Chief Complaint  Patient presents with  . Breast Pain    right    HPI Erin Jacobson is a 25 y.o. female with right breast pain for the last 3-4 days. She has been breast feeding for the past 7 months without issue. 4 days, her child bit her nipple on right. Since then , she has been noting worsening pain, warmth, and mild redness around the areola. She states that the pain radiates from around right nipple up her right breast. She has had no measured fever, chills, drainage from the nipple other than milk. She states that it seems the redness has improved slightly, but the pain is worsening.  She has had some mild nausea, but no vomiting, no abd pain, chest tightness, SOB, pleurisy. No history of prior mastitis.  She has continued breast feeding without significant difficulty other than pain.    HPI  Past Medical History:  Diagnosis Date  . Complication of anesthesia   . DVT of upper extremity (deep vein thrombosis) (HCC)   . GERD (gastroesophageal reflux disease)    no current meds for reflux  . Kidney stone     Patient Active Problem List   Diagnosis Date Noted  . Premature rupture of membranes with onset of labor within 24 hours of rupture 06/11/2015  . DVT (deep vein thrombosis) in pregnancy (HCC) 11/30/2014  . DVT during pregnancy, antepartum (HCC) 11/30/2014  . Ectopic pregnancy with intrauterine pregnancy 11/19/2014  . Ectopic pregnancy 11/18/2014    Past Surgical History:  Procedure Laterality Date  . APPENDECTOMY  04/01/2004   ex. lap.  . CESAREAN SECTION N/A 06/11/2015   Procedure: CESAREAN SECTION;  Surgeon: Marlow Baars, MD;  Location: [facility];  Service: Obstetrics;  Laterality: N/A;  . CYSTOSCOPY WITH RETROGRADE PYELOGRAM, URETEROSCOPY AND STENT PLACEMENT Left 05/05/2014   Procedure: LEFT  URETEROSCOPY,  RETROGRADE PYELOGRAM,  AND STENT PLACEMENT,  STONE REMOVAL;  Surgeon: Crist Fat, MD;  Location: [facility];  Service: Urology;  Laterality: Left;  . HOLMIUM LASER APPLICATION Left 05/05/2014   Procedure: HOLMIUM LASER APPLICATION;  Surgeon: Crist Fat, MD;  Location: [facility];  Service: Urology;  Laterality: Left;  . KNEE SURGERY      OB History    Gravida Para Term Preterm AB Living   2 2 2     2    SAB TAB Ectopic Multiple Live Births         0 2       Home Medications    Prior to Admission medications   Medication Sig Start Date End Date Taking? Authorizing Provider  acetaminophen (TYLENOL) 325 MG tablet Take 650 mg by mouth every 6 (six) hours as needed for moderate pain or headache.     Historical Provider, MD  dicloxacillin (DYNAPEN) 500 MG capsule Take 1 capsule (500 mg total) by mouth 4 (four) times daily. 01/13/16 01/23/16  Francoise Ceo, DO  folic acid (FOLVITE) 400 MCG tablet Take 400 mcg by mouth at bedtime.    Historical Provider, MD  oxyCODONE (OXY IR/ROXICODONE) 5 MG immediate release tablet Take 5 mg by mouth every 4 (four) hours as needed (for pain scale 4-7).    Historical Provider, MD  phenazopyridine (PYRIDIUM) 200 MG tablet Take 1 tablet (200 mg total) by mouth 3 (three) times daily. Patient not taking: Reported on 08/25/2015 07/21/15  Duane LopeJennifer I Rasch, NP    Family History Family History  Problem Relation Age of Onset  . Anesthesia problems Mother     post-op N/V, hard to wake up  . Arthritis Mother     Social History Social History  Substance Use Topics  . Smoking status: Former Smoker    Types: Cigars    Quit date: 09/24/2014  . Smokeless tobacco: Never Used  . Alcohol use Yes     Comment: occasional/ not since pregnancy     Allergies   Review of patient's allergies indicates no known allergies.   Review of Systems Review of Systems  Constitutional: Negative for chills and fever.  HENT: Negative for congestion, rhinorrhea, sinus pressure, sneezing and sore throat.     Respiratory: Negative for cough, chest tightness, shortness of breath and wheezing.   Cardiovascular: Positive for chest pain (breast pain). Negative for palpitations.  Genitourinary: Negative for dysuria, frequency and urgency.  Musculoskeletal: Negative for back pain, neck pain and neck stiffness.  Skin: Positive for rash and wound.  Neurological: Negative for syncope, weakness, numbness and headaches.  All other systems reviewed and are negative.    Physical Exam Updated Vital Signs BP 116/79   Pulse 96   Temp 98.6 F (37 C) (Oral)   Resp 15   Ht 5\' 1"  (1.549 m)   Wt 60.3 kg   LMP 01/01/2016   SpO2 98%   BMI 25.13 kg/m   Physical Exam  Constitutional: She is oriented to person, place, and time. She appears well-developed and well-nourished. No distress.  HENT:  Head: Normocephalic and atraumatic.  Nose: Nose normal.  Mouth/Throat: Oropharynx is clear and moist.  Eyes: Conjunctivae and EOM are normal. Pupils are equal, round, and reactive to light.  Neck: Normal range of motion. Neck supple.  Cardiovascular: Normal rate, regular rhythm, normal heart sounds and intact distal pulses.   Pulmonary/Chest: Effort normal and breath sounds normal. Right breast exhibits skin change (small punctate scab over the nipple with no significant surrounding redness of the nipple or breast) and tenderness. Right breast exhibits no nipple discharge. There is no breast swelling.  Abdominal: Soft. She exhibits no distension. There is no tenderness.  Genitourinary: No breast bleeding.  Musculoskeletal: She exhibits no edema or tenderness.  Neurological: She is alert and oriented to person, place, and time. No cranial nerve deficit. Coordination normal.  Skin: Skin is warm and dry. No rash noted. She is not diaphoretic. No erythema.  Nursing note and vitals reviewed.    ED Treatments / Results  Labs (all labs ordered are listed, but only abnormal results are displayed) Labs Reviewed   BASIC METABOLIC PANEL  CBC  I-STAT TROPOININ, ED    EKG  EKG Interpretation  Date/Time:  Sunday January 13 2016 16:48:12 EDT Ventricular Rate:  90 PR Interval:  168 QRS Duration: 82 QT Interval:  368 QTC Calculation: 450 R Axis:   88 Text Interpretation:  Normal sinus rhythm with sinus arrhythmia Normal ECG No significant change since last tracing Confirmed by LIU MD, DANA ([id number]) on 01/14/2016 9:58:55 AM       Radiology No results found.  Procedures Procedures (including critical care time)  Medications Ordered in ED Medications  acetaminophen (TYLENOL) tablet 650 mg (650 mg Oral Given 01/13/16 1952)     Initial Impression / Assessment and Plan / ED Course  I have reviewed the triage vital signs and the nursing notes.  Pertinent labs & imaging results that were available during my  care of the patient were reviewed by me and considered in my medical decision making (see chart for details).  Clinical Course   25 y.o. female presents with breast pain and concern for mastitis. EKG shows NSR with normal intervals and no evidence of ischemia. Labs were drawn and showed no significant abnormalities. Exam as above. Concerning for early symptoms of mastitis without significant erythema or illness noted currently. Plan to treat with course of dicloxacillin and recommended to follow up closely with her PCP in a few days to monitor for improvement in her sx. This plan was discussed with the patient at the bedside and she stated both understanding and agreement.   Final Clinical Impressions(s) / ED Diagnoses   Final diagnoses:  Mastitis    New Prescriptions Discharge Medication List as of 01/13/2016  7:21 PM    START taking these medications   Details  dicloxacillin (DYNAPEN) 500 MG capsule Take 1 capsule (500 mg total) by mouth 4 (four) times daily., Starting Sun 01/13/2016, Until Wed 01/23/2016, Print         Francoise Ceo, DO 01/14/16 1348    Blane Ohara,  MD 01/19/16 [phone]

## 2016-02-05 ENCOUNTER — Other Ambulatory Visit (HOSPITAL_COMMUNITY): Payer: BLUE CROSS/BLUE SHIELD | Attending: Psychiatry | Admitting: Licensed Clinical Social Worker

## 2016-02-05 DIAGNOSIS — Z915 Personal history of self-harm: Secondary | ICD-10-CM | POA: Insufficient documentation

## 2016-02-05 DIAGNOSIS — F332 Major depressive disorder, recurrent severe without psychotic features: Secondary | ICD-10-CM | POA: Diagnosis present
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
## 2016-02-06 ENCOUNTER — Other Ambulatory Visit (HOSPITAL_COMMUNITY): Payer: BLUE CROSS/BLUE SHIELD

## 2016-02-07 ENCOUNTER — Other Ambulatory Visit (HOSPITAL_COMMUNITY): Payer: BLUE CROSS/BLUE SHIELD | Admitting: Specialist

## 2016-02-07 ENCOUNTER — Other Ambulatory Visit (HOSPITAL_COMMUNITY): Payer: BLUE CROSS/BLUE SHIELD | Admitting: Psychiatry

## 2016-02-07 DIAGNOSIS — F322 Major depressive disorder, single episode, severe without psychotic features: Secondary | ICD-10-CM

## 2016-02-07 DIAGNOSIS — F332 Major depressive disorder, recurrent severe without psychotic features: Secondary | ICD-10-CM | POA: Diagnosis not present

## 2016-02-07 NOTE — Psych (Signed)
11/16 /2107  PHP Admission Note  Duration- 45 minutes   CC- [facility] . Reports she was discharged from an inpatient psychiatric unit earlier this week.   HPI- 25 year old single female. Patient states that over recent months she has been feeling more depressed, feeling overly emotional and hypersensitive to perceived criticisms , and endorses neuro-vegetative symptoms of depression and a tendency to ruminate about stressors. She reports she recently had suicidal ideations, and got into bathtub with thought of drowning self, but did not attempt to do so, states her family was very concerned and she was admitted at [facility], being discharged 2-3 days ago, with recommendation of following up with PHP. She reports she is feeling partially better, less depressed, but still tends to feel sad and continues to endorse some neuro-vegetative symptoms- good sleep, low energy level , mild to moderate sense of anhedonia, a tendency to feel guilty and low sense of self esteem, fair appetite. At this time denies any lingering suicidal ideations. Denies any psychotic symptoms.  She was started on Zoloft and Vistaril PRNs during her recent admission- states she is taking the medication regularly , denies side effects.   Past Psychiatric History- no prior psychiatric admissions, denies prior suicide attempts or any history of self cutting , denies history of psychosis, denies history of mania, denies history of PTSD, reports history of anxiety, and describes it mostly as a tendency to worry excessively and also chronic symptoms of social anxiety, with severe subjective anxiety if having to speak in front of groups or strangers.   Current Medications- Vistaril (  Q 6 hours PRN for anxiety- unsure of dose), Zoloft ( 100mg rs QDAY)   Substance Abuse History- denies drug or alcohol abuse   Medical History - no medical illnesses, smokes about 5  cigarettes per week.  Social History- she is single, has two children ages 6 years and 7 months ,lives with mother who is supportive and helps her take care of children, she is currently unemployed, no SO at this time, denies legal issues . Reports financial stressors and chronic stressor of limited/no involvement of children's fathers in their care .   Family History- parents alive, separated[place], patient lives with mother, 2 sisters, no mental illness in family, no suicides in family, uncles have history of substance abuse, alcohol abuse.  ROS - at this time denies headache, denies chest pain, denies shortness of breath, no nausea, no vomiting, no diarrhea, denies pain, myalgias, or arthralgias, denies rash, no fever, no chills, no history of seizures.  MSE- today presents alert, attentive, well groomed, fair  eye contact, slightly anxious, but improved as session progressed, no psychomotor agitation or retardation, speech normal in rate, soft in volume, mood described as partially  Improved but still depressed, and affect presents constricted, sad, but reactive, does smile appropriately at times. No thought disorder, denies suicidal ideations, denies self injurious ideations, contracts for safety and states she would inform staff if such thoughts should re-emerge,  denies homicidal ideations, no hallucinations, no delusions, not internally preoccupied . Judgment and Insight intact . 0x3 .  Assessment - 25 year old single female, lives with mother and her two sons, 7 months post partum, reports depression x several months, recent psychiatric admission due to depression and suicidal ideations. No psychotic symptoms. In addition to depression also reports significant anxiety and chronic symptoms of social phobia.No prior history of severe depression or of  psychiatric admissions /treatment . She has been facing significant stressors, her children's fathers are not involved in their care, she  has limited support other than her mother, and is currently unemployed .  She was recently started on Zoloft and Vistaril PRNs. ( about one week ago) . Thus far no side effects. States Zoloft is first psychiatric medication/antidepressant she has been on. At this time remains depressed, but improved and no current suicidal ideations.  Diagnosis- MDD, Single Episode, Severe, without psychotic features                    Social Phobia  Plan- Admit to PHP , with goal of working on improving mood and affect further, decreasing chronic anxiety, and minimizing risk of decompensation or need for readmission.  Continue Zoloft/VIstaril PRNs as above- patient to bring bottles to document, confirm Vistaril doses. Side effects, to include risk of sedation on Vistaril discussed. Does not need scripts at this time. If patient provides written consent, will [facility] to review labs and records   Nehemiah MassedFernando Sena Hoopingarner, MD

## 2016-02-07 NOTE — Therapy (Signed)
[facility] [address] [place], [place], [address] Phone: [phone]   Fax:  [phone]  Occupational Therapy Evaluation  Patient Details  Name: Erin Jacobson MRN: [id number] Date of Birth: 07/03/1990 No Data Recorded  Encounter Date: 02/07/2016      OT End of Session - 02/07/16 1537    Visit Number 1   Number of Visits 6   Date for OT Re-Evaluation 03/08/16   OT Start Time 1030   OT Stop Time 1145   OT Time Calculation (min) 75 min   Activity Tolerance Patient tolerated treatment well   Behavior During Therapy [facility] for tasks assessed/performed      Past Medical History:  Diagnosis Date  . Complication of anesthesia   . DVT of upper extremity (deep vein thrombosis) (HCC)   . GERD (gastroesophageal reflux disease)    no current meds for reflux  . Kidney stone     Past Surgical History:  Procedure Laterality Date  . APPENDECTOMY  04/01/2004   ex. lap.  . CESAREAN SECTION N/A 06/11/2015   Procedure: CESAREAN SECTION;  Surgeon: Marlow Baarsyanna Clark, MD;  Location: [facility];  Service: Obstetrics;  Laterality: N/A;  . CYSTOSCOPY WITH RETROGRADE PYELOGRAM, URETEROSCOPY AND STENT PLACEMENT Left 05/05/2014   Procedure: LEFT  URETEROSCOPY,  RETROGRADE PYELOGRAM,  AND STENT PLACEMENT, STONE REMOVAL;  Surgeon: Crist FatBenjamin W Herrick, MD;  Location: [facility];  Service: Urology;  Laterality: Left;  . HOLMIUM LASER APPLICATION Left 05/05/2014   Procedure: HOLMIUM LASER APPLICATION;  Surgeon: Crist FatBenjamin W Herrick, MD;  Location: [facility];  Service: Urology;  Laterality: Left;  . KNEE SURGERY      There were no vitals filed for this visit.      Subjective Assessment - 02/07/16 1536    Currently in Pain? 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 Leisure:  No leisure activities Social support:  None - does not trust anyone to tell her true feelings Struggles:  Too critical of herself OT goal:  Find social support General Causality Orientation Scale   Subscore Percentile Score  Autonomy 52 45.61  Control 37 11.05  Impersonal 67 97.17   Motivation Type  Motivation type Explanation  o Impersonal/amotivational There is a lack of connection between any of the individuals behavior and his or her personal goals.  The individual is likely in a passivity state or manifests non-goal-directed behavior.  This is considered a type of motivational deficit and warrants motivational interventions.  Assessment:  Patient demonstrates impersonal/amotivational motivation type.  Patient will benefit from occupational therapy intervention in order to improve time management, financial management, stress management, job readiness skills, social skills,sleep hygiene, exercise and healthy eating habits,  and health management skills and other psychosocial skills needed for preparation to return to full time community living and to be a productive community member.   Plan:  Patient will participate in skilled occupational therapy sessions individually or in a group setting to improve coping skills, psychosocial skills, and emotional skills required to return to prior level of function as a productive community member. Treatment will be 1-2 times per week for 2-6 weeks.  OT Education - 02/07/16 1536    Education provided Yes   Education Details time management skills   Person(s) Educated Patient   Methods Explanation;Handout   Comprehension Verbalized understanding          OT Short Term Goals - 02/07/16 1538      OT SHORT TERM GOAL #1   Title Patient will be educated on strategies to improve psychosocial skills needed to participate fully in all daily, work,  and leisure activities.   Time 3   Period Weeks   Status New     OT SHORT TERM GOAL #2   Title Patient will be educated on a HEP and independent with implementation of HEP.   Time 3   Period Weeks   Status New     OT SHORT TERM GOAL #3   Title Patient will independently apply psychosocial skills and coping mechanisms to her daily actiivties in order to function independently   Time 3   Period Weeks   Status New          S:  I really don't do anything with my time, I just sit and ponder life. O:  Patient actively participated in the following skilled occupational therapy group this date: o Time management  Patient required facilitation from occupational therapist moderate to remain focused and engaged in group. A:  Patient participated in skilled occupational therapy group for time management skills this date.  Patient required cuing to engage in group. P:  Continue participation in skilled occupational therapy groups  1-2 times per week for 3 weeks in order to gain the necessary skills needed to return to full time community living and learn effective coping strategies to be a productive community resident.            Plan - 02/07/16 1537    Rehab Potential Good   OT Frequency 2x / week   OT Duration --  3 weeks   OT Treatment/Interventions Self-care/ADL training  coping strategies, psychosocial skills   Consulted and Agree with Plan of Care Patient      Patient will benefit from skilled therapeutic intervention in order to improve the following deficits and impairments:  Other (comment) (decreased coping skills, decreased psychosocial skills)  Visit Diagnosis: MDD (major depressive disorder), single episode, severe , no psychosis (HCC)    Problem List Patient Active Problem List   Diagnosis Date Noted  . Premature rupture of membranes with onset of labor within 24 hours of rupture 06/11/2015  . DVT (deep vein thrombosis) in pregnancy (HCC) 11/30/2014  .  DVT during pregnancy, antepartum (HCC) 11/30/2014  . Ectopic pregnancy with intrauterine pregnancy 11/19/2014  . Ectopic pregnancy 11/18/2014    Shirlean MylarBethany H. Rathana Viveros, MHA, OTR/L [phone]  02/07/2016, 3:40 PM  [facility] [address] [place] [place], [place], [address] Phone: [phone]   Fax:  [phone]  Name: Erin Prontoene Calvin MRN: [id number] Date of Birth: 07/28/1990

## 2016-02-07 NOTE — Psych (Signed)
   [facility]  Erin Prontoene Mcginniss [id number]  Session Time: 9 AM - 1 PM  Participation Level: Minimal  Behavioral Response: CasualAlertDepressed  Type of Therapy: Group Therapy  Treatment Goals addressed: Coping; Depression  Interventions: CBT, DBT, Supportive and Reframing  Summary: Clinician facilitated check-in regarding current stressors and situation, and review of patient completed diary card. Clinician utilized active listening and empathetic response and validated patient emotions. Clinician facilitated discussion on offering ourselves grace, honoring  feelings, and finding joy. Clinician continued topic of cognitive distortions. Clinician introduced topic of happiness and what it entails.  Clinician assessed for immediate needs, medication compliance and efficacy, and safety concerns.    Suicidal/Homicidal: Nowithout intent/plan  Therapist Response: Erin Jacobson is a 25 y.o. female who presents with depression symptoms. Patient arrived within time allowed and reports she is feeling "okay." Patient rates her mood at a 5 on a 1- 10 scale with 10 being great. Patient engaged in activity and discussion.Patient engaged in group activity and discussion. Patient was able to state ways in which to reframe the idea of happiness. Patient demonstrates some progress as evidenced by engagement in group. This is patient's first session of group  And she was reserved however engaged AEB facial expression. Patient denies SI/HI/self-harm thoughts   Plan: Patient will continue in PHP and medication management. Work towards decreasing depression symptoms and increase emotional regulation and positive coping skills.    Diagnosis: MDD (major depressive disorder), single episode, severe , no psychosis (HCC) [F32.2]    1. MDD (major depressive disorder), single episode, severe , no psychosis (HCC)       Donia GuilesJenny Brendyn Mclaren, LCSW 02/07/2016

## 2016-02-08 ENCOUNTER — Other Ambulatory Visit (HOSPITAL_COMMUNITY): Payer: Self-pay

## 2016-02-11 ENCOUNTER — Other Ambulatory Visit (HOSPITAL_COMMUNITY): Payer: BLUE CROSS/BLUE SHIELD | Admitting: Licensed Clinical Social Worker

## 2016-02-11 DIAGNOSIS — F322 Major depressive disorder, single episode, severe without psychotic features: Secondary | ICD-10-CM

## 2016-02-11 DIAGNOSIS — F332 Major depressive disorder, recurrent severe without psychotic features: Secondary | ICD-10-CM | POA: Diagnosis not present

## 2016-02-12 ENCOUNTER — Other Ambulatory Visit (HOSPITAL_COMMUNITY): Payer: BLUE CROSS/BLUE SHIELD | Admitting: Licensed Clinical Social Worker

## 2016-02-12 ENCOUNTER — Other Ambulatory Visit (HOSPITAL_COMMUNITY): Payer: BLUE CROSS/BLUE SHIELD | Admitting: Specialist

## 2016-02-12 DIAGNOSIS — F322 Major depressive disorder, single episode, severe without psychotic features: Secondary | ICD-10-CM

## 2016-02-12 DIAGNOSIS — F332 Major depressive disorder, recurrent severe without psychotic features: Secondary | ICD-10-CM | POA: Diagnosis not present

## 2016-02-12 NOTE — Therapy (Signed)
[facility] [address] [place], [place], [address] Phone: [phone]   Fax:  [phone]  Occupational Therapy Treatment  Patient Details  Name: Erin Jacobson MRN: [id number] Date of Birth: 10/28/1990 No Data Recorded  Encounter Date: 02/12/2016      OT End of Session - 02/12/16 2150    Visit Number 2   Number of Visits 6   Date for OT Re-Evaluation 03/08/16   Authorization Type [employer]   OT Start Time 1030   OT Stop Time 1130   OT Time Calculation (min) 60 min   Activity Tolerance Patient tolerated treatment well   Behavior During Therapy [facility] for tasks assessed/performed      Past Medical History:  Diagnosis Date  . Complication of anesthesia   . DVT of upper extremity (deep vein thrombosis) (HCC)   . GERD (gastroesophageal reflux disease)    no current meds for reflux  . Kidney stone     Past Surgical History:  Procedure Laterality Date  . APPENDECTOMY  04/01/2004   ex. lap.  . CESAREAN SECTION N/A 06/11/2015   Procedure: CESAREAN SECTION;  Surgeon: Marlow Baarsyanna Clark, MD;  Location: [facility];  Service: Obstetrics;  Laterality: N/A;  . CYSTOSCOPY WITH RETROGRADE PYELOGRAM, URETEROSCOPY AND STENT PLACEMENT Left 05/05/2014   Procedure: LEFT  URETEROSCOPY,  RETROGRADE PYELOGRAM,  AND STENT PLACEMENT, STONE REMOVAL;  Surgeon: Crist FatBenjamin W Herrick, MD;  Location[facility];  Service: Urology;  Laterality: Left;  . HOLMIUM LASER APPLICATION Left 05/05/2014   Procedure: HOLMIUM LASER APPLICATION;  Surgeon: Crist FatBenjamin W Herrick, MD;  Location[facility];  Service: Urology;  Laterality: Left;  . KNEE SURGERY      There were no vitals filed for this visit.      Subjective Assessment - 02/12/16 2149    Currently in Pain? No/denies        S:  I try to think whats the worst that could happen and rationalize when I feel stressed.  O:  Patient actively participated in the following skilled occupational therapy group this date: o Stress  management- relaxation techniques, emotional regulation techniques.  Patient required facilitation from occupational therapist (min) to remain focused and engaged in group. A:  Patient participated in skilled occupational therapy group for stress management skills this date.  Patient was min-mod engaged. P:  Continue participation in skilled occupational therapy groups  1-2 times per week for 3 weeks in order to gain the necessary skills needed to return to full time community living and learn effective coping strategies to be a productive community resident.                          OT Education - 02/12/16 2149    Education provided Yes   Education Details stress management skills   Person(s) Educated Patient   Methods Explanation;Handout   Comprehension Verbalized understanding          OT Short Term Goals - 02/12/16 2151      OT SHORT TERM GOAL #1   Title Patient will be educated on strategies to improve psychosocial skills needed to participate fully in all daily, work, and leisure activities.   Time 3   Period Weeks   Status On-going     OT SHORT TERM GOAL #2   Title Patient will be educated on a HEP and independent with implementation of HEP.   Time 3   Period Weeks   Status  On-going     OT SHORT TERM GOAL #3   Title Patient will independently apply psychosocial skills and coping mechanisms to her daily actiivties in order to function independently   Time 3   Period Weeks   Status On-going                  Plan - 02/12/16 2150    OT Frequency 2x / week   OT Treatment/Interventions Self-care/ADL training      Patient will benefit from skilled therapeutic intervention in order to improve the following deficits and impairments:   (decreased coping strategies, decreased pyschosocial skills)  Visit Diagnosis: MDD (major depressive disorder), single episode, severe , no psychosis (HCC)    Problem List Patient Active Problem List    Diagnosis Date Noted  . Premature rupture of membranes with onset of labor within 24 hours of rupture 06/11/2015  . DVT (deep vein thrombosis) in pregnancy (HCC) 11/30/2014  . DVT during pregnancy, antepartum (HCC) 11/30/2014  . Ectopic pregnancy with intrauterine pregnancy 11/19/2014  . Ectopic pregnancy 11/18/2014    Shirlean MylarBethany H. Anahit Klumb, MHA, OTR/L [phone]  02/12/2016, 9:52 PM  [facility] [address] [place], [place], [address] Phone: [phone]   Fax:  [phone]  Name: Erin Jacobson MRN: [id number] Date of Birth: 10/10/1990

## 2016-02-13 ENCOUNTER — Other Ambulatory Visit (HOSPITAL_COMMUNITY): Payer: Self-pay

## 2016-02-13 NOTE — Psych (Signed)
   [facility]  Erin Prontoene Mario [id number]  Session Time: 9 AM - 2 PM  Participation Level: Active  Behavioral Response: CasualAlertDepressed  Type of Therapy: Group Therapy  Treatment Goals addressed: Coping; Depression  Interventions: CBT, DBT, Supportive and Reframing  Summary: Clinician facilitated check-in regarding current stressors and situation, and review of patient completed diary card. Clinician utilized active listening and empathetic response and validated patient emotions. Clinician facilitated check-in regarding current stressors and situation, and review of patient completed diary card. Clinician utilized active listening and empathetic response and validated patient emotions. Clinician facilitated discussion on hobbies, relationship stress, and coping skills. Clinician introduced topic of healthy relationships. Clinician led activity to determine ideal traits in a relationship and to differentiate healthy/unhealthy relationship characteristics. Clinician assessed for immediate needs, medication compliance and efficacy, and safety concerns.   Suicidal/Homicidal: Nowithout intent/plan  Therapist Response: Erin Jacobson is a 25 y.o. female who presents with depression symptoms. Patient arrived within time allowed and reports she is feeling "great." Patient rates her mood at a 8 on a 1- 10 scale with 10 being great. Patient engaged in activity and discussion.Patient engaged in group activity and discussion. Patient identified what she wants in an ideal relationship and was able to accurately recognize unhealthy relationship traits.Patient demonstrates some progress as evidenced by increased engagement and sharing in group as well as report of increased mood. Patient denies SI/HI/self-harm thoughts   Plan: Patient will continue in PHP and medication management. Work towards decreasing depression symptoms and increase emotional regulation and positive coping skills.     Diagnosis: MDD (major depressive disorder), single episode, severe , no psychosis (HCC) [F32.2]    1. MDD (major depressive disorder), single episode, severe , no psychosis (HCC)       Erin GuilesJenny Quamesha Mullet, LCSW 02/13/2016

## 2016-02-13 NOTE — Psych (Signed)
   [facility]  Erin Jacobson [id number]  Session Time: 9 AM - 2 PM  Participation Level: Active  Behavioral Response: CasualAlertDepressed  Type of Therapy: Group Therapy  Treatment Goals addressed: Coping; Depression  Interventions: CBT, DBT, Supportive and Reframing  Summary: Clinician facilitated check-in regarding current stressors and situation, and review of patient completed diary card. Clinician utilized active listening and empathetic response and validated patient emotions. Clinician facilitated discussion on anxiety and how to manage it as well as relationship stress. Clinician introduced topic of the holidays and particular stresses that may arise and how to deal with them. Clinician assessed for immediate needs, medication compliance and efficacy, and safety concerns.   Suicidal/Homicidal: Nowithout intent/plan  Therapist Response: Erin Prontoene Piggee is a 25 y.o. female who presents with depression symptoms. Patient arrived within time allowed and reports she is feeling "tired." Patient rates her mood at a 6 on a 1- 10 scale with 10 being great. Patient states she had new job orientation yesterday and is feeling positive however it took a lot out of her physically. Patient engaged in activity and discussion. Patient was able to identify issues which may be problematic and how to address them. Patient demonstrates some progress as evidenced by increased vocal participation and further animation in tone and facial expression. Patient denies SI/HI/self-harm thoughts   Plan: Patient will continue in PHP and medication management. Work towards decreasing depression symptoms and increase emotional regulation and positive coping skills.    Diagnosis: MDD (major depressive disorder), single episode, severe , no psychosis (HCC) [F32.2]    1. MDD (major depressive disorder), single episode, severe , no psychosis (HCC)       Donia GuilesJenny Cedrik Heindl, LCSW 02/13/2016

## 2016-02-18 ENCOUNTER — Other Ambulatory Visit (HOSPITAL_COMMUNITY): Payer: BLUE CROSS/BLUE SHIELD | Admitting: Licensed Clinical Social Worker

## 2016-02-18 DIAGNOSIS — F322 Major depressive disorder, single episode, severe without psychotic features: Secondary | ICD-10-CM

## 2016-02-18 DIAGNOSIS — F332 Major depressive disorder, recurrent severe without psychotic features: Secondary | ICD-10-CM | POA: Diagnosis not present

## 2016-02-19 ENCOUNTER — Other Ambulatory Visit (HOSPITAL_COMMUNITY): Payer: BLUE CROSS/BLUE SHIELD | Admitting: Licensed Clinical Social Worker

## 2016-02-19 ENCOUNTER — Other Ambulatory Visit (HOSPITAL_COMMUNITY): Payer: BLUE CROSS/BLUE SHIELD

## 2016-02-19 NOTE — Psych (Signed)
   [facility]  Erin Prontoene Califf [id number]  Session Time: 9 AM - 2 PM  Participation Level: Active  Behavioral Response: CasualAlertDepressed  Type of Therapy: Group Therapy  Treatment Goals addressed: Coping; Depression  Interventions: CBT, DBT, Supportive and Reframing  Summary: Clinician facilitated check-in regarding current stressors and situation, and review of patient completed diary card. Clinician utilized active listening and empathetic response and validated patient emotions. Clinician facilitated discussion on the holiday break and how it went. Group discussed ways to increase socialability and positive activities into their every day. Clinician introduced topic of friendships and self esteem.Clinician assessed for immediate needs, medication compliance and efficacy, and safety concerns.   Suicidal/Homicidal: Nowithout intent/plan  Therapist Response: Erin Jacobson is a 25 y.o. female who presents with depression symptoms. Patient arrived within time allowed and reports she is feeling "tired." Patient rates her mood at a 8 on a 1- 10 scale with 10 being great.  Patient shared experiences from the break and how she managed. Patient brainstormed ways to reach out and join new social groups. Patient verbalized ways in which her self esteem effected her relationships. Patient demonstrates some progress as evidenced by opening up to group about a relationship issue, however patient did not speak until encouraged by clinician.  Patient denies SI/HI/self-harm thoughts   Plan: Patient will continue in PHP and medication management. Work towards decreasing depression symptoms and increase emotional regulation and positive coping skills.    Diagnosis: MDD (major depressive disorder), single episode, severe , no psychosis (HCC) [F32.2]    1. MDD (major depressive disorder), single episode, severe , no psychosis (HCC)       Donia GuilesJenny Vernice Bowker, LCSW 02/19/2016

## 2016-02-20 ENCOUNTER — Other Ambulatory Visit (HOSPITAL_COMMUNITY): Payer: Self-pay

## 2016-02-21 ENCOUNTER — Encounter (HOSPITAL_COMMUNITY): Payer: Self-pay | Admitting: Occupational Therapy

## 2016-02-21 ENCOUNTER — Other Ambulatory Visit (HOSPITAL_COMMUNITY): Payer: BLUE CROSS/BLUE SHIELD | Admitting: Occupational Therapy

## 2016-02-21 ENCOUNTER — Other Ambulatory Visit (HOSPITAL_COMMUNITY): Payer: BLUE CROSS/BLUE SHIELD | Admitting: Licensed Clinical Social Worker

## 2016-02-21 DIAGNOSIS — F332 Major depressive disorder, recurrent severe without psychotic features: Secondary | ICD-10-CM

## 2016-02-21 DIAGNOSIS — F322 Major depressive disorder, single episode, severe without psychotic features: Secondary | ICD-10-CM

## 2016-02-21 NOTE — Progress Notes (Signed)
Ms Erin Jacobson is depressed since her second son was born 8 months ago.  Cannot seem to shake it.  Made a suicidal attempt which resulted in inpatient stay.  She is currently in the partial hospital program but cannot identify with the peers in the group.  She is still depressed and has some suicidal thoughts but no intent and wants to not have the thoughts.  She missed 2 doses of her antidepressant and felt much worse so she will not do that again, she said.  Her mother is helping a lot with the children and supporting her as well.  The older son's father is not involved but the younger son's father is involved.no history of abuse or trauma.   Plan is to transfer to IOP which should meet her needs better Diagnosis:  Major depression, recurrent, severe without psychotic features

## 2016-02-21 NOTE — Psych (Signed)
  [facility]  Erin Jacobson [id number]  Admission date: 02/05/2016 Discharge date: 02/21/2016  Reason for admission: depression  Progress in Program Toward Treatment Goals: minimal progress  Progress (rationale): having trouble identifying with the peer group and reluctant to take medication but does want help  [facility]    [name], MD 02/21/2016

## 2016-02-21 NOTE — Therapy (Signed)
[facility] [address] [place], [place], [address] Phone: [phone]   Fax:  [phone]  Occupational Therapy Treatment  Patient Details  Name: Erin Jacobson MRN: [id number] Date of Birth: 08/21/1990 No Data Recorded  Encounter Date: 02/21/2016      OT End of Session - 02/21/16 1238    Visit Number 3   Number of Visits 6   Date for OT Re-Evaluation 03/08/16   Authorization Type BC[employer]   OT Start Time 1030   OT Stop Time 1135   OT Time Calculation (min) 65 min   Activity Tolerance Patient tolerated treatment well   Behavior [facility] Therapy [facility] for tasks assessed/performed      Past Medical History:  Diagnosis Date  . Complication of anesthesia   . DVT of upper extremity (deep vein thrombosis) (HCC)   . GERD (gastroesophageal reflux disease)    no current meds for reflux  . Kidney stone     Past Surgical History:  Procedure Laterality Date  . APPENDECTOMY  04/01/2004   ex. lap.  . CESAREAN SECTION N/A 06/11/2015   Procedure: CESAREAN SECTION;  Surgeon: Marlow Baarsyanna Clark, MD;  Location: [facility];  Service: Obstetrics;  Laterality: N/A;  . CYSTOSCOPY WITH RETROGRADE PYELOGRAM, URETEROSCOPY AND STENT PLACEMENT Left 05/05/2014   Procedure: LEFT  URETEROSCOPY,  RETROGRADE PYELOGRAM,  AND STENT PLACEMENT, STONE REMOVAL;  Surgeon: Crist FatBenjamin W Herrick, MD;  Location: [facility];  Service: Urology;  Laterality: Left;  . HOLMIUM LASER APPLICATION Left 05/05/2014   Procedure: HOLMIUM LASER APPLICATION;  Surgeon: Crist FatBenjamin W Herrick, MD;  Location: [facility];  Service: Urology;  Laterality: Left;  . KNEE SURGERY      There were no vitals filed for this visit.      Subjective Assessment - 02/21/16 1238    Currently in Pain? No/denies      OT Group: Social and Manufacturing systems engineerCommunication Skills   S:  "I don't really have any good traits to write."  O:  Patient actively participated in the following skilled occupational therapy treatment session this  date:             Social and communication skills: [name] participated in discussion of social and communication skills including the importance of social skills, what she is good at and what she needs to improve on. [name] engaged in discussion on conversation starters identifying more than 5 potential ideas for beginning a conversation. Also engaged in discussion on body language and what is portrayed via various gestures and postures. [name] completed self-esteem alphabet worksheet identifying good qualities about herself; this task was difficult for [name] as she expressed she didn't feel that she had any positive traits so she wrote goals for herself. Also discussed variables that affect self-esteem. Finally engaged in discussion of assertiveness including confident behavior and reviewed the traits of passive, assertive, and aggressive individuals.   A:  Patient participated in skilled occupational therapy group for social and communication skills this date.  Patient was minimally engaged and open to ideas and strategies introduced.  Pt feels confident in her communication skills, however feels she has low self-esteem and does desire to improve her communication skills for future interactions.    P:  Continue participation in skilled occupational therapy groups  1-2 times per week for 2 weeks in order to gain the necessary skills needed to return to full time community living and learn effective coping strategies to be a productive community resident. Follow up on HEP social  and communication skills.             OT Short Term Goals - 02/12/16 2151      OT SHORT TERM GOAL #1   Title Patient will be educated on strategies to improve psychosocial skills needed to participate fully in all daily, work, and leisure activities.   Time 3   Period Weeks   Status On-going     OT SHORT TERM GOAL #2   Title Patient will be educated on a HEP and independent with implementation of HEP.   Time 3   Period  Weeks   Status On-going     OT SHORT TERM GOAL #3   Title Patient will independently apply psychosocial skills and coping mechanisms to her daily actiivties in order to function independently   Time 3   Period Weeks   Status On-going                  Plan - 02/21/16 1238    OT Frequency 2x / week   OT Treatment/Interventions Self-care/ADL training  coping skills mechanism training, psychosocial skill development, community reintegration   Consulted and Agree with Plan of Care Patient      Patient will benefit from skilled therapeutic intervention in order to improve the following deficits and impairments:   (decreased coping strategies, decreased pyschosocial skills)  Visit Diagnosis: MDD (major depressive disorder), single episode, severe , no psychosis (HCC)    Problem List Patient Active Problem List   Diagnosis Date Noted  . Premature rupture of membranes with onset of labor within 24 hours of rupture 06/11/2015  . DVT (deep vein thrombosis) in pregnancy (HCC) 11/30/2014  . DVT during pregnancy, antepartum (HCC) 11/30/2014  . Ectopic pregnancy with intrauterine pregnancy 11/19/2014  . Ectopic pregnancy 11/18/2014   Ezra SitesLeslie Troxler, OTR/L  [phone] 02/21/2016, 12:39 PM  [facility] [address] [place], [place], [address] Phone: [phone]   Fax:  [phone]  Name: Erin Jacobson MRN: [id number] Date of Birth: 09/30/1990

## 2016-02-22 ENCOUNTER — Other Ambulatory Visit (HOSPITAL_COMMUNITY): Payer: BLUE CROSS/BLUE SHIELD | Attending: Psychiatry | Admitting: Psychiatry

## 2016-02-22 DIAGNOSIS — F332 Major depressive disorder, recurrent severe without psychotic features: Secondary | ICD-10-CM | POA: Diagnosis present

## 2016-02-22 DIAGNOSIS — F322 Major depressive disorder, single episode, severe without psychotic features: Secondary | ICD-10-CM

## 2016-02-22 NOTE — Progress Notes (Signed)
Erin Jacobson is a 25 y.o., single, unemployed, African American female who [facility]  [facility] (attempted to drown herself, but was stopped by her mother).  Pt currently denies SI, but admits to having the thoughts a couple of days ago.  States she became overwhelmed, but didn't do anything due to her kids.  Discussed safety options with pt at length.  Pt able to contract for safety. Pt denies drugs/ETOH. CC:  Previous PHP chart/notes for hx A:  Oriented pt.  Will refer pt to a therapist and psychiatrist.  Encouraged support groups.  R:  Pt receptive.        Chestine SporeLARK, RITA, M.Ed, CNA

## 2016-02-22 NOTE — Progress Notes (Signed)
    Daily Group Progress Note  Program: IOP  Group Time: 0900-1045  Participation Level: Minimal  Behavioral Response: Appropriate  Type of Therapy:  Group Therapy  Summary of Progress:  Pt hadn't arrived during the first part of group.  Thayer Ohmhris, RN finished the group up by talking about [employer].  She discussed how each pt has a choice within them to choose their recovery route.       Group Time: 1100-1200  Participation Level:  Minimal  Behavioral Response: Appropriate  Type of Therapy: Psycho-education Group  Summary of Progress:  [facility] provided pts with resource information and answered questions.   Chestine SporeLARK, RITA, M.Ed, CNA

## 2016-02-22 NOTE — Psych (Signed)
   [facility]  Erin Jacobson [id number]  Session Time: 9 AM - 2 PM  Participation Level: Active  Behavioral Response: CasualAlertDepressed  Type of Therapy: Group Therapy  Treatment Goals addressed: Coping; Depression  Interventions: CBT, DBT, Supportive and Reframing  Summary: Clinician facilitated check-in regarding current stressors and situation, and review of patient completed diary card. Clinician utilized active listening and empathetic response and validated patient emotions. Clinician facilitated discussion on self care, purpose, and creativity as an outlet. Clinician continued topic of needs and group completed second half of needs assessment and discussed ways to meet needs which could be improved upon. Clinician led a communication role play regarding starting conversations. Clinician assessed for immediate needs, medication compliance and efficacy, and safety concerns.   Suicidal/Homicidal: Nowithout intent/plan  Therapist Response: Erin Jacobson is a 25 y.o. female who presents with depression symptoms. Patient arrived within time allowed and reports she is feeling "tired." Patient rates her mood at a 2 on a 1- 10 scale with 10 being great and stated having SI last night, however did not attempt because of her children.  Patient had minimal participation. Patient demonstrates some progress as evidenced by attending group and being honest with clinician regarding her needs and current situation.  Patient denies SI/HI/self-harm thoughts   Plan: Patient identifies that she has not been coming to treatment due to feeling it is not meeting her needs as she cannot relate to others in the group, however does feel like she needs help and wants it. After consultation with patient and psychiatrist, patient will discharge from PHP and step-down to IOP, which may better suit her needs at this time. Patient agreed to plan and will begin IOP tomorrow, 02/22/16. Clinician  provided patient with information about new mom support groups and encouraged her to attend those as well.    Diagnosis: Severe recurrent major depression without psychotic features (HCC) [F33.2]    1. Severe recurrent major depression without psychotic features [facility](HCC)       Donia GuilesJenny Harpreet Pompey, LCSW 02/22/2016

## 2016-02-25 ENCOUNTER — Other Ambulatory Visit (HOSPITAL_COMMUNITY): Payer: BLUE CROSS/BLUE SHIELD

## 2016-02-25 ENCOUNTER — Other Ambulatory Visit (HOSPITAL_COMMUNITY): Payer: Self-pay

## 2016-02-26 ENCOUNTER — Other Ambulatory Visit (HOSPITAL_COMMUNITY): Payer: BLUE CROSS/BLUE SHIELD

## 2016-02-26 ENCOUNTER — Other Ambulatory Visit (HOSPITAL_COMMUNITY): Payer: Self-pay

## 2016-02-26 ENCOUNTER — Ambulatory Visit (HOSPITAL_COMMUNITY): Payer: Self-pay

## 2016-02-27 ENCOUNTER — Other Ambulatory Visit (HOSPITAL_COMMUNITY): Payer: Self-pay

## 2016-02-27 ENCOUNTER — Other Ambulatory Visit (HOSPITAL_COMMUNITY): Payer: BLUE CROSS/BLUE SHIELD

## 2016-02-28 ENCOUNTER — Other Ambulatory Visit (HOSPITAL_COMMUNITY): Payer: BLUE CROSS/BLUE SHIELD

## 2016-02-28 ENCOUNTER — Ambulatory Visit (HOSPITAL_COMMUNITY): Payer: Self-pay

## 2016-02-28 ENCOUNTER — Other Ambulatory Visit (HOSPITAL_COMMUNITY): Payer: Self-pay

## 2016-02-29 ENCOUNTER — Other Ambulatory Visit (HOSPITAL_COMMUNITY): Payer: BLUE CROSS/BLUE SHIELD

## 2016-03-03 ENCOUNTER — Other Ambulatory Visit (HOSPITAL_COMMUNITY): Payer: BLUE CROSS/BLUE SHIELD | Admitting: Psychiatry

## 2016-03-03 ENCOUNTER — Other Ambulatory Visit (HOSPITAL_COMMUNITY): Payer: Self-pay

## 2016-03-03 NOTE — Progress Notes (Signed)
Erin Jacobson is a 25 y.o. female, who [facility]  Pt only attended [facility], never returned writer's calls or return to the program.  A:  D/C pt today.  Will follow up with providers of choice.  [facility] if she chooses to f/u here.  Inform Dr. Ladona Ridgelaylor.        Chestine SporeLARK, RITA, M.Ed, CNA

## 2016-03-04 ENCOUNTER — Other Ambulatory Visit (HOSPITAL_COMMUNITY): Payer: BLUE CROSS/BLUE SHIELD | Admitting: Psychiatry

## 2016-03-04 ENCOUNTER — Other Ambulatory Visit (HOSPITAL_COMMUNITY): Payer: Self-pay

## 2016-03-04 ENCOUNTER — Ambulatory Visit (HOSPITAL_COMMUNITY): Payer: Self-pay

## 2016-03-04 IMAGING — CT CT ANGIO CHEST
2 of 4 series · 19 of 36 positions shown · IV contrast (OMNIPAQUE)
Comparison: Chest CT PE protocol 12/08/2014

CLINICAL DATA: Pregnant patient with chest pain, known left upper
extremity DVT. In consistent compliance with Lovenox.

EXAM:
CT ANGIOGRAPHY CHEST WITH CONTRAST
TECHNIQUE: Multidetector CT imaging of the chest was performed using the
standard protocol during bolus administration of intravenous
contrast. Multiplanar CT image reconstructions and MIPs were
obtained to evaluate the vascular anatomy.
CONTRAST:  100mL OMNIPAQUE IOHEXOL 350 MG/ML SOLN

[Series 6: (person_name) thins · axial · 0.59mm/px · z∈[+882,+1125]mm · 16 of 388 slices shown]
[im 20/388  lung]
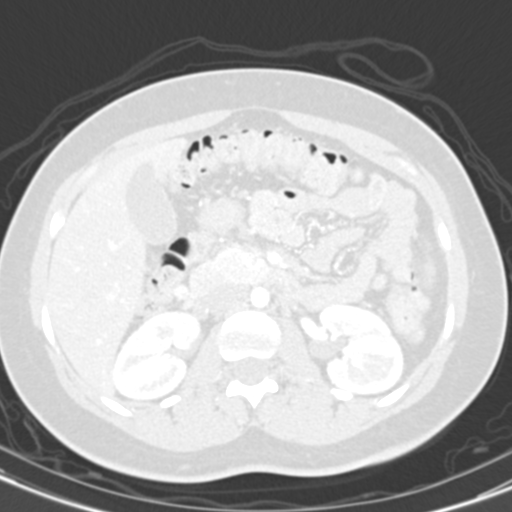
[im 39/388  mediastinal]
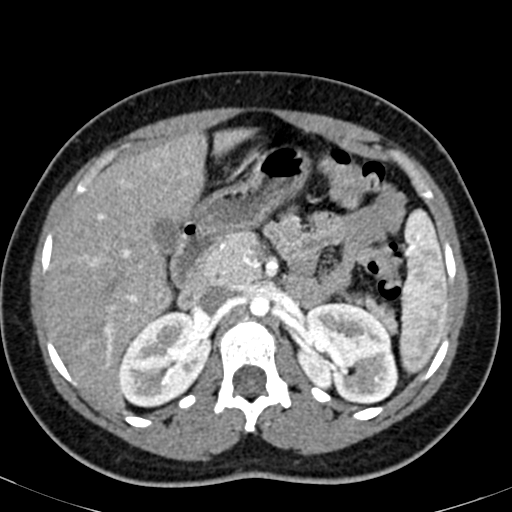
[im 59/388  lung]
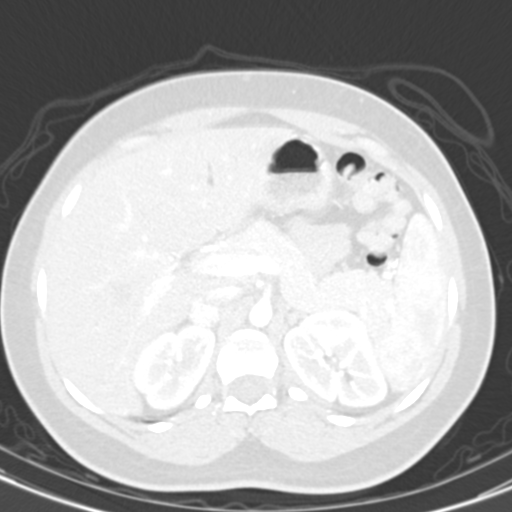
[im 97/388  mediastinal]
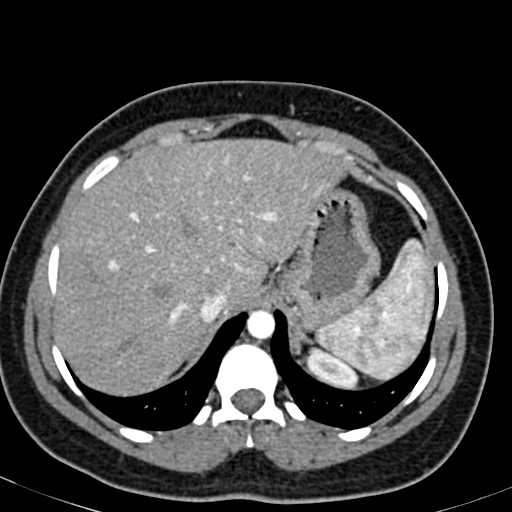
[im 117/388  lung]
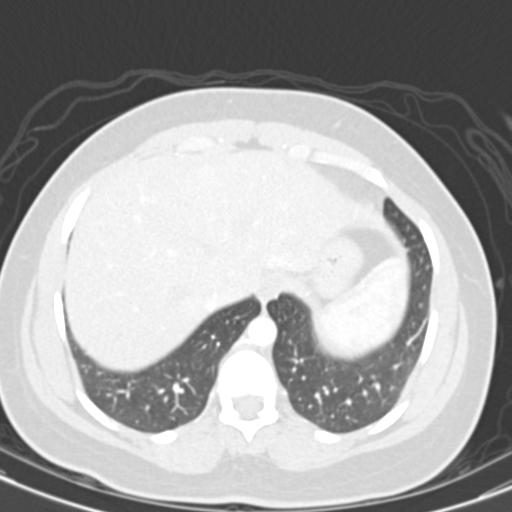
[im 136/388  mediastinal]
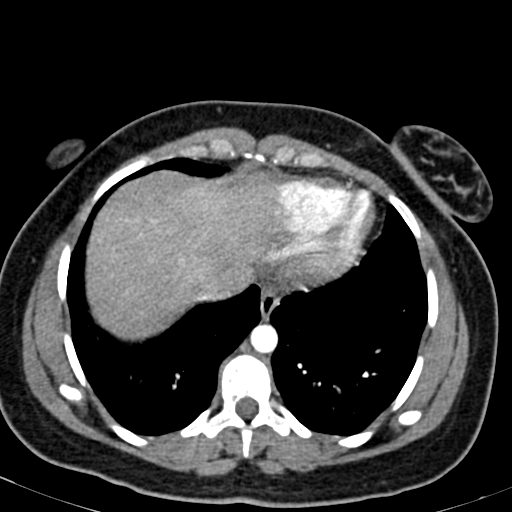
[im 155/388  lung]
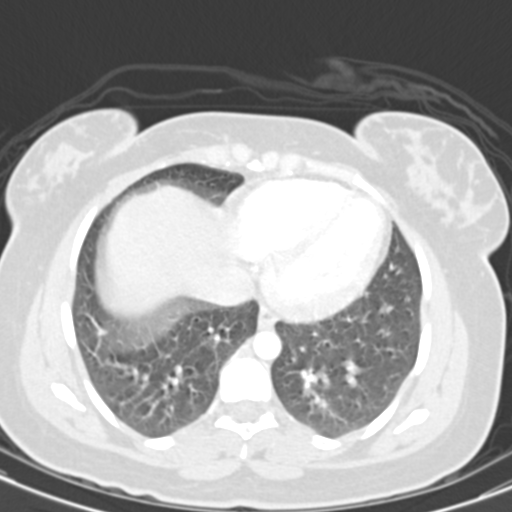
[im 175/388  mediastinal]
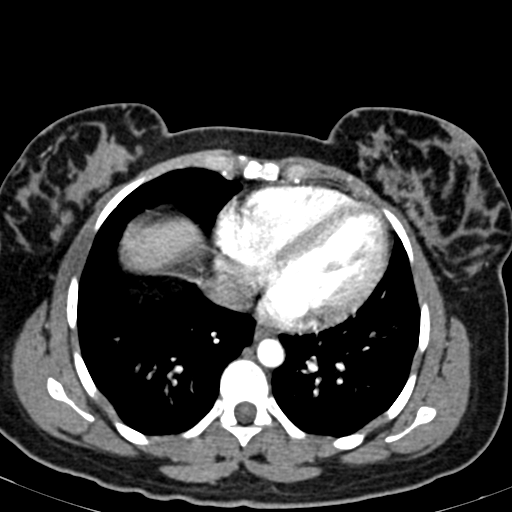
[im 213/388  lung]
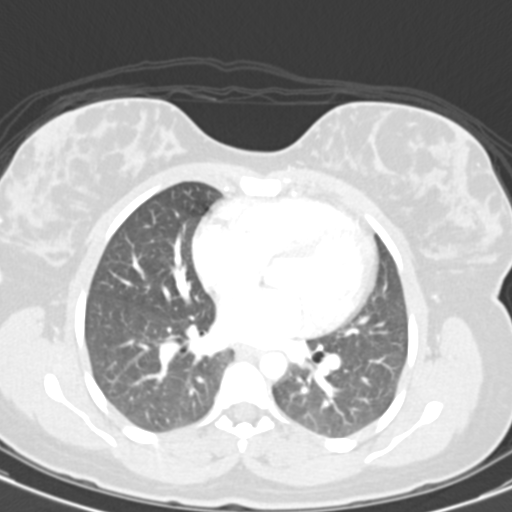
[im 233/388  mediastinal]
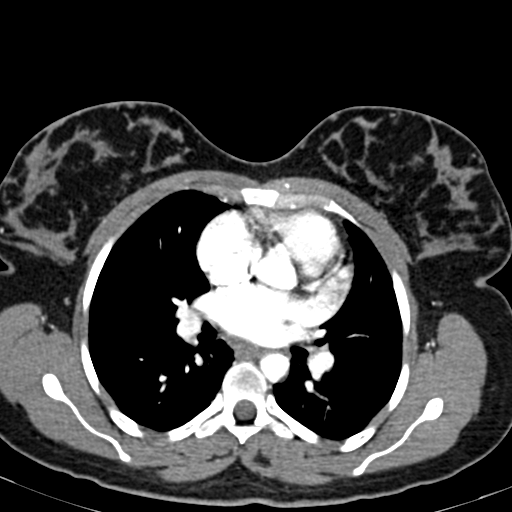
[im 252/388  lung]
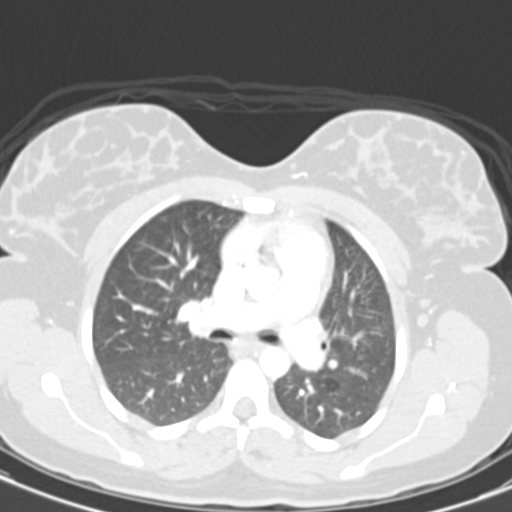
[im 271/388  mediastinal]
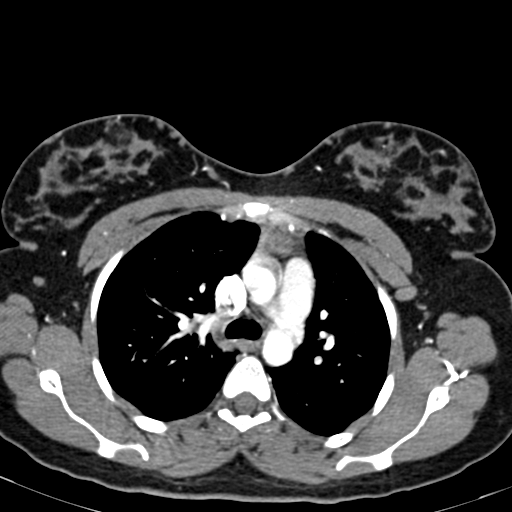
[im 291/388  lung]
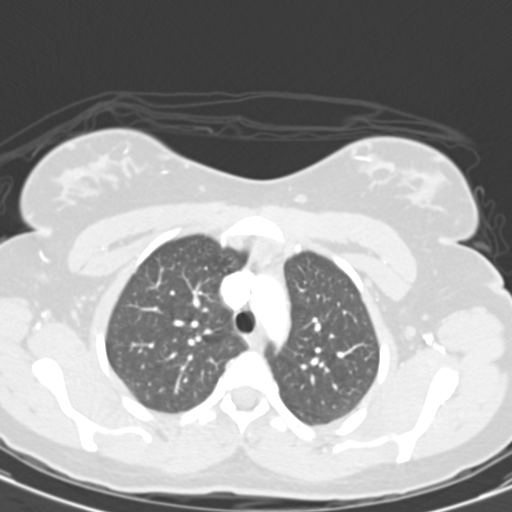
[im 329/388  mediastinal]
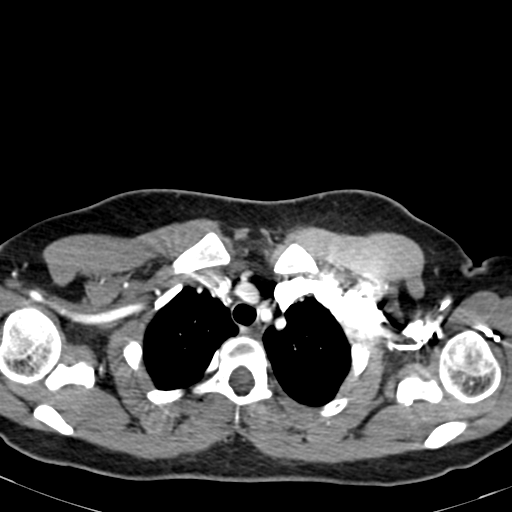
[im 349/388  lung]
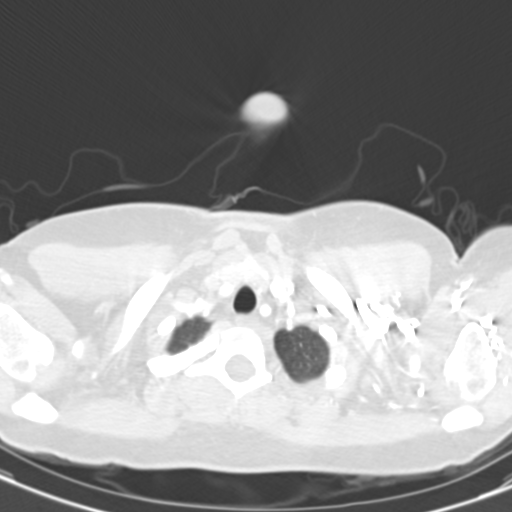
[im 368/388  mediastinal]
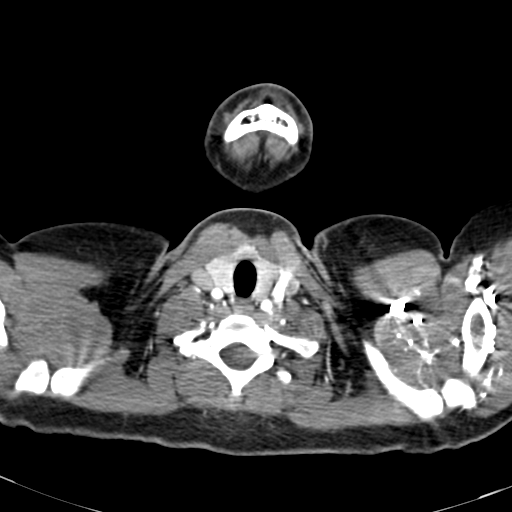

[Series 602: <mpr thick range> · coronal · 0.59mm/px · 3 of 81 slices shown]
[im 17/81  mediastinal]
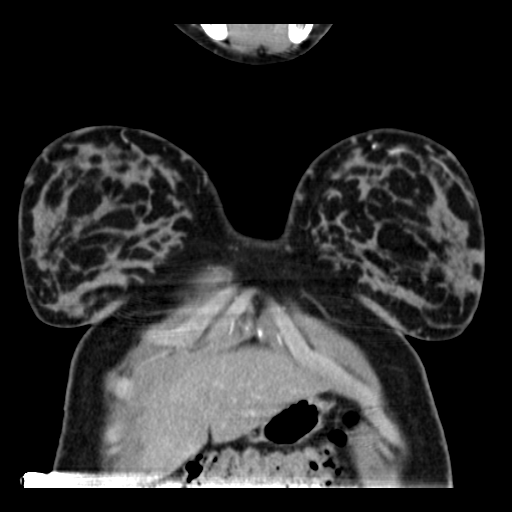
[im 33/81  mediastinal]
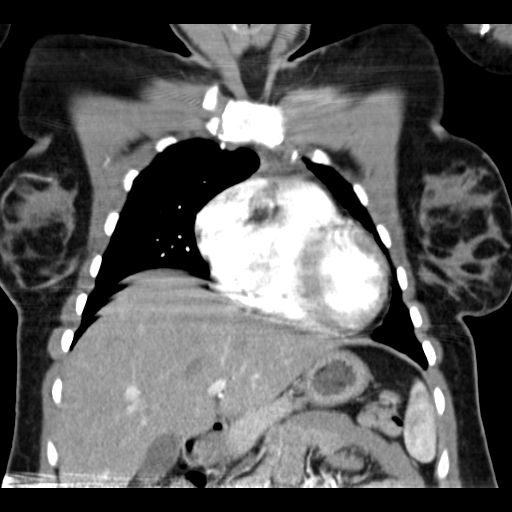
[im 49/81  mediastinal]
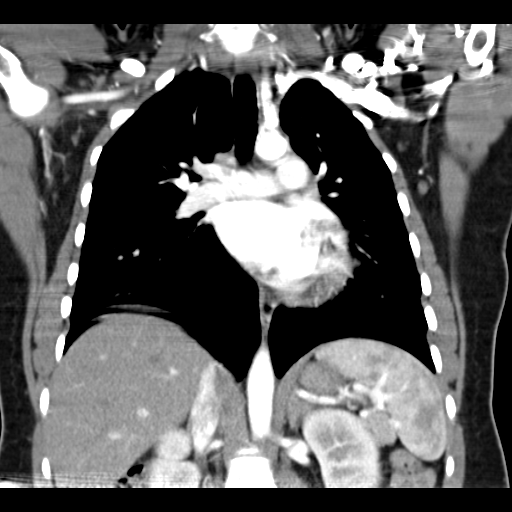

[19 of 36 positions shown; findings below may reference images not displayed]

FINDINGS: There are no filling defects within the pulmonary arteries to
suggest pulmonary embolus. Motion artifact through the lung bases
limits detailed assessment.

The thoracic aorta is normal in caliber without dissection. The
heart size is normal. There is no pleural or pericardial effusion.
No pleural or pericardial effusion. Soft tissue density anterior
mediastinum consistent with residual thymus, unchanged.

No consolidation, pulmonary nodule or mass. Previously described
heterogeneous lung attenuation is less pronounced than on prior.
Trachea and mainstem bronchi are patent.

No acute abnormality in the upper abdomen. There are no acute or
suspicious osseous abnormalities.

Review of the MIP images confirms the above findings.
IMPRESSION: 1. No pulmonary embolus.
2. Heterogeneous appearance of the lung parenchyma, however improved
from prior exam, improving small airways disease.

## 2016-03-04 NOTE — Progress Notes (Signed)
IOP Discharge note  Ms Erin Jacobson essentially did not attend the IOP so there is no progress to report.  She was not interested in attending any group and did not return or return calls, consequently she is discharged. Diagnosis remains Major depression, recurrent moderate She will arrange her own follow up as she has not returned calls for help with follow up

## 2016-03-05 ENCOUNTER — Other Ambulatory Visit (HOSPITAL_COMMUNITY): Payer: Self-pay

## 2016-03-05 ENCOUNTER — Other Ambulatory Visit (HOSPITAL_COMMUNITY): Payer: BLUE CROSS/BLUE SHIELD

## 2016-03-06 ENCOUNTER — Other Ambulatory Visit (HOSPITAL_COMMUNITY): Payer: Self-pay

## 2016-03-06 ENCOUNTER — Ambulatory Visit (HOSPITAL_COMMUNITY): Payer: Self-pay

## 2016-03-07 ENCOUNTER — Other Ambulatory Visit (HOSPITAL_COMMUNITY): Payer: BLUE CROSS/BLUE SHIELD

## 2016-03-07 ENCOUNTER — Other Ambulatory Visit (HOSPITAL_COMMUNITY): Payer: Self-pay

## 2016-03-10 ENCOUNTER — Other Ambulatory Visit (HOSPITAL_COMMUNITY): Payer: BLUE CROSS/BLUE SHIELD

## 2016-03-10 ENCOUNTER — Other Ambulatory Visit (HOSPITAL_COMMUNITY): Payer: Self-pay

## 2016-03-11 ENCOUNTER — Other Ambulatory Visit (HOSPITAL_COMMUNITY): Payer: Self-pay

## 2016-03-11 ENCOUNTER — Ambulatory Visit (HOSPITAL_COMMUNITY): Payer: Self-pay

## 2016-03-12 ENCOUNTER — Other Ambulatory Visit (HOSPITAL_COMMUNITY): Payer: Self-pay

## 2016-03-12 ENCOUNTER — Other Ambulatory Visit (HOSPITAL_COMMUNITY): Payer: BLUE CROSS/BLUE SHIELD

## 2016-03-13 ENCOUNTER — Other Ambulatory Visit (HOSPITAL_COMMUNITY): Payer: Self-pay

## 2016-03-13 ENCOUNTER — Ambulatory Visit (HOSPITAL_COMMUNITY): Payer: Self-pay

## 2016-03-14 ENCOUNTER — Other Ambulatory Visit (HOSPITAL_COMMUNITY): Payer: BLUE CROSS/BLUE SHIELD

## 2016-03-18 ENCOUNTER — Other Ambulatory Visit (HOSPITAL_COMMUNITY): Payer: BLUE CROSS/BLUE SHIELD

## 2016-03-19 ENCOUNTER — Other Ambulatory Visit (HOSPITAL_COMMUNITY): Payer: Self-pay

## 2016-03-19 ENCOUNTER — Other Ambulatory Visit (HOSPITAL_COMMUNITY): Payer: BLUE CROSS/BLUE SHIELD

## 2016-03-20 ENCOUNTER — Other Ambulatory Visit (HOSPITAL_COMMUNITY): Payer: Self-pay

## 2016-03-20 ENCOUNTER — Ambulatory Visit (HOSPITAL_COMMUNITY): Payer: Self-pay

## 2016-03-21 ENCOUNTER — Other Ambulatory Visit (HOSPITAL_COMMUNITY): Payer: BLUE CROSS/BLUE SHIELD

## 2016-03-25 ENCOUNTER — Other Ambulatory Visit (HOSPITAL_COMMUNITY): Payer: BLUE CROSS/BLUE SHIELD

## 2016-03-26 ENCOUNTER — Other Ambulatory Visit (HOSPITAL_COMMUNITY): Payer: BLUE CROSS/BLUE SHIELD

## 2016-03-27 ENCOUNTER — Other Ambulatory Visit (HOSPITAL_COMMUNITY): Payer: BLUE CROSS/BLUE SHIELD

## 2016-03-28 ENCOUNTER — Other Ambulatory Visit (HOSPITAL_COMMUNITY): Payer: BLUE CROSS/BLUE SHIELD

## 2016-03-31 ENCOUNTER — Other Ambulatory Visit (HOSPITAL_COMMUNITY): Payer: BLUE CROSS/BLUE SHIELD

## 2016-04-01 ENCOUNTER — Other Ambulatory Visit (HOSPITAL_COMMUNITY): Payer: BLUE CROSS/BLUE SHIELD

## 2016-04-02 ENCOUNTER — Other Ambulatory Visit (HOSPITAL_COMMUNITY): Payer: BLUE CROSS/BLUE SHIELD

## 2016-04-03 ENCOUNTER — Other Ambulatory Visit (HOSPITAL_COMMUNITY): Payer: BLUE CROSS/BLUE SHIELD

## 2016-04-04 ENCOUNTER — Other Ambulatory Visit (HOSPITAL_COMMUNITY): Payer: BLUE CROSS/BLUE SHIELD

## 2016-04-07 ENCOUNTER — Other Ambulatory Visit (HOSPITAL_COMMUNITY): Payer: BLUE CROSS/BLUE SHIELD

## 2016-04-08 ENCOUNTER — Other Ambulatory Visit (HOSPITAL_COMMUNITY): Payer: BLUE CROSS/BLUE SHIELD

## 2016-04-09 ENCOUNTER — Other Ambulatory Visit (HOSPITAL_COMMUNITY): Payer: BLUE CROSS/BLUE SHIELD

## 2016-04-10 ENCOUNTER — Other Ambulatory Visit (HOSPITAL_COMMUNITY): Payer: BLUE CROSS/BLUE SHIELD

## 2016-04-11 ENCOUNTER — Other Ambulatory Visit (HOSPITAL_COMMUNITY): Payer: BLUE CROSS/BLUE SHIELD

## 2016-04-14 ENCOUNTER — Other Ambulatory Visit (HOSPITAL_COMMUNITY): Payer: BLUE CROSS/BLUE SHIELD

## 2016-04-15 ENCOUNTER — Other Ambulatory Visit (HOSPITAL_COMMUNITY): Payer: BLUE CROSS/BLUE SHIELD

## 2016-04-16 ENCOUNTER — Other Ambulatory Visit (HOSPITAL_COMMUNITY): Payer: BLUE CROSS/BLUE SHIELD

## 2016-04-17 ENCOUNTER — Other Ambulatory Visit (HOSPITAL_COMMUNITY): Payer: BLUE CROSS/BLUE SHIELD

## 2017-09-28 ENCOUNTER — Other Ambulatory Visit (HOSPITAL_COMMUNITY)
Admission: RE | Admit: 2017-09-28 | Discharge: 2017-09-28 | Disposition: A | Discharge status: Home / Self Care | Payer: Medicaid Other | Source: Ambulatory Visit | Attending: Family Medicine | Admitting: Family Medicine

## 2017-09-28 ENCOUNTER — Other Ambulatory Visit: Payer: Self-pay | Admitting: Physician Assistant

## 2017-09-28 DIAGNOSIS — Z01419 Encounter for gynecological examination (general) (routine) without abnormal findings: Secondary | ICD-10-CM | POA: Diagnosis not present

## 2017-09-29 LAB — CYTOLOGY - PAP: Diagnosis: NEGATIVE

## 2017-11-08 ENCOUNTER — Inpatient Hospital Stay (HOSPITAL_COMMUNITY)
Admission: AD | Admit: 2017-11-08 | Discharge: 2017-11-09 | Disposition: A | Discharge status: Home / Self Care | Payer: Medicaid Other | Source: Ambulatory Visit | Attending: Obstetrics and Gynecology | Admitting: Obstetrics and Gynecology

## 2017-11-08 ENCOUNTER — Inpatient Hospital Stay (HOSPITAL_COMMUNITY): Payer: Medicaid Other

## 2017-11-08 ENCOUNTER — Encounter (HOSPITAL_COMMUNITY): Payer: Self-pay

## 2017-11-08 DIAGNOSIS — N898 Other specified noninflammatory disorders of vagina: Secondary | ICD-10-CM | POA: Diagnosis present

## 2017-11-08 DIAGNOSIS — O468X1 Other antepartum hemorrhage, first trimester: Secondary | ICD-10-CM

## 2017-11-08 DIAGNOSIS — A599 Trichomoniasis, unspecified: Secondary | ICD-10-CM | POA: Diagnosis present

## 2017-11-08 DIAGNOSIS — B9689 Other specified bacterial agents as the cause of diseases classified elsewhere: Secondary | ICD-10-CM | POA: Diagnosis not present

## 2017-11-08 DIAGNOSIS — Z3A01 Less than 8 weeks gestation of pregnancy: Secondary | ICD-10-CM | POA: Diagnosis not present

## 2017-11-08 DIAGNOSIS — O23591 Infection of other part of genital tract in pregnancy, first trimester: Secondary | ICD-10-CM | POA: Diagnosis not present

## 2017-11-08 DIAGNOSIS — O418X10 Other specified disorders of amniotic fluid and membranes, first trimester, not applicable or unspecified: Secondary | ICD-10-CM

## 2017-11-08 DIAGNOSIS — O98311 Other infections with a predominantly sexual mode of transmission complicating pregnancy, first trimester: Secondary | ICD-10-CM | POA: Diagnosis not present

## 2017-11-08 DIAGNOSIS — R109 Unspecified abdominal pain: Secondary | ICD-10-CM

## 2017-11-08 DIAGNOSIS — Z87891 Personal history of nicotine dependence: Secondary | ICD-10-CM | POA: Insufficient documentation

## 2017-11-08 DIAGNOSIS — O26899 Other specified pregnancy related conditions, unspecified trimester: Secondary | ICD-10-CM

## 2017-11-08 DIAGNOSIS — O468X9 Other antepartum hemorrhage, unspecified trimester: Secondary | ICD-10-CM

## 2017-11-08 DIAGNOSIS — O208 Other hemorrhage in early pregnancy: Secondary | ICD-10-CM | POA: Insufficient documentation

## 2017-11-08 DIAGNOSIS — A5901 Trichomonal vulvovaginitis: Secondary | ICD-10-CM | POA: Insufficient documentation

## 2017-11-08 DIAGNOSIS — N760 Acute vaginitis: Secondary | ICD-10-CM

## 2017-11-08 DIAGNOSIS — O418X90 Other specified disorders of amniotic fluid and membranes, unspecified trimester, not applicable or unspecified: Secondary | ICD-10-CM | POA: Diagnosis present

## 2017-11-08 LAB — URINALYSIS, ROUTINE W REFLEX MICROSCOPIC
BILIRUBIN URINE: NEGATIVE
GLUCOSE, UA: NEGATIVE mg/dL
HGB URINE DIPSTICK: NEGATIVE
KETONES UR: 5 mg/dL — AB
Nitrite: NEGATIVE
PH: 5 (ref 5.0–8.0)
Protein, ur: 30 mg/dL — AB
Specific Gravity, Urine: 1.028 (ref 1.005–1.030)

## 2017-11-08 LAB — HCG, QUANTITATIVE, PREGNANCY: hCG, Beta Chain, Quant, S: 24082 m[IU]/mL — ABNORMAL HIGH (ref <5)

## 2017-11-08 LAB — CBC
HEMATOCRIT: 33.8 % — AB (ref 36.0–46.0)
HEMOGLOBIN: 11.3 g/dL — AB (ref 12.0–15.0)
MCH: 27.6 pg (ref 26.0–34.0)
MCHC: 33.4 g/dL (ref 30.0–36.0)
MCV: 82.6 fL (ref 78.0–100.0)
Platelets: 325 10*3/uL (ref 150–400)
RBC: 4.09 MIL/uL (ref 3.87–5.11)
RDW: 17.3 % — ABNORMAL HIGH (ref 11.5–15.5)
WBC: 13.3 10*3/uL — AB (ref 4.0–10.5)

## 2017-11-08 LAB — WET PREP, GENITAL
Sperm: NONE SEEN
Yeast Wet Prep HPF POC: NONE SEEN

## 2017-11-08 LAB — POCT PREGNANCY, URINE: PREG TEST UR: POSITIVE — AB

## 2017-11-08 NOTE — MAU Note (Signed)
Went to PCP and took a pill to treat a her vaginitis 3 days ago.  Doesn't feel like the medicine worked for her.  Started having a lot of brownish-yellow discharge that is heavier today.  States she is afraid she is miscarrying.  Also having a foul odor.  Abdominal cramping x2 days.  No VB.

## 2017-11-08 NOTE — MAU Provider Note (Signed)
History     CSN: [id number]  Arrival date and time: 11/08/17 2135   First Provider Initiated Contact with Patient 11/08/17 2217      Chief Complaint  Patient presents with  . Vaginal Discharge   HPI  [name] is a 27 y.o. year old [id number] female at [redacted]w[redacted]d weeks gestation who presents to MAU reporting she was seen by her PCP and dx'd with vaginitis 3 days ago. She states that her doctor told her that what she had is usually tx'd with 1 pill, but she was given 2 pills "just in case." She can't remember the name of the medication, but knows it starts with a 'F'. She doesn't feel like the medication worked for her. She reports heavy brownish-yellow vaginal d/c with a foul odor today. She is afraid she is having a miscarriage, because she has been having abdominal pain/cramping x 2 days. She denies VB.  Past Medical History:  Diagnosis Date  . Complication of anesthesia   . DVT of upper extremity (deep vein thrombosis) (HCC)   . GERD (gastroesophageal reflux disease)    no current meds for reflux  . Kidney stone     Past Surgical History:  Procedure Laterality Date  . APPENDECTOMY  04/01/2004   ex. lap.  . CESAREAN SECTION N/A 06/11/2015   Procedure: CESAREAN SECTION;  Surgeon: Marlow Baarsyanna Clark, MD;  Location: [facility];  Service: Obstetrics;  Laterality: N/A;  . CYSTOSCOPY WITH RETROGRADE PYELOGRAM, URETEROSCOPY AND STENT PLACEMENT Left 05/05/2014   Procedure: LEFT  URETEROSCOPY,  RETROGRADE PYELOGRAM,  AND STENT PLACEMENT, STONE REMOVAL;  Surgeon: Crist FatBenjamin W Herrick, MD;  Location: [facility];  Service: Urology;  Laterality: Left;  . HOLMIUM LASER APPLICATION Left 05/05/2014   Procedure: HOLMIUM LASER APPLICATION;  Surgeon: Crist FatBenjamin W Herrick, MD;  Location: [facility];  Service: Urology;  Laterality: Left;  . KNEE SURGERY      Family History  Problem Relation Age of Onset  . Anesthesia problems Mother        post-op N/V, hard to wake up  . Arthritis Mother     Social History   Tobacco  Use  . Smoking status: Former Smoker    Types: Cigars    Last attempt to quit: 09/24/2014    Years since quitting: 3.1  . Smokeless tobacco: Never Used  Substance Use Topics  . Alcohol use: Not Currently    Comment: occasional/ not since pregnancy  . Drug use: No    Allergies: No Known Allergies  Medications Prior to Admission  Medication Sig Dispense Refill Last Dose  . folic acid (FOLVITE) 400 MCG tablet Take 400 mcg by mouth at bedtime.   11/08/2017 at Unknown time  . acetaminophen (TYLENOL) 325 MG tablet Take 650 mg by mouth every 6 (six) hours as needed for moderate pain or headache.    unknown  . oxyCODONE (OXY IR/ROXICODONE) 5 MG immediate release tablet Take 5 mg by mouth every 4 (four) hours as needed (for pain scale 4-7).   Past Month at Unknown time  . phenazopyridine (PYRIDIUM) 200 MG tablet Take 1 tablet (200 mg total) by mouth 3 (three) times daily. (Patient not taking: Reported on 08/25/2015) 9 tablet 0 Completed Course at Unknown time    Review of Systems  Constitutional: Negative.   HENT: Negative.   Eyes: Negative.   Respiratory: Negative.   Cardiovascular: Negative.   Gastrointestinal: Positive for abdominal pain.  Endocrine: Negative.   Genitourinary: Positive for vaginal discharge ("brownish-yellow d/c,  foul odor and I think vaginitis wasn't completely tx'd" ).  Musculoskeletal: Negative.   Skin: Negative.   Allergic/Immunologic: Negative.   Neurological: Negative.   Hematological: Negative.   Psychiatric/Behavioral: Negative.    Physical Exam   Blood pressure 129/68, pulse 98, temperature 98.6 F (37 C), resp. rate 17, height 5\' 1"  (1.549 m), weight 59.9 kg.  Physical Exam  Nursing note and vitals reviewed. Constitutional: She is oriented to person, place, and time. She appears well-developed and well-nourished.  HENT:  Head: Normocephalic and atraumatic.  Eyes: Pupils are equal, round, and reactive to light.  Neck: Normal range of motion.   Cardiovascular: Normal rate.  Respiratory: Effort normal.  GI: Soft.  Genitourinary:  Genitourinary Comments: Uterus: non-tender, SE: cervix is smooth, pink, no lesions, moderate amt of yellowish-white vaginal d/c -- WP, GC/CT done, closed/long/firm, no CMT, (+) friability, no adnexal tenderness // suspected retroflexed uterus - cervix very anterior  Musculoskeletal: Normal range of motion.  Neurological: She is alert and oriented to person, place, and time. She has normal reflexes.  Skin: Skin is warm and dry.  Psychiatric: She has a normal mood and affect. Her behavior is normal. Judgment and thought content normal.    MAU Course  Procedures  MDM CCUA UPT CBC w/Diff ABO/Rh HCG Wet Prep GC/CT -- pending HIV -- pending OB < 14 wks US with TV  Results for orders placed or performed during the hospital encounter of 11/08/17 (from the past 24 hour(s))  Urinalysis, Routine w reflex microscopic     Status: Abnormal   Collection Time: 11/08/17  9:49 PM  Result Value Ref Range   Color, Urine YELLOW YELLOW   APPearance HAZY (A) CLEAR   Specific Gravity, Urine 1.028 1.005 - 1.030   pH 5.0 5.0 - 8.0   Glucose, UA NEGATIVE NEGATIVE mg/dL   Hgb urine dipstick NEGATIVE NEGATIVE   Bilirubin Urine NEGATIVE NEGATIVE   Ketones, ur 5 (A) NEGATIVE mg/dL   Protein, ur 30 (A) NEGATIVE mg/dL   Nitrite NEGATIVE NEGATIVE   Leukocytes, UA LARGE (A) NEGATIVE   RBC / HPF 6-10 0 - 5 RBC/hpf   WBC, UA 21-50 0 - 5 WBC/hpf   Bacteria, UA RARE (A) NONE SEEN   Squamous Epithelial / LPF 11-20 0 - 5   Mucus PRESENT   Pregnancy, urine POC     Status: Abnormal   Collection Time: 11/08/17 10:08 PM  Result Value Ref Range   Preg Test, Ur POSITIVE (A) NEGATIVE  Wet prep, genital     Status: Abnormal   Collection Time: 11/08/17 10:26 PM  Result Value Ref Range   Yeast Wet Prep HPF POC NONE SEEN NONE SEEN   Trich, Wet Prep PRESENT (A) NONE SEEN   Clue Cells Wet Prep HPF POC PRESENT (A) NONE SEEN    WBC, Wet Prep HPF POC MANY (A) NONE SEEN   Sperm NONE SEEN   CBC     Status: Abnormal   Collection Time: 11/08/17 10:33 PM  Result Value Ref Range   WBC 13.3 (H) 4.0 - 10.5 K/uL   RBC 4.09 3.87 - 5.11 MIL/uL   Hemoglobin 11.3 (L) 12.0 - 15.0 g/dL   HCT 16.133.8 (L) 09.636.0 - 04.546.0 %   MCV 82.6 78.0 - 100.0 fL   MCH 27.6 26.0 - 34.0 pg   MCHC 33.4 30.0 - 36.0 g/dL   RDW 40.917.3 (H) 81.111.5 - 91.415.5 %   Platelets 325 150 - 400 K/uL  hCG, quantitative, pregnancy  Status: Abnormal   Collection Time: 11/08/17 10:33 PM  Result Value Ref Range   hCG, Beta Chain, Quant, S 24,082 (H) <5 mIU/mL    US Ob Less Than 14 Weeks With Ob Transvaginal  Result Date: 11/08/2017 CLINICAL DATA:  Cramping for 2 days with positive urine pregnancy test EXAM: OBSTETRIC <14 WK [place] AND TRANSVAGINAL OB US TECHNIQUE: Both transabdominal and transvaginal ultrasound examinations were performed for complete evaluation of the gestation as well as the maternal uterus, adnexal regions, and pelvic cul-de-sac. Transvaginal technique was performed to assess early pregnancy. COMPARISON:  None. FINDINGS: Intrauterine gestational sac: Single intrauterine gestation Yolk sac:  Visible Embryo:  Visible Cardiac Activity: Visible Heart Rate: 111 bpm CRL: 3 mm   5 w   5 d                  [place] EDC: 07/06/2018 Subchorionic hemorrhage: Small subchorionic hemorrhage left side of the sac measuring 2 x 0.7 x 0.6 cm. Maternal uterus/adnexae: Ovaries are within normal limits. Left ovary measures 2.6 x 2.5 x 1.2 cm. Right ovary measures 3.4 x 1.6 by 2.3 cm. Trace free fluid in the pelvis IMPRESSION: 1. Single viable intrauterine pregnancy as above. 2. Small subchorionic hemorrhage Electronically Signed   By: Jasmine Pang M.D.   On: 11/08/2017 23:18    *Consult with Dr. Tenny Craw @ [phone] - notified of patient's complaints, assessments, lab & U/S results, tx plan d/c home with expedited partner therapy Rx - ok to d/c home, agrees with plan  Assessment and Plan   Trichimoniasis - Flagyl 2 grams given prior to d/c home - Expedited partner therapy Rx given to patient for Claria Dice - Information provided on Trich   Abdominal pain affecting pregnancy - Advised that abd pain is more than likely from having trich and BV - Information provided on abdominal pain in pregnancy   Bacterial vaginitis - Flagyl 2 grams given prior to d/c home - Information provided on BV   Subchorionic Hematoma - Information provided on [facility] & threatened miscarriage   - Discharge home - Patient verbalized an understanding of the plan of care and agrees.   Raelyn Mora, MSN, CNM 11/08/2017, 10:25 PM

## 2017-11-09 DIAGNOSIS — O418X90 Other specified disorders of amniotic fluid and membranes, unspecified trimester, not applicable or unspecified: Secondary | ICD-10-CM | POA: Diagnosis present

## 2017-11-09 DIAGNOSIS — O468X9 Other antepartum hemorrhage, unspecified trimester: Secondary | ICD-10-CM

## 2017-11-09 LAB — HIV ANTIBODY (ROUTINE TESTING W REFLEX): HIV SCREEN 4TH GENERATION: NONREACTIVE

## 2017-11-09 LAB — GC/CHLAMYDIA PROBE AMP (~~LOC~~) NOT AT ARMC
Chlamydia: NEGATIVE
Neisseria Gonorrhea: NEGATIVE

## 2017-11-09 MED ORDER — METRONIDAZOLE 500 MG PO TABS
2000.0000 mg | ORAL_TABLET | Freq: Once | ORAL | Status: AC
  Administered 2017-11-09: 2000 mg via ORAL
  Filled 2017-11-09: qty 4

## 2017-11-09 NOTE — Discharge Instructions (Signed)
Expedited Partner Therapy:  °Information Sheet for Patients and Partners  °            ° °You have been offered expedited partner therapy (EPT). This information sheet contains important information and warnings you need to be aware of, so please read it carefully.  ° °Expedited Partner Therapy (EPT) is the clinical practice of treating the sexual partners of persons who receive chlamydia, gonorrhea, or trichomoniasis diagnoses by providing medications or prescriptions to the patient. Patients then provide partners with these therapies without the health-care provider having examined the partner. In other words, EPT is a convenient, fast and private way for patients to help their sexual partners get treated.  ° °Chlamydia and gonorrhea are bacterial infections you get from having sex with a person who is already infected. Trichomoniasis (or “trich”) is a very common sexually transmitted infection (STI) that is caused by infection with a protozoan parasite called Trichomonas vaginalis.  Many people with these infections don’t know it because they feel fine, but without treatment these infections can cause serious health problems, such as pelvic inflammatory disease, ectopic pregnancy, infertility and increased risk of HIV.  ° °It is important to get treated as soon as possible to protect your health, to avoid spreading these infections to others, and to prevent yourself from becoming re-infected. The good news is these infections can be easily cured with proper antibiotic medicine. The best way to take care of your self is to see a doctor or go to your local health department. If you are not able to see a doctor or other medical provider, you should take EPT.  ° ° °Recommended Medication: °EPT for Chlamydia:  Azithromycin (Zithromax) 1 gram orally in a single dose °EPT for Gonorrhea:  Cefixime (Suprax) 400 milligrams orally in a single dose PLUS azithromycin (Zithromax) 1 gram orally in a single dose °EPT for  Trichomoniasis:  Metronidazole (Flagyl) 2 grams orally in a single dose ° ° °These medicines are very safe. However, you should not take them if you have ever had an allergic reaction (like a rash) to any of these medicines: azithromycin (Zithromax), erythromycin, clarithromycin (Biaxin), metronidazole (Flagyl), tinidazole (Tindimax). If you are uncertain about whether you have an allergy, call your medical provider or pharmacist before taking this medicine. If you have a serious, long-term illness like kidney, liver or heart disease, colitis or stomach problems, or you are currently taking other prescription medication, talk to your provider before taking this medication.  ° °Women: If you have lower belly pain, pain during sex, vomiting, or a fever, do not take this medicine. Instead, you should see a medical provider to be certain you do not have pelvic inflammatory disease (PID). PID can be serious and lead to infertility, pregnancy problems or chronic pelvic pain.  ° °Pregnant Women: It is very important for you to see a doctor to get pregnancy services and pre-natal care. These antibiotics for EPT are safe for pregnant women, but you still need to see a medical provider as soon as possible. It is also important to note that Doxycycline is an alternative therapy for chlamydia, but it should not be taken by someone who is pregnant.  ° °Men: If you have pain or swelling in the testicles or a fever, do not take this medicine and see a medical provider.    ° °Men who have sex with men (MSM): MSM in [place] continue to experience high rates of syphilis and HIV. Many MSM with gonorrhea or   chlamydia could also have syphilis and/or HIV and not know it. If you are a man who has sex with other men, it is very important that you see a medical provider and are tested for HIV and syphilis. EPT is not recommended for gonorrhea for MSM.  Recommended treatment for gonorrhea for MSM is Rocephin (shot) AND azithromycin  due to decreased cure rate.  Please see your medical provider if this is the case.   ° °Along with this information sheet is a prescription for the medicine. If you receive a prescription it will be in your name and will indicate your date of birth, or it will be in the name of “Expedited Partner Therapy”.   In either case, you can have the prescription filled at a pharmacy. You will be responsible for the cost of the medicine, unless you have prescription drug coverage. In that case, you could provide your name so the pharmacy could bill your health plan.  ° °Take the medication as directed. Some people will have a mild, upset stomach, which does not last long. AVOID alcohol 24 hours after taking metronidazole (Flagyl) to reduce the possibility of a disulfiram-like reaction (severe vomiting and abdominal pain).  After taking the medicine, do not have sex for 7 days. Do not share this medicine or give it to anyone else. It is important to tell everyone you have had sex with in the last 60 days that they need to go and get tested for sexually transmitted infections.  ° °Ways to prevent these and other sexually transmitted infections (STIs):  ° °• Abstain from sex. This is the only sure way to avoid getting an STI.  °• Use barrier methods, such as condoms, consistently and correctly.  °• Limit the number of sexual partners.  °• Have regular physical exams, including testing for STIs.  ° °For more information about EPT or other issues pertaining to an STI, please contact your medical provider or the [place] [facility] at [phone] or [email or web address]   ° °

## 2017-11-25 DIAGNOSIS — Z3490 Encounter for supervision of normal pregnancy, unspecified, unspecified trimester: Secondary | ICD-10-CM | POA: Insufficient documentation

## 2018-01-07 ENCOUNTER — Encounter (HOSPITAL_COMMUNITY): Payer: Self-pay

## 2018-01-07 ENCOUNTER — Inpatient Hospital Stay (HOSPITAL_COMMUNITY)
Admission: AD | Admit: 2018-01-07 | Discharge: 2018-01-08 | Disposition: A | Discharge status: Home / Self Care | Payer: Medicaid Other | Source: Ambulatory Visit | Attending: Obstetrics and Gynecology | Admitting: Obstetrics and Gynecology

## 2018-01-07 DIAGNOSIS — O210 Mild hyperemesis gravidarum: Secondary | ICD-10-CM | POA: Insufficient documentation

## 2018-01-07 DIAGNOSIS — E860 Dehydration: Secondary | ICD-10-CM

## 2018-01-07 DIAGNOSIS — Z3A14 14 weeks gestation of pregnancy: Secondary | ICD-10-CM | POA: Insufficient documentation

## 2018-01-07 DIAGNOSIS — O26899 Other specified pregnancy related conditions, unspecified trimester: Secondary | ICD-10-CM

## 2018-01-07 DIAGNOSIS — Z86718 Personal history of other venous thrombosis and embolism: Secondary | ICD-10-CM | POA: Insufficient documentation

## 2018-01-07 DIAGNOSIS — O219 Vomiting of pregnancy, unspecified: Secondary | ICD-10-CM

## 2018-01-07 DIAGNOSIS — Z87891 Personal history of nicotine dependence: Secondary | ICD-10-CM | POA: Diagnosis not present

## 2018-01-07 DIAGNOSIS — O99280 Endocrine, nutritional and metabolic diseases complicating pregnancy, unspecified trimester: Secondary | ICD-10-CM

## 2018-01-07 DIAGNOSIS — Z8759 Personal history of other complications of pregnancy, childbirth and the puerperium: Secondary | ICD-10-CM

## 2018-01-07 DIAGNOSIS — R109 Unspecified abdominal pain: Secondary | ICD-10-CM

## 2018-01-07 DIAGNOSIS — O99612 Diseases of the digestive system complicating pregnancy, second trimester: Secondary | ICD-10-CM

## 2018-01-07 DIAGNOSIS — K5900 Constipation, unspecified: Secondary | ICD-10-CM

## 2018-01-07 LAB — CBC
HEMATOCRIT: 29.5 % — AB (ref 36.0–46.0)
Hemoglobin: 10.3 g/dL — ABNORMAL LOW (ref 12.0–15.0)
MCH: 29.5 pg (ref 26.0–34.0)
MCHC: 34.9 g/dL (ref 30.0–36.0)
MCV: 84.5 fL (ref 80.0–100.0)
NRBC: 0 % (ref 0.0–0.2)
Platelets: 268 10*3/uL (ref 150–400)
RBC: 3.49 MIL/uL — ABNORMAL LOW (ref 3.87–5.11)
RDW: 14.1 % (ref 11.5–15.5)
WBC: 8.1 10*3/uL (ref 4.0–10.5)

## 2018-01-07 LAB — URINALYSIS, ROUTINE W REFLEX MICROSCOPIC
BILIRUBIN URINE: NEGATIVE
GLUCOSE, UA: NEGATIVE mg/dL
HGB URINE DIPSTICK: NEGATIVE
Ketones, ur: 80 mg/dL — AB
Nitrite: NEGATIVE
PH: 5 (ref 5.0–8.0)
Protein, ur: 30 mg/dL — AB
SPECIFIC GRAVITY, URINE: 1.04 — AB (ref 1.005–1.030)

## 2018-01-07 MED ORDER — SERTRALINE HCL 25 MG PO TABS: 25.0000 mg | ORAL_TABLET | Freq: Every day | ORAL | 0 refills | Status: DC

## 2018-01-07 MED ORDER — ENOXAPARIN SODIUM 40 MG/0.4ML ~~LOC~~ SOLN: 40.0000 mg | SUBCUTANEOUS | 3 refills | Status: DC

## 2018-01-07 MED ORDER — LACTATED RINGERS IV BOLUS
1000.0000 mL | Freq: Once | INTRAVENOUS | Status: AC
  Administered 2018-01-07: 1000 mL via INTRAVENOUS

## 2018-01-07 MED ORDER — POLYETHYLENE GLYCOL 3350 17 GM/SCOOP PO POWD: 17.0000 g | Freq: Every day | ORAL | 2 refills | Status: DC | PRN

## 2018-01-07 MED ORDER — PROMETHAZINE HCL 25 MG/ML IJ SOLN
25.0000 mg | Freq: Once | INTRAVENOUS | Status: AC
  Administered 2018-01-07: 25 mg via INTRAVENOUS
  Filled 2018-01-07: qty 1

## 2018-01-07 MED ORDER — PROMETHAZINE HCL 25 MG PO TABS: 25.0000 mg | ORAL_TABLET | Freq: Four times a day (QID) | ORAL | 3 refills | Status: DC | PRN

## 2018-01-07 MED ORDER — SERTRALINE HCL 25 MG PO TABS
25.0000 mg | ORAL_TABLET | Freq: Once | ORAL | Status: AC
  Administered 2018-01-07: 25 mg via ORAL
  Filled 2018-01-07: qty 1

## 2018-01-07 MED ORDER — ONDANSETRON 8 MG PO TBDP: 8.0000 mg | ORAL_TABLET | Freq: Three times a day (TID) | ORAL | 3 refills | Status: DC | PRN

## 2018-01-07 MED ORDER — ENOXAPARIN SODIUM 40 MG/0.4ML ~~LOC~~ SOLN
40.0000 mg | Freq: Once | SUBCUTANEOUS | Status: AC
  Administered 2018-01-07: 40 mg via SUBCUTANEOUS
  Filled 2018-01-07: qty 0.4

## 2018-01-07 NOTE — MAU Note (Addendum)
When asked about last BM pt states a week or two. I asked if she has had BM at all and she said no and asked if that's normal and she said  "I don't poop like that".  Pt has not had prenatal visit yet.

## 2018-01-07 NOTE — MAU Note (Signed)
Pt reports she missed her appointment at her md office and has not been able to get meds but is having nausea and vomiting 3-4 times per day, dizziness all the time. Vaginal discharge and pain.

## 2018-01-07 NOTE — Discharge Instructions (Signed)
Morning Sickness Morning sickness is when you feel sick to your stomach (nauseous) during pregnancy. This nauseous feeling may or may not come with vomiting. It often occurs in the morning but can be a problem any time of day. Morning sickness is most common during the first trimester, but it may continue throughout pregnancy. While morning sickness is unpleasant, it is usually harmless unless you develop severe and continual vomiting (hyperemesis gravidarum). This condition requires more intense treatment. What are the causes? The cause of morning sickness is not completely known but seems to be related to normal hormonal changes that occur in pregnancy. What increases the risk? You are at greater risk if you:  Experienced nausea or vomiting before your pregnancy.  Had morning sickness during a previous pregnancy.  Are pregnant with more than one baby, such as twins.  How is this treated? Do not use any medicines (prescription, over-the-counter, or herbal) for morning sickness without first talking to your health care provider. Your health care provider may prescribe or recommend:  Vitamin B6 supplements.  Anti-nausea medicines.  The herbal medicine ginger.  Follow these instructions at home:  Only take over-the-counter or prescription medicines as directed by your health care provider.  Taking multivitamins before getting pregnant can prevent or decrease the severity of morning sickness in most women.  Eat a piece of dry toast or unsalted crackers before getting out of bed in the morning.  Eat five or six small meals a day.  Eat dry and bland foods (rice, baked potato). Foods high in carbohydrates are often helpful.  Do not drink liquids with your meals. Drink liquids between meals.  Avoid greasy, fatty, and spicy foods.  Get someone to cook for you if the smell of any food causes nausea and vomiting.  If you feel nauseous after taking prenatal vitamins, take the vitamins at  night or with a snack.  Snack on protein foods (nuts, yogurt, cheese) between meals if you are hungry.  Eat unsweetened gelatins for desserts.  Wearing an acupressure wristband (worn for sea sickness) may be helpful.  Acupuncture may be helpful.  Do not smoke.  Get a humidifier to keep the air in your house free of odors.  Get plenty of fresh air. Contact a health care provider if:  Your home remedies are not working, and you need medicine.  You feel dizzy or lightheaded.  You are losing weight. Get help right away if:  You have persistent and uncontrolled nausea and vomiting.  You pass out (faint). This information is not intended to replace advice given to you by your health care provider. Make sure you discuss any questions you have with your health care provider. Document Released: 05/01/2006 Document Revised: 08/16/2015 Document Reviewed: 08/25/2012 Elsevier Interactive Patient Education  2017 [employer].   Constipation, Adult Constipation is when a person has fewer bowel movements in a week than normal, has difficulty having a bowel movement, or has stools that are dry, hard, or larger than normal. Constipation may be caused by an underlying condition. It may become worse with age if a person takes certain medicines and does not take in enough fluids. Follow these instructions at home: Eating and drinking   Eat foods that have a lot of fiber, such as fresh fruits and vegetables, whole grains, and beans.  Limit foods that are high in fat, low in fiber, or overly processed, such as french fries, hamburgers, cookies, candies, and soda.  Drink enough fluid to keep your urine clear  or pale yellow. General instructions  Exercise regularly or as told by your health care provider.  Go to the restroom when you have the urge to go. Do not hold it in.  Take over-the-counter and prescription medicines only as told by your health care provider. These include any fiber  supplements.  Practice pelvic floor retraining exercises, such as deep breathing while relaxing the lower abdomen and pelvic floor relaxation during bowel movements.  Watch your condition for any changes.  Keep all follow-up visits as told by your health care provider. This is important. Contact a health care provider if:  You have pain that gets worse.  You have a fever.  You do not have a bowel movement after 4 days.  You vomit.  You are not hungry.  You lose weight.  You are bleeding from the anus.  You have thin, pencil-like stools. Get help right away if:  You have a fever and your symptoms suddenly get worse.  You leak stool or have blood in your stool.  Your abdomen is bloated.  You have severe pain in your abdomen.  You feel dizzy or you faint. This information is not intended to replace advice given to you by your health care provider. Make sure you discuss any questions you have with your health care provider. Document Released: 12/07/2003 Document Revised: 09/28/2015 Document Reviewed: 08/29/2015 Elsevier Interactive Patient Education  2018 [employer].  Enoxaparin (Lovenox) injection What is this medicine? ENOXAPARIN (ee nox a PA rin) is used after knee, hip, or abdominal surgeries to prevent blood clotting. It is also used to treat existing blood clots in the lungs or in the veins. This medicine may be used for other purposes; ask your health care provider or pharmacist if you have questions. COMMON BRAND NAME(S): Lovenox What should I tell my health care provider before I take this medicine? They need to know if you have any of these conditions: -bleeding disorders, hemorrhage, or hemophilia -infection of the heart or heart valves -kidney or liver disease -previous stroke -prosthetic heart valve -recent surgery or delivery of a baby -ulcer in the stomach or intestine, diverticulitis, or other bowel disease -an unusual or allergic reaction to  enoxaparin, heparin, pork or pork products, other medicines, foods, dyes, or preservatives -pregnant or trying to get pregnant -breast-feeding How should I use this medicine? This medicine is for injection under the skin. It is usually given by a health-care professional. You or a family member may be trained on how to give the injections. If you are to give yourself injections, make sure you understand how to use the syringe, measure the dose if necessary, and give the injection. To avoid bruising, do not rub the site where this medicine has been injected. Do not take your medicine more often than directed. Do not stop taking except on the advice of your doctor or health care professional. Make sure you receive a puncture-resistant container to dispose of the needles and syringes once you have finished with them. Do not reuse these items. Return the container to your doctor or health care professional for proper disposal. Talk to your pediatrician regarding the use of this medicine in children. Special care may be needed. Overdosage: If you think you have taken too much of this medicine contact a poison control center or emergency room at once. NOTE: This medicine is only for you. Do not share this medicine with others. What if I miss a dose? If you miss a dose, take it  as soon as you can. If it is almost time for your next dose, take only that dose. Do not take double or extra doses. What may interact with this medicine? -aspirin and aspirin-like medicines -certain medicines that treat or prevent blood clots -dipyridamole -NSAIDs, medicines for pain and inflammation, like ibuprofen or naproxen This list may not describe all possible interactions. Give your health care provider a list of all the medicines, herbs, non-prescription drugs, or dietary supplements you use. Also tell them if you smoke, drink alcohol, or use illegal drugs. Some items may interact with your medicine. What should I watch  for while using this medicine? Visit your doctor or health care professional for regular checks on your progress. Your condition will be monitored carefully while you are receiving this medicine. Notify your doctor or health care professional and seek emergency treatment if you develop breathing problems; changes in vision; chest pain; severe, sudden headache; pain, swelling, warmth in the leg; trouble speaking; sudden numbness or weakness of the face, arm, or leg. These can be signs that your condition has gotten worse. If you are going to have surgery, tell your doctor or health care professional that you are taking this medicine. Do not stop taking this medicine without first talking to your doctor. Be sure to refill your prescription before you run out of medicine. Avoid sports and activities that might cause injury while you are using this medicine. Severe falls or injuries can cause unseen bleeding. Be careful when using sharp tools or knives. Consider using an Neurosurgeon. Take special care brushing or flossing your teeth. Report any injuries, bruising, or red spots on the skin to your doctor or health care professional. What side effects may I notice from receiving this medicine? Side effects that you should report to your doctor or health care professional as soon as possible: -allergic reactions like skin rash, itching or hives, swelling of the face, lips, or tongue -feeling faint or lightheaded, falls -signs and symptoms of bleeding such as bloody or black, tarry stools; red or dark-brown urine; spitting up blood or brown material that looks like coffee grounds; red spots on the skin; unusual bruising or bleeding from the eye, gums, or nose Side effects that usually do not require medical attention (report to your doctor or health care professional if they continue or are bothersome): -pain, redness, or irritation at site where injected This list may not describe all possible side effects.  Call your doctor for medical advice about side effects. You may report side effects to FDA at 1-800-FDA-1088. Where should I keep my medicine? Keep out of the reach of children. Store at room temperature between 15 and 30 degrees C (59 and 86 degrees F). Do not freeze. If your injections have been specially prepared, you may need to store them in the refrigerator. Ask your pharmacist. Throw away any unused medicine after the expiration date. NOTE: This sheet is a summary. It may not cover all possible information. If you have questions about this medicine, talk to your doctor, pharmacist, or health care provider.  2018 Elsevier/Gold Standard (2013-07-12 16:06:21)

## 2018-01-07 NOTE — MAU Provider Note (Addendum)
Chief Complaint: Nausea; Emesis; and Vaginal Discharge   First Provider Initiated Contact with Patient 01/07/18 1836     SUBJECTIVE HPI: Erin Jacobson is a 27 y.o. G3P2002 at [redacted]w[redacted]d who presents to Maternity Admissions reporting vomiting approximately 3 times per day x5 weeks, stomach discomfort and distention and constipation--no BM in approximately 2 weeks.  Normally has bowel movements every day.  Has not tried any medication for vomiting.  Vomits every time she eats so she only eats once a day now.  Reports 10 pound weight loss this pregnancy.  Has not started prenatal care.  Plans to get prenatal care at [facility].  Also requesting to be restarted on Zoloft due to history of severe postpartum depression and suicide attempt.  Reports stable mood today no SI/HI.  History of upper arm DVT and last pregnancy in 2016.  No other history of blood clots.  Denies having coagulopathy work-up.  Associated signs and symptoms: Negative for fever, chills, vaginal bleeding, vaginal discharge, urinary complaints.  Past Medical History:  Diagnosis Date  . Complication of anesthesia   . DVT of upper extremity (deep vein thrombosis) (HCC)   . GERD (gastroesophageal reflux disease)    no current meds for reflux  . Kidney stone    OB History  Gravida Para Term Preterm AB Living  3 2 2     2   SAB TAB Ectopic Multiple Live Births        0 2    # Outcome Date GA Lbr Len/2nd Weight Sex Delivery Anes PTL Lv  3 Current           2 Term 06/11/15 [redacted]w[redacted]d 30:55 / 04:20 3015 g M CS-LTranv EPI  LIV  1 Term 12/14/09 [redacted]w[redacted]d  2353 g F Vag-Spont   LIV   Past Surgical History:  Procedure Laterality Date  . APPENDECTOMY  04/01/2004   ex. lap.  . CESAREAN SECTION N/A 06/11/2015   Procedure: CESAREAN SECTION;  Surgeon: Marlow Baars, MD;  Location: [facility];  Service: Obstetrics;  Laterality: N/A;  . CYSTOSCOPY WITH RETROGRADE PYELOGRAM, URETEROSCOPY AND STENT PLACEMENT Left 05/05/2014   Procedure: LEFT   URETEROSCOPY,  RETROGRADE PYELOGRAM,  AND STENT PLACEMENT, STONE REMOVAL;  Surgeon: Crist Fat, MD;  Location: [facility];  Service: Urology;  Laterality: Left;  . HOLMIUM LASER APPLICATION Left 05/05/2014   Procedure: HOLMIUM LASER APPLICATION;  Surgeon: Crist Fat, MD;  Location: [facility];  Service: Urology;  Laterality: Left;  . KNEE SURGERY     Social History   Socioeconomic History  . Marital status: Single    Spouse name: Not on file  . Number of children: Not on file  . Years of education: Not on file  . Highest education level: Not on file  Occupational History  . Not on file  Social Needs  . Financial resource strain: Not on file  . Food insecurity:    Worry: Not on file    Inability: Not on file  . Transportation needs:    Medical: Not on file    Non-medical: Not on file  Tobacco Use  . Smoking status: Former Smoker    Types: Cigars    Last attempt to quit: 09/24/2014    Years since quitting: 3.2  . Smokeless tobacco: Never Used  Substance and Sexual Activity  . Alcohol use: Not Currently    Comment: occasional/ not since pregnancy  . Drug use: No  . Sexual activity: Not Currently    Birth  control/protection: None  Lifestyle  . Physical activity:    Days per week: Not on file    Minutes per session: Not on file  . Stress: Not on file  Relationships  . Social connections:    Talks on phone: Not on file    Gets together: Not on file    Attends religious service: Not on file    Active member of club or organization: Not on file    Attends meetings of clubs or organizations: Not on file    Relationship status: Not on file  . Intimate partner violence:    Fear of current or ex partner: Not on file    Emotionally abused: Not on file    Physically abused: Not on file    Forced sexual activity: Not on file  Other Topics Concern  . Not on file  Social History Narrative  . Not on file   Family History  Problem Relation Age of Onset  . Anesthesia  problems Mother        post-op N/V, hard to wake up  . Arthritis Mother    No current facility-administered medications on file prior to encounter.    Current Outpatient Medications on File Prior to Encounter  Medication Sig Dispense Refill  . acetaminophen (TYLENOL) 325 MG tablet Take 650 mg by mouth every 6 (six) hours as needed for moderate pain or headache.     . folic acid (FOLVITE) 400 MCG tablet Take 400 mcg by mouth at bedtime.    Marland Kitchen oxyCODONE (OXY IR/ROXICODONE) 5 MG immediate release tablet Take 5 mg by mouth every 4 (four) hours as needed (for pain scale 4-7).    . phenazopyridine (PYRIDIUM) 200 MG tablet Take 1 tablet (200 mg total) by mouth 3 (three) times daily. (Patient not taking: Reported on 08/25/2015) 9 tablet 0   No Known Allergies  I have reviewed patient's Past Medical Hx, Surgical Hx, Family Hx, Social Hx, medications and allergies.   Review of Systems  Constitutional: Positive for appetite change (decreased). Negative for chills and fever.  Gastrointestinal: Positive for abdominal distention, abdominal pain, constipation and vomiting. Negative for diarrhea and nausea.  Genitourinary: Negative for dysuria, hematuria, pelvic pain, vaginal bleeding and vaginal discharge.  Musculoskeletal:       Neg for arm pain  Neurological: Positive for dizziness.    OBJECTIVE Patient Vitals for the past 24 hrs:  BP Temp Temp src Pulse Resp SpO2 Height Weight  01/07/18 1800 126/78 98.4 F (36.9 C) Oral (!) 110 15 99 % 5\' 1"  (1.549 m) 55.3 kg   Constitutional: Well-developed, well-nourished female in no acute distress.  Skin: Mucous membranes dry. Cardiovascular: Mild tachycardia Respiratory: normal rate and effort.  GI: Abd distended, non-tender, gravid appropriate for gestational age. Pos BS x 4 MS: Extremities nontender, no edema, normal ROM Neurologic: Alert and oriented x 4.  GU: Deferred  Fetal heart rate 151 by Doppler  LAB RESULTS Results for orders placed or  performed during the hospital encounter of 01/07/18 (from the past 24 hour(s))  Urinalysis, Routine w reflex microscopic     Status: Abnormal   Collection Time: 01/07/18  6:07 PM  Result Value Ref Range   Color, Urine YELLOW YELLOW   APPearance HAZY (A) CLEAR   Specific Gravity, Urine 1.040 (H) 1.005 - 1.030   pH 5.0 5.0 - 8.0   Glucose, UA NEGATIVE NEGATIVE mg/dL   Hgb urine dipstick NEGATIVE NEGATIVE   Bilirubin Urine NEGATIVE NEGATIVE   Ketones, ur  80 (A) NEGATIVE mg/dL   Protein, ur 30 (A) NEGATIVE mg/dL   Nitrite NEGATIVE NEGATIVE   Leukocytes, UA LARGE (A) NEGATIVE   RBC / HPF 0-5 0 - 5 RBC/hpf   WBC, UA 6-10 0 - 5 WBC/hpf   Bacteria, UA FEW (A) NONE SEEN   Squamous Epithelial / LPF 11-20 0 - 5   Mucus PRESENT   CBC     Status: Abnormal   Collection Time: 01/07/18 10:15 PM  Result Value Ref Range   WBC 8.1 4.0 - 10.5 K/uL   RBC 3.49 (L) 3.87 - 5.11 MIL/uL   Hemoglobin 10.3 (L) 12.0 - 15.0 g/dL   HCT 16.1 (L) 09.6 - 04.5 %   MCV 84.5 80.0 - 100.0 fL   MCH 29.5 26.0 - 34.0 pg   MCHC 34.9 30.0 - 36.0 g/dL   RDW 40.9 81.1 - 91.4 %   Platelets 268 150 - 400 K/uL   nRBC 0.0 0.0 - 0.2 %    IMAGING No results found.  MAU COURSE Orders Placed This Encounter  Procedures  . Urinalysis, Routine w reflex microscopic  . CBC  . Comprehensive metabolic panel  . Soap suds enema   Meds ordered this encounter  Medications  . promethazine (PHENERGAN) 25 mg in dextrose 5% lactated ringers 1,000 mL infusion  . lactated ringers bolus 1,000 mL  . enoxaparin (LOVENOX) injection 40 mg  . sertraline (ZOLOFT) tablet 25 mg  . sertraline (ZOLOFT) 25 MG tablet    Sig: Take 1-2 tablets (25-50 mg total) by mouth daily. Take 1 tablet (25 mg) per day x7 days then increase to 2 tablets (50 mg) per day    Dispense:  2 tablet    Refill:  0    Order Specific Question:   Supervising Provider    Answer:   Levie Heritage [[id number]]  . enoxaparin (LOVENOX) 40 MG/0.4ML injection    Sig: Inject 0.4  mLs (40 mg total) into the skin daily.    Dispense:  30 Syringe    Refill:  3    Order Specific Question:   Supervising Provider    Answer:   Levie Heritage [[id number]]  . promethazine (PHENERGAN) 25 MG tablet    Sig: Take 1 tablet (25 mg total) by mouth every 6 (six) hours as needed for nausea or vomiting.    Dispense:  30 tablet    Refill:  3    Order Specific Question:   Supervising Provider    Answer:   Levie Heritage [[id number]]  . ondansetron (ZOFRAN ODT) 8 MG disintegrating tablet    Sig: Take 1 tablet (8 mg total) by mouth every 8 (eight) hours as needed for nausea or vomiting.    Dispense:  20 tablet    Refill:  3    Order Specific Question:   Supervising Provider    Answer:   Levie Heritage [[id number]]  . polyethylene glycol powder (GLYCOLAX/MIRALAX) powder    Sig: Take 17 g by mouth daily as needed for moderate constipation.    Dispense:  500 g    Refill:  2    Order Specific Question:   Supervising Provider    Answer:   Levie Heritage [[id number]]   Discussed Hx, labs, exam w/ Dr. Vergie Living. Agrees w/ POC. New orders: Lovenox 40 subcu daily, CBC, CMP follow-up CBC on 01/11/2018 to monitor for HIT.   Care of patient turned over to Pearletha Alfred, CNM at 10:30 PM.  Katrinka Blazing, [place],  CNM 01/07/2018  10:19 PM    Pt feels better, declines another enema. DC'd home, aware of meds at pharmacy and POC

## 2018-01-08 LAB — COMPREHENSIVE METABOLIC PANEL
ALK PHOS: 35 U/L — AB (ref 38–126)
ALT: 13 U/L (ref 0–44)
AST: 20 U/L (ref 15–41)
Albumin: 3 g/dL — ABNORMAL LOW (ref 3.5–5.0)
Anion gap: 9 (ref 5–15)
BILIRUBIN TOTAL: 0.3 mg/dL (ref 0.3–1.2)
BUN: 7 mg/dL (ref 6–20)
CALCIUM: 8.5 mg/dL — AB (ref 8.9–10.3)
CO2: 20 mmol/L — ABNORMAL LOW (ref 22–32)
CREATININE: 0.59 mg/dL (ref 0.44–1.00)
Chloride: 104 mmol/L (ref 98–111)
Glucose, Bld: 137 mg/dL — ABNORMAL HIGH (ref 70–99)
Potassium: 3.1 mmol/L — ABNORMAL LOW (ref 3.5–5.1)
Sodium: 133 mmol/L — ABNORMAL LOW (ref 135–145)
Total Protein: 7.2 g/dL (ref 6.5–8.1)

## 2018-01-10 ENCOUNTER — Encounter (HOSPITAL_COMMUNITY): Payer: Self-pay

## 2018-01-10 ENCOUNTER — Emergency Department (HOSPITAL_COMMUNITY)
Admission: EM | Admit: 2018-01-10 | Discharge: 2018-01-12 | Disposition: A | Discharge status: Other Acute Inpatient Hospital | Payer: Medicaid Other | Attending: Emergency Medicine | Admitting: Emergency Medicine

## 2018-01-10 ENCOUNTER — Other Ambulatory Visit: Payer: Self-pay

## 2018-01-10 DIAGNOSIS — T50902A Poisoning by unspecified drugs, medicaments and biological substances, intentional self-harm, initial encounter: Secondary | ICD-10-CM | POA: Diagnosis not present

## 2018-01-10 DIAGNOSIS — Y999 Unspecified external cause status: Secondary | ICD-10-CM | POA: Insufficient documentation

## 2018-01-10 DIAGNOSIS — X838XXA Intentional self-harm by other specified means, initial encounter: Secondary | ICD-10-CM | POA: Diagnosis not present

## 2018-01-10 DIAGNOSIS — Y929 Unspecified place or not applicable: Secondary | ICD-10-CM | POA: Diagnosis not present

## 2018-01-10 DIAGNOSIS — Z046 Encounter for general psychiatric examination, requested by authority: Secondary | ICD-10-CM | POA: Diagnosis not present

## 2018-01-10 DIAGNOSIS — O99342 Other mental disorders complicating pregnancy, second trimester: Secondary | ICD-10-CM | POA: Diagnosis not present

## 2018-01-10 DIAGNOSIS — F329 Major depressive disorder, single episode, unspecified: Secondary | ICD-10-CM | POA: Diagnosis not present

## 2018-01-10 DIAGNOSIS — T1491XA Suicide attempt, initial encounter: Secondary | ICD-10-CM | POA: Diagnosis not present

## 2018-01-10 DIAGNOSIS — R45851 Suicidal ideations: Secondary | ICD-10-CM | POA: Insufficient documentation

## 2018-01-10 DIAGNOSIS — Z87891 Personal history of nicotine dependence: Secondary | ICD-10-CM | POA: Insufficient documentation

## 2018-01-10 DIAGNOSIS — Z79899 Other long term (current) drug therapy: Secondary | ICD-10-CM | POA: Insufficient documentation

## 2018-01-10 DIAGNOSIS — O9989 Other specified diseases and conditions complicating pregnancy, childbirth and the puerperium: Secondary | ICD-10-CM | POA: Diagnosis present

## 2018-01-10 DIAGNOSIS — Y9389 Activity, other specified: Secondary | ICD-10-CM | POA: Insufficient documentation

## 2018-01-10 DIAGNOSIS — Z3A14 14 weeks gestation of pregnancy: Secondary | ICD-10-CM | POA: Diagnosis not present

## 2018-01-10 HISTORY — DX: Depression, unspecified: F32.A

## 2018-01-10 HISTORY — DX: Major depressive disorder, single episode, unspecified: F32.9

## 2018-01-10 LAB — COMPREHENSIVE METABOLIC PANEL
ALBUMIN: 3.4 g/dL — AB (ref 3.5–5.0)
ALK PHOS: 42 U/L (ref 38–126)
ALT: 10 U/L (ref 0–44)
ANION GAP: 8 (ref 5–15)
AST: 23 U/L (ref 15–41)
BILIRUBIN TOTAL: 0.4 mg/dL (ref 0.3–1.2)
BUN: 7 mg/dL (ref 6–20)
CALCIUM: 8.8 mg/dL — AB (ref 8.9–10.3)
CO2: 19 mmol/L — ABNORMAL LOW (ref 22–32)
Chloride: 111 mmol/L (ref 98–111)
Creatinine, Ser: 0.58 mg/dL (ref 0.44–1.00)
GLUCOSE: 96 mg/dL (ref 70–99)
Potassium: 3.4 mmol/L — ABNORMAL LOW (ref 3.5–5.1)
Sodium: 138 mmol/L (ref 135–145)
TOTAL PROTEIN: 7.2 g/dL (ref 6.5–8.1)

## 2018-01-10 LAB — CBC
HEMATOCRIT: 35.8 % — AB (ref 36.0–46.0)
Hemoglobin: 11.4 g/dL — ABNORMAL LOW (ref 12.0–15.0)
MCH: 28.8 pg (ref 26.0–34.0)
MCHC: 31.8 g/dL (ref 30.0–36.0)
MCV: 90.4 fL (ref 80.0–100.0)
NRBC: 0 % (ref 0.0–0.2)
PLATELETS: 273 10*3/uL (ref 150–400)
RBC: 3.96 MIL/uL (ref 3.87–5.11)
RDW: 14.1 % (ref 11.5–15.5)
WBC: 8.7 10*3/uL (ref 4.0–10.5)

## 2018-01-10 LAB — ACETAMINOPHEN LEVEL: ACETAMINOPHEN (TYLENOL), SERUM: 131 ug/mL — AB (ref 10–30)

## 2018-01-10 LAB — RAPID URINE DRUG SCREEN, HOSP PERFORMED
Amphetamines: NOT DETECTED
BARBITURATES: NOT DETECTED
BENZODIAZEPINES: NOT DETECTED
COCAINE: NOT DETECTED
OPIATES: NOT DETECTED
Tetrahydrocannabinol: NOT DETECTED

## 2018-01-10 LAB — I-STAT BETA HCG BLOOD, ED (MC, WL, AP ONLY): I-stat hCG, quantitative: >2000 m[IU]/mL — ABNORMAL HIGH (ref <5)

## 2018-01-10 LAB — SALICYLATE LEVEL

## 2018-01-10 LAB — CBG MONITORING, ED: Glucose-Capillary: 86 mg/dL (ref 70–99)

## 2018-01-10 LAB — ETHANOL

## 2018-01-10 MED ORDER — SODIUM CHLORIDE 0.9 % IV BOLUS
1000.0000 mL | Freq: Once | INTRAVENOUS | Status: AC
  Administered 2018-01-10: 1000 mL via INTRAVENOUS

## 2018-01-10 NOTE — ED Provider Notes (Addendum)
[facility]-EMERGENCY DEPT Provider Note   CSN: [id number] Arrival date & time: 01/10/18  2142     History   Chief Complaint Chief Complaint  Patient presents with  . Drug Overdose    HPI Erin Jacobson is a 27 y.o. female.  HPI  27 year old female G3, P2 14 weeks 5 days pregnant presents today complaining of overdose.  She states that approximately 2 hours prior to evaluation with reports that he took an unknown amount of hydroxyzine and Tylenol approximately 15 500 mg tablets.  Reports a history of depression with previous suicide attempts.  Reports that her son knew that she overdosed and alerted someone to receive care Patient reports ingestion at 2000- conflicting report that ingestion was at 2100- hence poison control initially advised charcoal and 0100 acetaminophen- Past Medical History:  Diagnosis Date  . Complication of anesthesia   . Depression   . DVT of upper extremity (deep vein thrombosis) (HCC)   . GERD (gastroesophageal reflux disease)    no current meds for reflux  . Kidney stone     Patient Active Problem List   Diagnosis Date Noted  . Subchorionic hematoma 11/09/2017  . Trichimoniasis 11/08/2017  . Bacterial vaginitis 11/08/2017  . Abdominal pain affecting pregnancy 11/08/2017  . Premature rupture of membranes with onset of labor within 24 hours of rupture 06/11/2015  . DVT (deep vein thrombosis) in pregnancy 11/30/2014  . DVT during pregnancy, antepartum 11/30/2014  . Ectopic pregnancy with intrauterine pregnancy 11/19/2014  . Ectopic pregnancy 11/18/2014    Past Surgical History:  Procedure Laterality Date  . APPENDECTOMY  04/01/2004   ex. lap.  . CESAREAN SECTION N/A 06/11/2015   Procedure: CESAREAN SECTION;  Surgeon: Marlow Baars, MD;  Location: [facility];  Service: Obstetrics;  Laterality: N/A;  . CYSTOSCOPY WITH RETROGRADE PYELOGRAM, URETEROSCOPY AND STENT PLACEMENT Left 05/05/2014   Procedure: LEFT  URETEROSCOPY,  RETROGRADE  PYELOGRAM,  AND STENT PLACEMENT, STONE REMOVAL;  Surgeon: Crist Fat, MD;  Location: [facility];  Service: Urology;  Laterality: Left;  . HOLMIUM LASER APPLICATION Left 05/05/2014   Procedure: HOLMIUM LASER APPLICATION;  Surgeon: Crist Fat, MD;  Location: [facility];  Service: Urology;  Laterality: Left;  . KNEE SURGERY       OB History    Gravida  3   Para  2   Term  2   Preterm      AB      Living  2     SAB      TAB      Ectopic      Multiple  0   Live Births  2            Home Medications    Prior to Admission medications   Medication Sig Start Date End Date Taking? Authorizing Provider  acetaminophen (TYLENOL) 325 MG tablet Take 650 mg by mouth every 6 (six) hours as needed for moderate pain or headache.     [provider]  enoxaparin (LOVENOX) 40 MG/0.4ML injection Inject 0.4 mLs (40 mg total) into the skin daily. 01/07/18   Katrinka Blazing, [place], CNM  folic acid (FOLVITE) 400 MCG tablet Take 400 mcg by mouth at bedtime.    [provider]  ondansetron (ZOFRAN ODT) 8 MG disintegrating tablet Take 1 tablet (8 mg total) by mouth every 8 (eight) hours as needed for nausea or vomiting. 01/07/18   Katrinka Blazing, [place], CNM  polyethylene glycol powder (GLYCOLAX/MIRALAX) powder Take 17 g by  mouth daily as needed for moderate constipation. 01/07/18   Katrinka Blazing, [place], CNM  promethazine (PHENERGAN) 25 MG tablet Take 1 tablet (25 mg total) by mouth every 6 (six) hours as needed for nausea or vomiting. 01/07/18   Katrinka Blazing, [place], CNM  sertraline (ZOLOFT) 25 MG tablet Take 1-2 tablets (25-50 mg total) by mouth daily. Take 1 tablet (25 mg) per day x7 days then increase to 2 tablets (50 mg) per day 01/07/18   Dorathy Kinsman, CNM    Family History Family History  Problem Relation Age of Onset  . Anesthesia problems Mother        post-op N/V, hard to wake up  . Arthritis Mother     Social History Social History   Tobacco Use  . Smoking status: Former  Smoker    Types: Cigars    Last attempt to quit: 09/24/2014    Years since quitting: 3.2  . Smokeless tobacco: Never Used  Substance Use Topics  . Alcohol use: Not Currently    Comment: occasional/ not since pregnancy  . Drug use: No     Allergies   Patient has no known allergies.   Review of Systems Review of Systems   Physical Exam Updated Vital Signs BP 121/67 (BP Location: Left Arm)   Pulse (!) 103   Temp 98.2 F (36.8 C) (Oral)   Resp 15   LMP 09/30/2017 (Exact Date)   SpO2 100%   Physical Exam  Constitutional: She appears well-developed and well-nourished.  HENT:  Head: Normocephalic and atraumatic.  Right Ear: External ear normal.  Left Ear: External ear normal.  Nose: Nose normal.  Mouth/Throat: Oropharynx is clear and moist.  Eyes: Pupils are equal, round, and reactive to light. EOM are normal.  Neck: Normal range of motion. Neck supple.  Cardiovascular: Normal rate, regular rhythm, normal heart sounds and intact distal pulses.  Pulmonary/Chest: Effort normal and breath sounds normal.  Abdominal: Soft. Bowel sounds are normal.  Genitourinary:  Genitourinary Comments: Uterus enlarged c.w. Dates, nontender  Nursing note and vitals reviewed.    ED Treatments / Results  Labs (all labs ordered are listed, but only abnormal results are displayed) Labs Reviewed  COMPREHENSIVE METABOLIC PANEL  ETHANOL  SALICYLATE LEVEL  CBC  RAPID URINE DRUG SCREEN, HOSP PERFORMED  CBG MONITORING, ED  I-STAT BETA HCG BLOOD, ED ([facility], [facility], [facility] ONLY)    EKG EKG Interpretation  Date/Time:  Sunday January 10 2018 21:55:41 EDT Ventricular Rate:  103 PR Interval:    QRS Duration: 83 QT Interval:  355 QTC Calculation: 465 R Axis:   88 Text Interpretation:  Sinus tachycardia Borderline T wave abnormalities No acute changes No old tracing to compare Confirmed by Derwood Kaplan [phone]) on 01/10/2018 11:53:47 PM   Radiology No results found.  Procedures Procedures  (including critical care time)  Medications Ordered in ED Medications - No data to display   Initial Impression / Assessment and Plan / ED Course  I have reviewed the triage vital signs and the nursing notes.  Pertinent labs & imaging results that were available during my care of the patient were reviewed by me and considered in my medical decision making (see chart for details).   Patient with report of od of tylenol and hydrocyzine at 2000- initial poison control recommendations charcoal at 4 hour tylenol level.  Plan no charcoal as at almost 3 hours post ingestion now and will get acetaminophen level now and at mn. Patient with pregnancy at 14 weeks 4 days  Will ivc now  11:28 PM RN reports bp 85/50- one liter ns ordered Iv fluids infusing sbp 106  First tylenol at 131 Order placed for acetaminophen draw at 0000 Discussed with Dr. Shyrl Numbers and he assumes care of patient CRITICAL CARE Performed by: Margarita Grizzle Total critical care time: 45 minutes Critical care time was exclusive of separately billable procedures and treating other patients. Critical care was necessary to treat or prevent imminent or life-threatening deterioration. Critical care was time spent personally by me on the following activities: development of treatment plan with patient and/or surrogate as well as nursing, discussions with consultants, evaluation of patient's response to treatment, examination of patient, obtaining history from patient or surrogate, ordering and performing treatments and interventions, ordering and review of laboratory studies, ordering and review of radiographic studies, pulse oximetry and re-evaluation of patient's condition.  Final Clinical Impressions(s) / ED Diagnoses   Final diagnoses:  Intentional drug overdose, initial encounter (HCC)  [redacted] weeks gestation of pregnancy    ED Discharge Orders    None       Margarita Grizzle, MD 01/10/18 1610    Margarita Grizzle, MD 01/11/18  0001

## 2018-01-10 NOTE — ED Notes (Signed)
Dr. Rosalia Hammers made aware of patient BP

## 2018-01-10 NOTE — ED Notes (Signed)
Bed: RESB Expected date:  Expected time:  Means of arrival:  Comments: 27 yo F/ OD

## 2018-01-10 NOTE — ED Triage Notes (Addendum)
Pt BIB [facility]. EMS bring pt in after suicide attempt with unknown amounts of hydroxyzine and acetominophen 500mg  tablets, around 2040 tonight. Pt is [redacted] weeks pregnant with no prior OB care. Pt has hx of depression and suicide attempt in the past. EMS states pts mom threw away the remaining tablets prior to EMS arrival.   Pt states in ED she has had thoughts of hurting herself recently due to "a lot of things". She states she doesn't have suicidal thoughts currently but was at the time of the overdose.

## 2018-01-10 NOTE — ED Provider Notes (Addendum)
  Physical Exam  BP 106/69   Pulse 87   Temp 98.2 F (36.8 C) (Oral)   Resp (!) 22   LMP 09/30/2017 (Exact Date)   SpO2 100%   Physical Exam  ED Course/Procedures     Procedures  MDM   Assuming care of patient from Dr. Rosalia Hammers.   Patient in the ED for overdose with tylenol - 15 x 500 mg tylenol and old depression medications (6-7 tabs). Workup thus far shows normal labs. Pt is pregnant and in 1st trimester.   Concerning findings are as following: tylenol level at arrival is high.  Important pending results are 4 hour tylenol 12:00 am.  According to Dr. Rosalia Hammers, plan is to f/u on tylenol and monitor hemodynamics closely.   Patient had no complains, no concerns from the nursing side. Will continue to monitor. Pt is IVC.   Derwood Kaplan, MD 01/10/18 2354     1:51 AM Tylenol level is 130 followed by 73. Both levels are below the treatment level. She is medically cleared. No need for NAC. BP is slightly. Could be related to the fact that she is pregnant.   Derwood Kaplan, MD 01/11/18 0154  3:43 AM Lactic acid is normal.  Blood pressure continues to rebound between 80s and 90s systolic with map in the low 60s.  Patient does not have any dizziness, lightheadedness, chest pain, shortness of breath.  Heart rate has been in the 90s and low 100s.  With her lactic acid being normal, there does not appear to be any perfusion issues.  Creatinine is also normal.  Plan is to continue watching the blood pressure for now.  If the BP stays stable or patient stays asymptomatic then we will proceed with medical clearance in the morning.   CRITICAL CARE Performed by: Shauntee Karp   Total critical care time: 36 minutes  Critical care time was exclusive of separately billable procedures and treating other patients.  Critical care was necessary to treat or prevent imminent or life-threatening deterioration.  Critical care was time spent personally by me on the following activities:  development of treatment plan with patient and/or surrogate as well as nursing, discussions with consultants, evaluation of patient's response to treatment, examination of patient, obtaining history from patient or surrogate, ordering and performing treatments and interventions, ordering and review of laboratory studies, ordering and review of radiographic studies, pulse oximetry and re-evaluation of patient's condition.  6:37 AM  Last blood pressure has been 90 and then 100 SBP.  She is medically cleared for psych Rhys Martini    Derwood Kaplan, MD 01/11/18 [phone]

## 2018-01-11 LAB — I-STAT CG4 LACTIC ACID, ED: LACTIC ACID, VENOUS: 0.62 mmol/L (ref 0.5–1.9)

## 2018-01-11 LAB — ACETAMINOPHEN LEVEL: Acetaminophen (Tylenol), Serum: 73 ug/mL — ABNORMAL HIGH (ref 10–30)

## 2018-01-11 MED ORDER — ENOXAPARIN SODIUM 40 MG/0.4ML ~~LOC~~ SOLN
40.0000 mg | SUBCUTANEOUS | Status: DC
  Filled 2018-01-11 (×2): qty 0.4

## 2018-01-11 MED ORDER — PROMETHAZINE HCL 25 MG PO TABS
25.0000 mg | ORAL_TABLET | Freq: Four times a day (QID) | ORAL | Status: DC | PRN
  Administered 2018-01-11 – 2018-01-12 (×2): 25 mg via ORAL
  Filled 2018-01-11 (×2): qty 1

## 2018-01-11 MED ORDER — SERTRALINE HCL 50 MG PO TABS: 25.0000 mg | ORAL_TABLET | Freq: Every day | ORAL | Status: DC

## 2018-01-11 MED ORDER — SERTRALINE HCL 50 MG PO TABS
25.0000 mg | ORAL_TABLET | Freq: Every day | ORAL | Status: DC
  Administered 2018-01-11 – 2018-01-12 (×2): 25 mg via ORAL
  Filled 2018-01-11 (×2): qty 1

## 2018-01-11 MED ORDER — SERTRALINE HCL 50 MG PO TABS: 50.0000 mg | ORAL_TABLET | Freq: Every day | ORAL | Status: DC

## 2018-01-11 NOTE — Progress Notes (Addendum)
CSW received report from Tom with TTS that pt intentionally overdosed in front of child and is currently pregnant. CSW spoke with [name] from the [facility] CPS to make this report. CSW provided what information given to CSW via Tom and pt's chart to [name] at this time.    Erin Jacobson, MSW, LCSW-A Emergency Department Clinical Social Worker [phone]

## 2018-01-11 NOTE — ED Notes (Signed)
Pt taking a shower 

## 2018-01-11 NOTE — ED Notes (Signed)
This nurse assuming care of pt at this time, pt guarded, withdrawn, forwards little with this nurse. Denies SI. Encouragement and support provided. Special checks q 15 mins in place for safety, video monitoring in place. Will continue to monitor.

## 2018-01-11 NOTE — ED Notes (Signed)
Called pharmacy to verify Lovenox so can be given.

## 2018-01-11 NOTE — ED Notes (Signed)
Patient wanded by Security  

## 2018-01-11 NOTE — ED Notes (Signed)
Social services at bedside.

## 2018-01-11 NOTE — BH Assessment (Signed)
[facility] Assessment Progress Note Case was staffed with Lucianne Muss MD, Cresenciano Genre who also evaluated patient and recommended a inpatient admission to assist with stabilization as appropriate bed placement is investigated. Upon chart review CSW received report from staff that patient intentionally overdosed in front of child and is currently pregnant. CSW spoke with [name] from the [facility] CPS to make this report. CSW provided what information given to CSW and patient's chart to [name] at this time.

## 2018-01-11 NOTE — BH Assessment (Addendum)
Assessment Note  Erin Jacobson is an 27 y.o. female that presents this date with IVC. Per IVC: "Respondent is a harm to herself and overdosed earlier this date due to depression." Patient denies any H/I or AVH. Patient admits to active S/I although will not elaborate on the incident that occurred earlier. Patient renders limited information and states she "doesn't want to talk to anyone." This writer explains to patient that information needs to be obtained in order to assist with treatment planning. Patient does not respond and refuses to participate in the assessment. Information to complete assessment was obtained from prior history and notes. Per chart review patient denies any SA history. Patient's UDS is negative and BAL is less than 5. Patient did report earlier that she has had multiple attempts at self harm although a full history cannot be gathered. Per notes, "Patient is pregnant (2nd trimester) with expected delivery date on July 07 2018. Patient presents today complaining of overdose. She states that approximately 2 hours prior to evaluation she took an unknown amount of hydroxyzine and Tylenol approximately 15 500 mg tablets. Reports a history of depression with previous suicide attempts. Reports that her son knew that she overdosed and alerted someone to receive care". Patient denies having a current OP provider or being on any MH medications associated with depression. Case was staffed with Lucianne Muss MD, Cresenciano Genre who also evaluated patient and recommended a inpatient admission to assist with stabilization as appropriate bed placement is investigated. Upon chart review CSW received report from staff that patient intentionally overdosed in front of child and is currently pregnant. CSW spoke with [name] from the [facility] CPS to make this report. CSW provided what information given to CSW and patient's chart to [name] at this time.   Diagnosis: F33.2 MDD recurrent without psychotic symptoms,  severe    Past Medical History:  Past Medical History:  Diagnosis Date  . Complication of anesthesia   . Depression   . DVT of upper extremity (deep vein thrombosis) (HCC)   . GERD (gastroesophageal reflux disease)    no current meds for reflux  . Kidney stone     Past Surgical History:  Procedure Laterality Date  . APPENDECTOMY  04/01/2004   ex. lap.  . CESAREAN SECTION N/A 06/11/2015   Procedure: CESAREAN SECTION;  Surgeon: Marlow Baars, MD;  Location: [facility];  Service: Obstetrics;  Laterality: N/A;  . CYSTOSCOPY WITH RETROGRADE PYELOGRAM, URETEROSCOPY AND STENT PLACEMENT Left 05/05/2014   Procedure: LEFT  URETEROSCOPY,  RETROGRADE PYELOGRAM,  AND STENT PLACEMENT, STONE REMOVAL;  Surgeon: Crist Fat, MD;  Location: [facility];  Service: Urology;  Laterality: Left;  . HOLMIUM LASER APPLICATION Left 05/05/2014   Procedure: HOLMIUM LASER APPLICATION;  Surgeon: Crist Fat, MD;  Location: [facility];  Service: Urology;  Laterality: Left;  . KNEE SURGERY      Family History:  Family History  Problem Relation Age of Onset  . Anesthesia problems Mother        post-op N/V, hard to wake up  . Arthritis Mother     Social History:  reports that she quit smoking about 3 years ago. Her smoking use included cigars. She has never used smokeless tobacco. She reports that she drank alcohol. She reports that she does not use drugs.  Additional Social History:  Alcohol / Drug Use Pain Medications: See MAR Prescriptions: See MAR Over the Counter: See MAR History of alcohol / drug use?: No history of alcohol /  drug abuse Longest period of sobriety (when/how long): NA Negative Consequences of Use: (NA) Withdrawal Symptoms: (NA)  CIWA: CIWA-Ar BP: 103/70 Pulse Rate: 100 COWS:    Allergies: No Known Allergies  Home Medications:  (Not in a hospital admission)  OB/GYN Status:  Patient's last menstrual period was 09/30/2017 (exact date).  General Assessment Data Location of  Assessment: [name] Assessment: In system Is this a Tele or Face-to-Face Assessment?: Face-to-Face Is this an Initial Assessment or a Re-assessment for this encounter?: Initial Assessment Patient Accompanied by:: (NA) Language Other than English: No Living Arrangements: Other (Comment)(Other realitives) What gender do you identify as?: Female Marital status: Single Maiden name: Eskridge Pregnancy Status: Yes (Comment: include estimated delivery date) Living Arrangements: Other relatives Can pt return to current living arrangement?: Yes Admission Status: Involuntary Petitioner: Other(Outside provider MD) Is patient capable of signing voluntary admission?: Yes Referral Source: MD Insurance type: Medicaid  Medical Screening Exam [name] Walk-in ONLY) Medical Exam completed: Yes  Crisis Care Plan Living Arrangements: Other relatives Legal Guardian: (NA) Name of Psychiatrist: None Name of Therapist: None  Education Status Is patient currently in school?: No Is the patient employed, unemployed or receiving disability?: Unemployed  Risk to self with the past 6 months Suicidal Ideation: Yes-Currently Present Has patient been a risk to self within the past 6 months prior to admission? : No Suicidal Intent: Yes-Currently Present Has patient had any suicidal intent within the past 6 months prior to admission? : No Is patient at risk for suicide?: Yes Suicidal Plan?: Yes-Currently Present Has patient had any suicidal plan within the past 6 months prior to admission? : No Specify Current Suicidal Plan: Overdose Access to Means: Yes Specify Access to Suicidal Means: Overdose on OTC meds What has been your use of drugs/alcohol within the last 12 months?: Denies Previous Attempts/Gestures: Yes How many times?: (Multiple ) Other Self Harm Risks: (UTA) Triggers for Past Attempts: Unknown Intentional Self Injurious Behavior: None Family Suicide History: No Recent stressful life event(s):  (UTA) Persecutory voices/beliefs?: Rich Reining) Depression: (UTA) Depression Symptoms: (UTA) Substance abuse history and/or treatment for substance abuse?: (UTA) Suicide prevention information given to non-admitted patients: (UTA)  Risk to Others within the past 6 months Homicidal Ideation: No Does patient have any lifetime risk of violence toward others beyond the six months prior to admission? : No Thoughts of Harm to Others: No Current Homicidal Intent: No Current Homicidal Plan: No Access to Homicidal Means: No Identified Victim: NA History of harm to others?: No Assessment of Violence: None Noted Violent Behavior Description: NA Does patient have access to weapons?: No Criminal Charges Pending?: No Does patient have a court date: No Is patient on probation?: No  Psychosis Hallucinations: None noted Delusions: None noted  Mental Status Report Appearance/Hygiene: In scrubs Eye Contact: Unable to Assess Motor Activity: Freedom of movement Speech: Soft, Slow Level of Consciousness: Drowsy, Irritable Mood: Depressed Affect: Sad Anxiety Level: Minimal Thought Processes: Unable to Assess Judgement: Unable to Assess Orientation: Unable to assess Obsessive Compulsive Thoughts/Behaviors: Unable to Assess  Cognitive Functioning Concentration: Unable to Assess Memory: Unable to Assess Is patient IDD: No Insight: Unable to Assess Impulse Control: Unable to Assess Appetite: (UTA) Have you had any weight changes? : (UTA) Sleep: (UTA) Total Hours of Sleep: (UTA) Vegetative Symptoms: (UTA)  ADLScreening [facility] Assessment Services) Patient's cognitive ability adequate to safely complete daily activities?: Yes Patient able to express need for assistance with ADLs?: Yes Independently performs ADLs?: Yes (appropriate for developmental age)  Prior  Inpatient Therapy Prior Inpatient Therapy: Yes Prior Therapy Dates: (Pt is unsure of date or location) Prior Therapy  Facilty/Provider(s): UTA Reason for Treatment: UTA  Prior Outpatient Therapy Prior Outpatient Therapy: No Does patient have an ACCT team?: No Does patient have Intensive In-House Services?  : No Does patient have [facility] services? : No Does patient have P4CC services?: No  ADL Screening (condition at time of admission) Patient's cognitive ability adequate to safely complete daily activities?: Yes Is the patient deaf or have difficulty hearing?: No Does the patient have difficulty seeing, even when wearing glasses/contacts?: No Does the patient have difficulty concentrating, remembering, or making decisions?: No Patient able to express need for assistance with ADLs?: Yes Does the patient have difficulty dressing or bathing?: No Independently performs ADLs?: Yes (appropriate for developmental age) Does the patient have difficulty walking or climbing stairs?: No Weakness of Legs: None Weakness of Arms/Hands: None  Home Assistive Devices/Equipment Home Assistive Devices/Equipment: None  Therapy Consults (therapy consults require a physician order) PT Evaluation Needed: No OT Evalulation Needed: No SLP Evaluation Needed: No Abuse/Neglect Assessment (Assessment to be complete while patient is alone) Physical Abuse: Denies Verbal Abuse: Denies Sexual Abuse: Denies Exploitation of patient/patient's resources: Denies Self-Neglect: Denies Values / Beliefs Cultural Requests During Hospitalization: None Spiritual Requests During Hospitalization: None Consults Spiritual Care Consult Needed: No Social Work Consult Needed: No Merchant navy officer (For Healthcare) Does Patient Have a Medical Advance Directive?: No Would patient like information on creating a medical advance directive?: No - Patient declined          Disposition: Case was staffed with Lucianne Muss MD, Shaune Pollack DNP who also evaluated patient and recommended a inpatient admission to assist with stabilization as appropriate bed  placement is investigated.    Disposition Initial Assessment Completed for this Encounter: Yes Disposition of Patient: Admit Type of inpatient treatment program: Adult Patient refused recommended treatment: Yes Type of treatment offered and refused: In-patient Other disposition(s): (Pt is with IVC to be admitted) Mode of transportation if patient is discharged?: Loreli Slot)  On Site Evaluation by:   Reviewed with Physician:    Alfredia Ferguson 01/11/2018 10:35 AM

## 2018-01-11 NOTE — ED Notes (Signed)
Pt alert in room. Pt stated her aunt visited and they had a pleasant visit. Pt stated she previously had SI but denies at present. Pt contracts for safety. Pt is flat but cooperative. Pt denies needs. No distress noted. Will continue to monitor.

## 2018-01-11 NOTE — ED Notes (Signed)
This nurse encouraged pt to take ordered lovenox that she previously refused, pt continues to refuse.

## 2018-01-11 NOTE — ED Notes (Signed)
Pt has been resting on her right side, BP cuff is on left arm.

## 2018-01-11 NOTE — BH Assessment (Signed)
[facility] Assessment Progress Note  Per Nelly Rout, MD, this pt requires psychiatric hospitalization at this time.  Pt presents under IVC initiated by pt's father, and upheld by Dr Lucianne Muss.  The following facilities have been contacted to seek placement for this pt, with results as noted:  Beds available, information sent, decision pending:  [place] [facility]   At capacity:  [facility] [place], [place] [facility] Coordinator [phone]

## 2018-01-11 NOTE — ED Notes (Signed)
Visitor at bedside.

## 2018-01-11 NOTE — Progress Notes (Signed)
CSW received phone call from [facility]. Pt accepted to go to [facility] tomorrow after 10 am.   Accepting Provider: Dr. Loyola Mast Number of Report: [phone]  [address], [facility]  [phone]

## 2018-01-11 NOTE — ED Notes (Signed)
Pt oriented to room and unit.  Pt is calm and cooperative.  Pt denies S/I and contracts for safety.  15 minute checks and video monitoring in place.

## 2018-01-11 NOTE — ED Notes (Signed)
Spoke to poison control, states that they are closing her case.

## 2018-01-12 NOTE — ED Notes (Signed)
Pt was discharged safely with [facility].  Pt was calm and cooperative.  All belongings were sent with patient.

## 2018-02-11 ENCOUNTER — Other Ambulatory Visit: Payer: Self-pay

## 2018-02-11 ENCOUNTER — Encounter (HOSPITAL_COMMUNITY): Payer: Self-pay | Admitting: *Deleted

## 2018-02-11 ENCOUNTER — Inpatient Hospital Stay (HOSPITAL_COMMUNITY)
Admission: AD | Admit: 2018-02-11 | Discharge: 2018-02-11 | Disposition: A | Discharge status: Home / Self Care | Payer: Medicaid Other | Source: Ambulatory Visit | Attending: Obstetrics & Gynecology | Admitting: Obstetrics & Gynecology

## 2018-02-11 DIAGNOSIS — Z8759 Personal history of other complications of pregnancy, childbirth and the puerperium: Secondary | ICD-10-CM

## 2018-02-11 DIAGNOSIS — Z3A19 19 weeks gestation of pregnancy: Secondary | ICD-10-CM | POA: Diagnosis not present

## 2018-02-11 DIAGNOSIS — Z87891 Personal history of nicotine dependence: Secondary | ICD-10-CM | POA: Insufficient documentation

## 2018-02-11 DIAGNOSIS — Z7982 Long term (current) use of aspirin: Secondary | ICD-10-CM | POA: Diagnosis not present

## 2018-02-11 DIAGNOSIS — N898 Other specified noninflammatory disorders of vagina: Secondary | ICD-10-CM | POA: Diagnosis present

## 2018-02-11 DIAGNOSIS — B9689 Other specified bacterial agents as the cause of diseases classified elsewhere: Secondary | ICD-10-CM | POA: Insufficient documentation

## 2018-02-11 DIAGNOSIS — N760 Acute vaginitis: Secondary | ICD-10-CM | POA: Diagnosis not present

## 2018-02-11 DIAGNOSIS — O23592 Infection of other part of genital tract in pregnancy, second trimester: Secondary | ICD-10-CM | POA: Insufficient documentation

## 2018-02-11 DIAGNOSIS — O26892 Other specified pregnancy related conditions, second trimester: Secondary | ICD-10-CM

## 2018-02-11 DIAGNOSIS — Z86718 Personal history of other venous thrombosis and embolism: Secondary | ICD-10-CM | POA: Insufficient documentation

## 2018-02-11 HISTORY — DX: Other specified postprocedural states: R11.2

## 2018-02-11 HISTORY — DX: Unspecified infectious disease: B99.9

## 2018-02-11 HISTORY — DX: Other specified postprocedural states: Z98.890

## 2018-02-11 LAB — URINALYSIS, ROUTINE W REFLEX MICROSCOPIC
BILIRUBIN URINE: NEGATIVE
Glucose, UA: NEGATIVE mg/dL
KETONES UR: NEGATIVE mg/dL
Nitrite: NEGATIVE
Protein, ur: 30 mg/dL — AB
SPECIFIC GRAVITY, URINE: 1.025 (ref 1.005–1.030)
pH: 6 (ref 5.0–8.0)

## 2018-02-11 LAB — URINALYSIS, MICROSCOPIC (REFLEX)

## 2018-02-11 LAB — WET PREP, GENITAL
SPERM: NONE SEEN
Trich, Wet Prep: NONE SEEN
YEAST WET PREP: NONE SEEN

## 2018-02-11 MED ORDER — METRONIDAZOLE 500 MG PO TABS: 500.0000 mg | ORAL_TABLET | Freq: Two times a day (BID) | ORAL | 0 refills | Status: DC

## 2018-02-11 MED ORDER — ASPIRIN 81 MG PO CHEW: 81.0000 mg | CHEWABLE_TABLET | Freq: Every day | ORAL | 5 refills | Status: DC

## 2018-02-11 NOTE — MAU Note (Signed)
Thinks she has a yeast infection, has a d/c - has slight odor.  She and partner have been treated for trich.

## 2018-02-11 NOTE — MAU Provider Note (Signed)
Chief Complaint: Vaginal Discharge   First Provider Initiated Contact with Patient 02/11/18 1540     SUBJECTIVE HPI: Erin Jacobson is a 27 y.o. G3P2002 at [redacted]w[redacted]d who presents to Maternity Admissions reporting vaginal discharge. Her and her partner were treated for trich in August. Has had intercourse with him since then. Has had increase in foul smelling vaginal discharge for the last 3 weeks. Denies itching/irritation. Denies abdominal pain or vaginal bleeding.    Past Medical History:  Diagnosis Date  . Complication of anesthesia   . Depression    on meds, doing ok  . DVT of upper extremity (deep vein thrombosis) (HCC)   . GERD (gastroesophageal reflux disease)    no current meds for reflux  . Infection    UTI  . Kidney stone   . PONV (postoperative nausea and vomiting)    OB History  Gravida Para Term Preterm AB Living  3 2 2     2   SAB TAB Ectopic Multiple Live Births        0 2    # Outcome Date GA Lbr Len/2nd Weight Sex Delivery Anes PTL Lv  3 Current           2 Term 06/11/15 [redacted]w[redacted]d 30:55 / 04:20 3015 g M CS-LTranv EPI  LIV  1 Term 12/14/09 [redacted]w[redacted]d  2353 g M Vag-Spont   LIV   Past Surgical History:  Procedure Laterality Date  . APPENDECTOMY  04/01/2004   ex. lap.  . CESAREAN SECTION N/A 06/11/2015   Procedure: CESAREAN SECTION;  Surgeon: Marlow Baars, MD;  Location: [facility];  Service: Obstetrics;  Laterality: N/A;  . CYSTOSCOPY WITH RETROGRADE PYELOGRAM, URETEROSCOPY AND STENT PLACEMENT Left 05/05/2014   Procedure: LEFT  URETEROSCOPY,  RETROGRADE PYELOGRAM,  AND STENT PLACEMENT, STONE REMOVAL;  Surgeon: Crist Fat, MD;  Location: [facility];  Service: Urology;  Laterality: Left;  . HOLMIUM LASER APPLICATION Left 05/05/2014   Procedure: HOLMIUM LASER APPLICATION;  Surgeon: Crist Fat, MD;  Location: [facility];  Service: Urology;  Laterality: Left;  . KNEE SURGERY     Social History   Socioeconomic History  . Marital status: Single    Spouse name: Not on file  .  Number of children: Not on file  . Years of education: Not on file  . Highest education level: Not on file  Occupational History  . Not on file  Social Needs  . Financial resource strain: Not on file  . Food insecurity:    Worry: Not on file    Inability: Not on file  . Transportation needs:    Medical: Not on file    Non-medical: Not on file  Tobacco Use  . Smoking status: Former Smoker    Types: Cigars    Last attempt to quit: 09/24/2014    Years since quitting: 3.3  . Smokeless tobacco: Never Used  Substance and Sexual Activity  . Alcohol use: Not Currently    Comment: occasional/ not since pregnancy  . Drug use: No  . Sexual activity: Yes    Birth control/protection: None  Lifestyle  . Physical activity:    Days per week: Not on file    Minutes per session: Not on file  . Stress: Not on file  Relationships  . Social connections:    Talks on phone: Not on file    Gets together: Not on file    Attends religious service: Not on file    Active member of club or organization:  Not on file    Attends meetings of clubs or organizations: Not on file    Relationship status: Not on file  . Intimate partner violence:    Fear of current or ex partner: Not on file    Emotionally abused: Not on file    Physically abused: Not on file    Forced sexual activity: Not on file  Other Topics Concern  . Not on file  Social History Narrative  . Not on file   Family History  Problem Relation Age of Onset  . Anesthesia problems Mother        post-op N/V, hard to wake up  . Arthritis Mother    No current facility-administered medications on file prior to encounter.    Current Outpatient Medications on File Prior to Encounter  Medication Sig Dispense Refill  . acetaminophen (TYLENOL) 325 MG tablet Take 650 mg by mouth every 6 (six) hours as needed for moderate pain or headache.      No Known Allergies  I have reviewed patient's Past Medical Hx, Surgical Hx, Family Hx, Social Hx,  medications and allergies.   Review of Systems  Constitutional: Negative.   Gastrointestinal: Negative.   Genitourinary: Positive for vaginal discharge. Negative for vaginal bleeding.    OBJECTIVE Patient Vitals for the past 24 hrs:  BP Temp Temp src Pulse Resp SpO2 Weight  02/11/18 1659 124/76 - - (!) 115 - - -  02/11/18 1509 133/69 98.6 F (37 C) Oral (!) 115 16 98 % 59.8 kg   Constitutional: Well-developed, well-nourished female in no acute distress.  Cardiovascular: normal rate & rhythm, no murmur Respiratory: normal rate and effort. Lung sounds clear throughout GI: Abd soft, non-tender, Pos BS x 4. No guarding or rebound tenderness MS: Extremities nontender, no edema, normal ROM Neurologic: Alert and oriented x 4.  GU:     SPECULUM EXAM: NEFG, moderate amount of frothy yellow discharge, no blood noted, cervix clean      LAB RESULTS Results for orders placed or performed during the hospital encounter of 02/11/18 (from the past 24 hour(s))  Urinalysis, Routine w reflex microscopic     Status: Abnormal   Collection Time: 02/11/18  3:25 PM  Result Value Ref Range   Color, Urine YELLOW YELLOW   APPearance TURBID (A) CLEAR   Specific Gravity, Urine 1.025 1.005 - 1.030   pH 6.0 5.0 - 8.0   Glucose, UA NEGATIVE NEGATIVE mg/dL   Hgb urine dipstick TRACE (A) NEGATIVE   Bilirubin Urine NEGATIVE NEGATIVE   Ketones, ur NEGATIVE NEGATIVE mg/dL   Protein, ur 30 (A) NEGATIVE mg/dL   Nitrite NEGATIVE NEGATIVE   Leukocytes, UA LARGE (A) NEGATIVE  Urinalysis, Microscopic (reflex)     Status: Abnormal   Collection Time: 02/11/18  3:25 PM  Result Value Ref Range   RBC / HPF 0-5 0 - 5 RBC/hpf   WBC, UA 11-20 0 - 5 WBC/hpf   Bacteria, UA MANY (A) NONE SEEN   Squamous Epithelial / LPF 21-50 0 - 5   Crystals CA OXALATE CRYSTALS (A) NEGATIVE  Wet prep, genital     Status: Abnormal   Collection Time: 02/11/18  3:50 PM  Result Value Ref Range   Yeast Wet Prep HPF POC NONE SEEN NONE SEEN    Trich, Wet Prep NONE SEEN NONE SEEN   Clue Cells Wet Prep HPF POC PRESENT (A) NONE SEEN   WBC, Wet Prep HPF POC MANY (A) NONE SEEN   Sperm NONE SEEN  IMAGING No results found.  MAU COURSE Orders Placed This Encounter  Procedures  . Wet prep, genital  . Culture, OB Urine  . Urinalysis, Routine w reflex microscopic  . Urinalysis, Microscopic (reflex)  . Discharge patient   Meds ordered this encounter  Medications  . aspirin 81 MG chewable tablet    Sig: Chew 1 tablet (81 mg total) by mouth daily.    Dispense:  30 tablet    Refill:  5    Order Specific Question:   Supervising Provider    Answer:   Jaynie Collins A [[id number]]  . metroNIDAZOLE (FLAGYL) 500 MG tablet    Sig: Take 1 tablet (500 mg total) by mouth 2 (two) times daily.    Dispense:  14 tablet    Refill:  0    Order Specific Question:   Supervising Provider    Answer:   Jaynie Collins A [[id number]]    MDM FHT present via doppler Wet prep + BV GC/CT pending  Pt with history of DVT in previous pregnancy in 2016.  Discussed recommendation for lovenox. Patient refuses d/t fear of needles. States she does not want to get daily injections like she did with her last pregnancy. Discussed risk of DVT and potential outcome for her & fetus. Pt still declines. Agreeable to taking baby ASA.   Pt plans on going to [facility] for prenatal care but is waiting to hear back from them for appointment.   ASSESSMENT 1. BV (bacterial vaginosis)   2. History of deep vein thrombosis (DVT) during pregnancy   3. [redacted] weeks gestation of pregnancy     PLAN Discharge home in stable condition. Rx flagyl Msg to clinic for f/u with [facility]  Allergies as of 02/11/2018   No Known Allergies     Medication List    STOP taking these medications   enoxaparin 40 MG/0.4ML injection Commonly known as:  LOVENOX   ondansetron 8 MG disintegrating tablet Commonly known as:  ZOFRAN-ODT   polyethylene glycol powder powder Commonly known as:   GLYCOLAX/MIRALAX   promethazine 25 MG tablet Commonly known as:  PHENERGAN   sertraline 25 MG tablet Commonly known as:  ZOLOFT     TAKE these medications   acetaminophen 325 MG tablet Commonly known as:  TYLENOL Take 650 mg by mouth every 6 (six) hours as needed for moderate pain or headache.   aspirin 81 MG chewable tablet Chew 1 tablet (81 mg total) by mouth daily.   metroNIDAZOLE 500 MG tablet Commonly known as:  FLAGYL Take 1 tablet (500 mg total) by mouth 2 (two) times daily.        Judeth Horn, NP 02/11/2018  9:35 PM

## 2018-02-11 NOTE — Discharge Instructions (Signed)
Bacterial Vaginosis Bacterial vaginosis is a vaginal infection that occurs when the normal balance of bacteria in the vagina is disrupted. It results from an overgrowth of certain bacteria. This is the most common vaginal infection among women ages 15-44. Because bacterial vaginosis increases your risk for STIs (sexually transmitted infections), getting treated can help reduce your risk for chlamydia, gonorrhea, herpes, and HIV (human immunodeficiency virus). Treatment is also important for preventing complications in pregnant women, because this condition can cause an early (premature) delivery. What are the causes? This condition is caused by an increase in harmful bacteria that are normally present in small amounts in the vagina. However, the reason that the condition develops is not fully understood. What increases the risk? The following factors may make you more likely to develop this condition:  Having a new sexual partner or multiple sexual partners.  Having unprotected sex.  Douching.  Having an intrauterine device (IUD).  Smoking.  Drug and alcohol abuse.  Taking certain antibiotic medicines.  Being pregnant.  You cannot get bacterial vaginosis from toilet seats, bedding, swimming pools, or contact with objects around you. What are the signs or symptoms? Symptoms of this condition include:  Grey or white vaginal discharge. The discharge can also be watery or foamy.  A fish-like odor with discharge, especially after sexual intercourse or during menstruation.  Itching in and around the vagina.  Burning or pain with urination.  Some women with bacterial vaginosis have no signs or symptoms. How is this diagnosed? This condition is diagnosed based on:  Your medical history.  A physical exam of the vagina.  Testing a sample of vaginal fluid under a microscope to look for a large amount of bad bacteria or abnormal cells. Your health care provider may use a cotton swab  or a small wooden spatula to collect the sample.  How is this treated? This condition is treated with antibiotics. These may be given as a pill, a vaginal cream, or a medicine that is put into the vagina (suppository). If the condition comes back after treatment, a second round of antibiotics may be needed. Follow these instructions at home: Medicines  Take over-the-counter and prescription medicines only as told by your health care provider.  Take or use your antibiotic as told by your health care provider. Do not stop taking or using the antibiotic even if you start to feel better. General instructions  If you have a female sexual partner, tell her that you have a vaginal infection. She should see her health care provider and be treated if she has symptoms. If you have a female sexual partner, he does not need treatment.  During treatment: ? Avoid sexual activity until you finish treatment. ? Do not douche. ? Avoid alcohol as directed by your health care provider. ? Avoid breastfeeding as directed by your health care provider.  Drink enough water and fluids to keep your urine clear or pale yellow.  Keep the area around your vagina and rectum clean. ? Wash the area daily with warm water. ? Wipe yourself from front to back after using the toilet.  Keep all follow-up visits as told by your health care provider. This is important. How is this prevented?  Do not douche.  Wash the outside of your vagina with warm water only.  Use protection when having sex. This includes latex condoms and dental dams.  Limit how many sexual partners you have. To help prevent bacterial vaginosis, it is best to have sex with just   one partner (monogamous).  Make sure you and your sexual partner are tested for STIs.  Wear cotton or cotton-lined underwear.  Avoid wearing tight pants and pantyhose, especially during summer.  Limit the amount of alcohol that you drink.  Do not use any products that  contain nicotine or tobacco, such as cigarettes and e-cigarettes. If you need help quitting, ask your health care provider.  Do not use illegal drugs. Where to find more information:  Centers for Disease Control and Prevention: www.cdc.gov/std  American Sexual Health Association (ASHA): www.ashastd.org  U.S. Department of Health and Human Services, Office on Women's Health: www.womenshealth.gov/ or https://www.womenshealth.gov/a-z-topics/bacterial-vaginosis Contact a health care provider if:  Your symptoms do not improve, even after treatment.  You have more discharge or pain when urinating.  You have a fever.  You have pain in your abdomen.  You have pain during sex.  You have vaginal bleeding between periods. Summary  Bacterial vaginosis is a vaginal infection that occurs when the normal balance of bacteria in the vagina is disrupted.  Because bacterial vaginosis increases your risk for STIs (sexually transmitted infections), getting treated can help reduce your risk for chlamydia, gonorrhea, herpes, and HIV (human immunodeficiency virus). Treatment is also important for preventing complications in pregnant women, because the condition can cause an early (premature) delivery.  This condition is treated with antibiotic medicines. These may be given as a pill, a vaginal cream, or a medicine that is put into the vagina (suppository). This information is not intended to replace advice given to you by your health care provider. Make sure you discuss any questions you have with your health care provider. Document Released: 03/10/2005 Document Revised: 07/14/2016 Document Reviewed: 11/24/2015 Elsevier Interactive Patient Education  2018 Elsevier Inc.  

## 2018-02-12 LAB — CULTURE, OB URINE

## 2018-02-12 LAB — GC/CHLAMYDIA PROBE AMP (~~LOC~~) NOT AT ARMC
Chlamydia: NEGATIVE
NEISSERIA GONORRHEA: NEGATIVE

## 2018-03-02 ENCOUNTER — Encounter: Payer: Self-pay | Admitting: *Deleted

## 2018-03-02 ENCOUNTER — Telehealth: Payer: Self-pay | Admitting: Obstetrics and Gynecology

## 2018-03-02 ENCOUNTER — Telehealth: Payer: Self-pay | Admitting: *Deleted

## 2018-03-02 ENCOUNTER — Encounter: Payer: Self-pay | Admitting: Student

## 2018-03-02 NOTE — Telephone Encounter (Signed)
Adelheid called and left a voice message at 4:03pm stating she needs a letter faxed to [facility] in [facility]- she is having dental surgery tomorrow at 12 pm.the letter should state what can and cannot be done.States she has already gotten one letter but she left it with the other dentist. States has not been seen in our office yet- has appointment in January.

## 2018-03-02 NOTE — Telephone Encounter (Signed)
Advised pt that we cannot provide a dental letter for her as she has not been seen in our office in over a year. She responded by saying she has been seen in [facility] a few times and they provided her last letter but the last dentist kept it. I declined transferring her to [facility] and she verbalized understanding and stated she would call and see. Pt ended the call.

## 2018-03-02 NOTE — Telephone Encounter (Signed)
Called patient about moving her appointment, but when I called the number in Epic it was not working.

## 2018-03-04 ENCOUNTER — Encounter: Payer: Self-pay | Admitting: Obstetrics and Gynecology

## 2018-03-08 ENCOUNTER — Telehealth: Payer: Self-pay | Admitting: Family Medicine

## 2018-03-08 NOTE — Telephone Encounter (Signed)
Called both numbers in Epic. The first number I was able to leave a message. The cell phone number was not working. Will call patient again before the day is out.

## 2018-03-10 ENCOUNTER — Encounter: Payer: Self-pay | Admitting: Obstetrics and Gynecology

## 2018-03-12 ENCOUNTER — Ambulatory Visit (INDEPENDENT_AMBULATORY_CARE_PROVIDER_SITE_OTHER): Payer: Medicaid Other | Admitting: Family Medicine

## 2018-03-12 ENCOUNTER — Encounter: Payer: Self-pay | Admitting: Family Medicine

## 2018-03-12 ENCOUNTER — Other Ambulatory Visit (HOSPITAL_COMMUNITY)
Admission: RE | Admit: 2018-03-12 | Discharge: 2018-03-12 | Disposition: A | Discharge status: Home / Self Care | Payer: Medicaid Other | Source: Ambulatory Visit | Attending: Family Medicine | Admitting: Family Medicine

## 2018-03-12 VITALS — BP 127/91 | HR 111 | Wt 136.0 lb

## 2018-03-12 DIAGNOSIS — Z8759 Personal history of other complications of pregnancy, childbirth and the puerperium: Secondary | ICD-10-CM

## 2018-03-12 DIAGNOSIS — Z86718 Personal history of other venous thrombosis and embolism: Secondary | ICD-10-CM

## 2018-03-12 DIAGNOSIS — N898 Other specified noninflammatory disorders of vagina: Secondary | ICD-10-CM

## 2018-03-12 DIAGNOSIS — I82629 Acute embolism and thrombosis of deep veins of unspecified upper extremity: Secondary | ICD-10-CM | POA: Insufficient documentation

## 2018-03-12 DIAGNOSIS — O0993 Supervision of high risk pregnancy, unspecified, third trimester: Secondary | ICD-10-CM

## 2018-03-12 DIAGNOSIS — Z3A23 23 weeks gestation of pregnancy: Secondary | ICD-10-CM

## 2018-03-12 DIAGNOSIS — O0990 Supervision of high risk pregnancy, unspecified, unspecified trimester: Secondary | ICD-10-CM | POA: Insufficient documentation

## 2018-03-12 DIAGNOSIS — Z98891 History of uterine scar from previous surgery: Secondary | ICD-10-CM | POA: Insufficient documentation

## 2018-03-12 MED ORDER — ENOXAPARIN SODIUM 40 MG/0.4ML ~~LOC~~ SOLN: 40.0000 mg | SUBCUTANEOUS | 3 refills | Status: DC

## 2018-03-12 NOTE — Progress Notes (Signed)
Subjective:  Erin Jacobson is a [id number] 6726w2d being seen today for her first obstetrical visit.  Her obstetrical history is significant for DVT in prior pregnancy, prior cesarean delivery. DOes have a successful vaginal delivery. Patient does intend to breast feed. Pregnancy history fully reviewed.  Patient reports no complaints.  BP (!) 127/91   Pulse (!) 111   Wt 136 lb (61.7 kg)   LMP 09/30/2017 (Exact Date)   Breastfeeding Unknown   BMI 25.70 kg/m   HISTORY: OB History  Gravida Para Term Preterm AB Living  3 2 2     2   SAB TAB Ectopic Multiple Live Births        0 2    # Outcome Date GA Lbr Len/2nd Weight Sex Delivery Anes PTL Lv  3 Current           2 Term 06/11/15 6124w1d 30:55 / 04:20 6 lb 10.4 oz (3.015 kg) M CS-LTranv EPI  LIV  1 Term 12/14/09 4668w0d  5 lb 3 oz (2.353 kg) M Vag-Spont   LIV    Past Medical History:  Diagnosis Date  . Complication of anesthesia   . Depression    on meds, doing ok  . DVT of upper extremity (deep vein thrombosis) (HCC)   . GERD (gastroesophageal reflux disease)    no current meds for reflux  . Infection    UTI  . Kidney stone    stent and foley placed to pass kidney stones 05/05/2014  . PONV (postoperative nausea and vomiting)   . Trichomoniasis     Past Surgical History:  Procedure Laterality Date  . APPENDECTOMY  04/01/2004   ex. lap.  . CESAREAN SECTION N/A 06/11/2015   Procedure: CESAREAN SECTION;  Surgeon: Marlow Baarsyanna Clark, MD;  Location: [facility];  Service: Obstetrics;  Laterality: N/A;  . CYSTOSCOPY WITH RETROGRADE PYELOGRAM, URETEROSCOPY AND STENT PLACEMENT Left 05/05/2014   Procedure: LEFT  URETEROSCOPY,  RETROGRADE PYELOGRAM,  AND STENT PLACEMENT, STONE REMOVAL;  Surgeon: Crist FatBenjamin W Herrick, MD;  Location: [facility];  Service: Urology;  Laterality: Left;  . HOLMIUM LASER APPLICATION Left 05/05/2014   Procedure: HOLMIUM LASER APPLICATION;  Surgeon: Crist FatBenjamin W Herrick, MD;  Location: [facility];  Service: Urology;  Laterality: Left;  . KNEE  SURGERY      Family History  Problem Relation Age of Onset  . Anesthesia problems Mother        post-op N/V, hard to wake up  . Arthritis Mother   . Cancer Maternal Grandfather      Exam  BP (!) 127/91   Pulse (!) 111   Wt 136 lb (61.7 kg)   LMP 09/30/2017 (Exact Date)   Breastfeeding Unknown   BMI 25.70 kg/m   CONSTITUTIONAL: Well-developed, well-nourished female in no acute distress.  HENT:  Normocephalic, atraumatic, External right and left ear normal. Oropharynx is clear and moist EYES: Conjunctivae and EOM are normal. Pupils are equal, round, and reactive to light. No scleral icterus.  NECK: Normal range of motion, supple, no masses.  Normal thyroid.  CARDIOVASCULAR: Normal heart rate noted, regular rhythm RESPIRATORY: Clear to auscultation bilaterally. Effort and breath sounds normal, no problems with respiration noted. BREASTS: Patient declined exam. ABDOMEN: Soft, normal bowel sounds, no distention noted.  No tenderness, rebound or guarding.  PELVIC: Normal appearing external genitalia; normal appearing vaginal mucosa and cervix. No abnormal discharge noted. Normal uterine size, no other palpable masses, no uterine or adnexal tenderness. MUSCULOSKELETAL: Normal range of motion. No tenderness.  No  cyanosis, clubbing, or edema.  2+ distal pulses. SKIN: Skin is warm and dry. No rash noted. Not diaphoretic. No erythema. No pallor. NEUROLOGIC: Alert and oriented to person, place, and time. Normal reflexes, muscle tone coordination. No cranial nerve deficit noted. PSYCHIATRIC: Normal mood and affect. Normal behavior. Normal judgment and thought content.    Assessment:    Pregnancy: [id number] Patient Active Problem List   Diagnosis Date Noted  . Supervision of high risk pregnancy, antepartum 03/12/2018  . History of cesarean delivery 03/12/2018  . History of deep vein thrombosis (DVT) during pregnancy 03/12/2018  . Bacterial vaginitis 11/08/2017      Plan:   1.  Supervision of high risk pregnancy, antepartum FHT and FH normal. Desires Panorama. Will get US - Genetic Screening - Hemoglobinopathy Evaluation - Obstetric Panel, Including HIV - US MFM OB DETAIL +14 WK; Future - Culture, OB Urine  2. History of deep vein thrombosis (DVT) during pregnancy Start lovenox PPx dose.  3. Vaginal itching - Cervicovaginal ancillary only( [place])  4. History of cesarean delivery Desires VBAC.     Problem list reviewed and updated. 75% of 45 min visit spent on counseling and coordination of care.     Levie HeritageJacob J Stinson 03/12/2018

## 2018-03-14 LAB — CULTURE, OB URINE

## 2018-03-14 LAB — URINE CULTURE, OB REFLEX

## 2018-03-15 LAB — HEMOGLOBINOPATHY EVALUATION
Ferritin: 6 ng/mL — ABNORMAL LOW (ref 15–150)
HGB A: 97.8 % (ref 96.4–98.8)
HGB S: 0 %
HGB VARIANT: 0 %
Hgb A2 Quant: 2.2 % (ref 1.8–3.2)
Hgb C: 0 %
Hgb F Quant: 0 % (ref 0.0–2.0)
Hgb Solubility: NEGATIVE

## 2018-03-15 LAB — OBSTETRIC PANEL, INCLUDING HIV
Antibody Screen: NEGATIVE
Basophils Absolute: 0 10*3/uL (ref 0.0–0.2)
Basos: 0 %
EOS (ABSOLUTE): 0.1 10*3/uL (ref 0.0–0.4)
Eos: 1 %
HIV Screen 4th Generation wRfx: NONREACTIVE
Hematocrit: 28.7 % — ABNORMAL LOW (ref 34.0–46.6)
Hemoglobin: 9.8 g/dL — ABNORMAL LOW (ref 11.1–15.9)
Hepatitis B Surface Ag: NEGATIVE
Immature Grans (Abs): 0 10*3/uL (ref 0.0–0.1)
Immature Granulocytes: 0 %
Lymphocytes Absolute: 2.3 10*3/uL (ref 0.7–3.1)
Lymphs: 28 %
MCH: 28.7 pg (ref 26.6–33.0)
MCHC: 34.1 g/dL (ref 31.5–35.7)
MCV: 84 fL (ref 79–97)
Monocytes Absolute: 0.5 10*3/uL (ref 0.1–0.9)
Monocytes: 6 %
Neutrophils Absolute: 5.4 10*3/uL (ref 1.4–7.0)
Neutrophils: 65 %
Platelets: 345 10*3/uL (ref 150–450)
RBC: 3.41 x10E6/uL — ABNORMAL LOW (ref 3.77–5.28)
RDW: 13 % (ref 12.3–15.4)
RPR Ser Ql: NONREACTIVE
Rh Factor: POSITIVE
Rubella Antibodies, IGG: 3.66 index (ref >0.99)
WBC: 8.4 10*3/uL (ref 3.4–10.8)

## 2018-03-15 LAB — CERVICOVAGINAL ANCILLARY ONLY
Bacterial vaginitis: NEGATIVE
Candida vaginitis: POSITIVE — AB

## 2018-03-18 ENCOUNTER — Other Ambulatory Visit: Payer: Self-pay | Admitting: Family Medicine

## 2018-03-18 ENCOUNTER — Telehealth: Payer: Self-pay | Admitting: *Deleted

## 2018-03-18 MED ORDER — FLUCONAZOLE 150 MG PO TABS: 150.0000 mg | ORAL_TABLET | Freq: Once | ORAL | 0 refills | Status: AC

## 2018-03-18 MED ORDER — FERROUS GLUCONATE 324 (38 FE) MG PO TABS: 324.0000 mg | ORAL_TABLET | Freq: Three times a day (TID) | ORAL | 3 refills | Status: DC

## 2018-03-18 NOTE — Telephone Encounter (Signed)
-----   Message from Levie HeritageJacob J Stinson, DO sent at 03/18/2018  8:20 AM EST ----- Patient has yeast infection - diflucan sent to pharmacy.

## 2018-03-18 NOTE — Telephone Encounter (Signed)
LM for pt that we are calling for medication management and results. Please call the office.

## 2018-03-18 NOTE — Telephone Encounter (Signed)
Called pt to inform her that she has a yeast infection as well as anemia.  Diflucan for the yeast and fergon for the anemia were sent to the [facility] on [address].  Pt did not pick up.  Voicemail left, requesting pt call the office.

## 2018-03-19 ENCOUNTER — Encounter (HOSPITAL_COMMUNITY): Payer: Self-pay

## 2018-03-19 NOTE — Telephone Encounter (Signed)
Called pt to inform her of yeast infection and anemia. Pt did not pick up.  Left message that I was trying to contact her regarding results and requested that she return the call.  Letter sent.

## 2018-03-22 NOTE — Progress Notes (Signed)
Pt call transferred from the front office.  Pt stated that she received a call about results and that she has picked up the medication.  I explained to the pt that she has a yeast infection and she is anemic so medication has been prescribed.  Pt states that she is already taking iron tablet but she does not take them everyday because they make her constipated.  I advised pt to take otc docusate sodium 100 mg po bid to help prevent constipation.  Pt stated understanding with on further questions.

## 2018-03-23 ENCOUNTER — Encounter: Payer: Self-pay | Admitting: *Deleted

## 2018-03-24 NOTE — L&D Delivery Note (Signed)
OB/GYN Faculty Practice Delivery Note  Erin Jacobson is a 28 y.o. [phone] s/p VBAC at [redacted]w[redacted]d. She was admitted for spontaneous onset of preterm labor.   ROM: 0h 19m with clear fluid GBS Status: positive - no treatment given because precipitous  Maximum Maternal Temperature: Temp (24hrs), Avg:98.4 F (36.9 C), Min:98.4 F (36.9 C), Max:98.5 F (36.9 C)  Labor Progress: . Admitted in active labor . SROM in [facility]U just prior to transfer  . Complete and pushing soon after arrival to L&D  Delivery Date/Time: 06/14/18 at 1150 Delivery: Called to room and patient was complete and pushing. Head delivered LOA. No nuchal cord present. Shoulder and body delivered in usual fashion. Infant with spontaneous cry, placed on mother's abdomen, dried and stimulated. Cord clamped x 2 after 1-minute delay, and cut by grandmother of baby. Cord blood drawn. Placenta delivered spontaneously with gentle cord traction. Fundus firm with massage and Pitocin. Labia, perineum, vagina, and cervix inspected inspected with right labial lacerations.   Placenta: spontaneous, intact, 3-vessel cord (discarded) Complications: none Lacerations: right labial repaired with 4-0 Monocryl EBL: 138 per [place] Analgesia: intralesional lidocaine  Postpartum Planning [x]  message to sent to schedule follow-up  [x]  vaccines UTD  Infant: Vigorous  APGARs 9, 9  3225g  Erin Troyer S. Earlene Plater, DO OB/GYN Fellow, Faculty Practice

## 2018-03-26 ENCOUNTER — Ambulatory Visit (HOSPITAL_COMMUNITY): Admission: RE | Admit: 2018-03-26 | Payer: Medicaid Other | Source: Ambulatory Visit

## 2018-04-07 ENCOUNTER — Other Ambulatory Visit: Payer: Self-pay | Admitting: *Deleted

## 2018-04-07 DIAGNOSIS — O0990 Supervision of high risk pregnancy, unspecified, unspecified trimester: Secondary | ICD-10-CM

## 2018-04-09 ENCOUNTER — Encounter: Payer: Self-pay | Admitting: Obstetrics and Gynecology

## 2018-04-09 ENCOUNTER — Other Ambulatory Visit (HOSPITAL_COMMUNITY)
Admission: RE | Admit: 2018-04-09 | Discharge: 2018-04-09 | Disposition: A | Discharge status: Home / Self Care | Payer: Medicaid Other | Source: Ambulatory Visit | Attending: Obstetrics and Gynecology | Admitting: Obstetrics and Gynecology

## 2018-04-09 ENCOUNTER — Ambulatory Visit (INDEPENDENT_AMBULATORY_CARE_PROVIDER_SITE_OTHER): Payer: Medicaid Other | Admitting: Obstetrics and Gynecology

## 2018-04-09 ENCOUNTER — Other Ambulatory Visit: Payer: Medicaid Other

## 2018-04-09 VITALS — BP 111/74 | HR 104 | Wt 142.4 lb

## 2018-04-09 DIAGNOSIS — Z9119 Patient's noncompliance with other medical treatment and regimen: Secondary | ICD-10-CM

## 2018-04-09 DIAGNOSIS — O0990 Supervision of high risk pregnancy, unspecified, unspecified trimester: Secondary | ICD-10-CM

## 2018-04-09 DIAGNOSIS — F329 Major depressive disorder, single episode, unspecified: Secondary | ICD-10-CM

## 2018-04-09 DIAGNOSIS — O99344 Other mental disorders complicating childbirth: Secondary | ICD-10-CM | POA: Diagnosis not present

## 2018-04-09 DIAGNOSIS — O0992 Supervision of high risk pregnancy, unspecified, second trimester: Secondary | ICD-10-CM

## 2018-04-09 DIAGNOSIS — Z91199 Patient's noncompliance with other medical treatment and regimen due to unspecified reason: Secondary | ICD-10-CM

## 2018-04-09 DIAGNOSIS — N898 Other specified noninflammatory disorders of vagina: Secondary | ICD-10-CM | POA: Insufficient documentation

## 2018-04-09 DIAGNOSIS — Z3A27 27 weeks gestation of pregnancy: Secondary | ICD-10-CM | POA: Diagnosis not present

## 2018-04-09 DIAGNOSIS — O09893 Supervision of other high risk pregnancies, third trimester: Secondary | ICD-10-CM

## 2018-04-09 DIAGNOSIS — Z8619 Personal history of other infectious and parasitic diseases: Secondary | ICD-10-CM

## 2018-04-09 DIAGNOSIS — Z23 Encounter for immunization: Secondary | ICD-10-CM | POA: Diagnosis not present

## 2018-04-09 DIAGNOSIS — F419 Anxiety disorder, unspecified: Secondary | ICD-10-CM

## 2018-04-09 DIAGNOSIS — O09892 Supervision of other high risk pregnancies, second trimester: Secondary | ICD-10-CM | POA: Diagnosis not present

## 2018-04-09 DIAGNOSIS — O09899 Supervision of other high risk pregnancies, unspecified trimester: Secondary | ICD-10-CM | POA: Insufficient documentation

## 2018-04-09 DIAGNOSIS — Z87898 Personal history of other specified conditions: Secondary | ICD-10-CM | POA: Insufficient documentation

## 2018-04-09 DIAGNOSIS — O0930 Supervision of pregnancy with insufficient antenatal care, unspecified trimester: Secondary | ICD-10-CM | POA: Insufficient documentation

## 2018-04-09 LAB — POCT URINALYSIS DIP (DEVICE)
Bilirubin Urine: NEGATIVE
Glucose, UA: NEGATIVE mg/dL
Ketones, ur: NEGATIVE mg/dL
NITRITE: NEGATIVE
PROTEIN: NEGATIVE mg/dL
Specific Gravity, Urine: 1.025 (ref 1.005–1.030)
UROBILINOGEN UA: 1 mg/dL (ref 0.0–1.0)
pH: 6 (ref 5.0–8.0)

## 2018-04-09 MED ORDER — MICONAZOLE NITRATE 2 % VA CREA: 1.0000 | TOPICAL_CREAM | Freq: Every day | VAGINAL | 2 refills | Status: DC

## 2018-04-09 NOTE — Progress Notes (Signed)
Prenatal Visit Note Date: 04/09/2018 Clinic: [facility]  Subjective:  Erin Jacobson is a 28 y.o. G3P2002 at [redacted]w[redacted]d being seen today for ongoing prenatal care.  She is currently monitored for the following issues for this high-risk pregnancy and has Supervision of high risk pregnancy, antepartum; History of cesarean delivery; History of deep vein thrombosis (DVT) during pregnancy; History of poor fetal growth; Late prenatal care; History of sexually transmitted disease; Non-compliant pregnant patient; and Anxiety and depression on their problem list.  Patient reports no complaints.   Contractions: Not present. Vag. Bleeding: None.  Movement: Present. Denies leaking of fluid.   The following portions of the patient's history were reviewed and updated as appropriate: allergies, current medications, past family history, past medical history, past social history, past surgical history and problem list. Problem list updated.  Objective:   Vitals:   04/09/18 0825  BP: 111/74  Pulse: (!) 104  Weight: 142 lb 6.4 oz (64.6 kg)    Fetal Status: Fetal Heart Rate (bpm): 155  Fundal Height: 27 cm Movement: Present     General:  Alert, oriented and cooperative. Patient is in no acute distress.  Skin: Skin is warm and dry. No rash noted.   Cardiovascular: Normal heart rate noted  Respiratory: Normal respiratory effort, no problems with respiration noted  Abdomen: Soft, gravid, appropriate for gestational age. Pain/Pressure: Present     Pelvic:  Cervical exam deferred        Extremities: Normal range of motion.  Edema: None  Mental Status: Normal mood and affect. Normal behavior. Normal judgment and thought content.   Urinalysis:      Assessment and Plan:  Pregnancy: G3P2002 at [redacted]w[redacted]d  1. Supervision of high risk pregnancy, antepartum Routine care. Pt no showed to anatomy u/s, rescheduled. btl paperworks signed today. 28wk labs today. Pt confirms on iron - Cervicovaginal  ancillary only( [place])  2. Psych No current thoughts of SI/HI or plan, contracts for safety. BH out of the office today. Will CC them on this message for follow up next week  3. Vaginal irritation Monistat 7 sent in - Cervicovaginal ancillary only( [place])  4. H/o VTE Pt not taking b/c she states she doesn't like the shot. Rationale for it d/w her  5. H/o c-section Pt to consider options. Not leaning one way or the other. I told her that can d/w her more next week but i'd be leaning towards rpt c/s  6. H/o FGR Follow [facility]. Recommend surveillance u/s in third trimester  Preterm labor symptoms and general obstetric precautions including but not limited to vaginal bleeding, contractions, leaking of fluid and fetal movement were reviewed in detail with the patient. Please refer to After Visit Summary for other counseling recommendations.  Return in about 2 weeks (around 04/23/2018) for hrob.   Farley Bing, MD

## 2018-04-09 NOTE — Progress Notes (Signed)
Anatomy ultrasound scheduled for 04/14/18 @ 815 at [facility].  Pt instructed to arrive at 0800 and the reminder about children given. Pt verbalized understanding.

## 2018-04-09 NOTE — Addendum Note (Signed)
Addended by: Osvaldo Human on: 04/09/2018 09:47 AM   Modules accepted: Orders

## 2018-04-10 LAB — CBC
HEMOGLOBIN: 9 g/dL — AB (ref 11.1–15.9)
Hematocrit: 28 % — ABNORMAL LOW (ref 34.0–46.6)
MCH: 27.5 pg (ref 26.6–33.0)
MCHC: 32.1 g/dL (ref 31.5–35.7)
MCV: 86 fL (ref 79–97)
Platelets: 289 10*3/uL (ref 150–450)
RBC: 3.27 x10E6/uL — ABNORMAL LOW (ref 3.77–5.28)
RDW: 13 % (ref 11.7–15.4)
WBC: 9.4 10*3/uL (ref 3.4–10.8)

## 2018-04-10 LAB — SYPHILIS: RPR W/REFLEX TO RPR TITER AND TREPONEMAL ANTIBODIES, TRADITIONAL SCREENING AND DIAGNOSIS ALGORITHM: RPR Ser Ql: NONREACTIVE

## 2018-04-10 LAB — GLUCOSE TOLERANCE, 2 HOURS W/ 1HR
Glucose, 1 hour: 88 mg/dL (ref 65–179)
Glucose, 2 hour: 84 mg/dL (ref 65–152)
Glucose, Fasting: 69 mg/dL (ref 65–91)

## 2018-04-10 LAB — HIV ANTIBODY (ROUTINE TESTING W REFLEX): HIV Screen 4th Generation wRfx: NONREACTIVE

## 2018-04-12 ENCOUNTER — Encounter: Payer: Self-pay | Admitting: Obstetrics and Gynecology

## 2018-04-12 ENCOUNTER — Telehealth: Payer: Self-pay | Admitting: Clinical

## 2018-04-12 DIAGNOSIS — O99019 Anemia complicating pregnancy, unspecified trimester: Secondary | ICD-10-CM | POA: Insufficient documentation

## 2018-04-12 LAB — CERVICOVAGINAL ANCILLARY ONLY
Chlamydia: NEGATIVE
Neisseria Gonorrhea: NEGATIVE
TRICH (WINDOWPATH): NEGATIVE

## 2018-04-12 NOTE — Telephone Encounter (Signed)
Left HIPPA-compliant message to call back Icey Tello from [facility]  at [phone].  

## 2018-04-14 ENCOUNTER — Ambulatory Visit (HOSPITAL_COMMUNITY): Admission: RE | Admit: 2018-04-14 | Payer: Medicaid Other | Source: Ambulatory Visit

## 2018-04-15 ENCOUNTER — Telehealth: Payer: Self-pay | Admitting: Advanced Practice Midwife

## 2018-04-15 NOTE — Telephone Encounter (Signed)
Patient called [facility] this morning to cancel her appointment. Called patient to get confirmation of this cancellation. I left a voicemail for her to call us back. I wanted to make sure this is the appointment she wanted to cancel, before I cancel it.

## 2018-04-16 ENCOUNTER — Encounter: Payer: Self-pay | Admitting: Nurse Practitioner

## 2018-04-16 ENCOUNTER — Encounter: Payer: Self-pay | Admitting: Obstetrics and Gynecology

## 2018-04-21 ENCOUNTER — Telehealth: Payer: Self-pay | Admitting: Medical

## 2018-04-21 NOTE — Telephone Encounter (Signed)
Called in to cancel the upcoming appointment. Stated she does not have sitter and the hospital is currently under flu restrictions. Offered to reschedule, the patient stated she would call and reschedule at a later time.

## 2018-04-23 ENCOUNTER — Encounter: Payer: Self-pay | Admitting: Advanced Practice Midwife

## 2018-04-26 ENCOUNTER — Encounter: Payer: Self-pay | Admitting: *Deleted

## 2018-05-23 ENCOUNTER — Other Ambulatory Visit: Payer: Self-pay

## 2018-05-23 ENCOUNTER — Ambulatory Visit (HOSPITAL_COMMUNITY)
Admission: EM | Admit: 2018-05-23 | Discharge: 2018-05-23 | Disposition: A | Discharge status: Home / Self Care | Payer: Medicaid Other | Attending: Emergency Medicine | Admitting: Emergency Medicine

## 2018-05-23 ENCOUNTER — Encounter (HOSPITAL_COMMUNITY): Payer: Self-pay

## 2018-05-23 DIAGNOSIS — J189 Pneumonia, unspecified organism: Secondary | ICD-10-CM | POA: Diagnosis not present

## 2018-05-23 MED ORDER — CEFDINIR 300 MG PO CAPS: 300.0000 mg | ORAL_CAPSULE | Freq: Two times a day (BID) | ORAL | 0 refills | Status: AC

## 2018-05-23 NOTE — ED Triage Notes (Addendum)
Pt went to her PCP and was given Tamiflu.told that she has Flu A. But she is not taking it because she dose not like it. Pt wants to know if she has pneumona or not.  Pt is [redacted] weeks pregnant.

## 2018-05-23 NOTE — ED Provider Notes (Signed)
[facility]    CSN: [id number] Arrival date & time: 05/23/18  1337     History   Chief Complaint Chief Complaint  Patient presents with  . Influenza    HPI Erin Jacobson is a 28 y.o. female.   She was diagnosed with flu 5 days ago.  Symptoms started approximately 7 or 8 days ago.  Now her main complaint is cough, shortness of breath, and mild chest pain worse with coughing.  The history is provided by the patient.  Influenza  Presenting symptoms: cough, rhinorrhea and shortness of breath   Presenting symptoms: no fever, no sore throat and no vomiting   Severity:  Severe Onset quality:  Gradual Duration:  8 days Progression:  Worsening Chronicity:  New Relieved by:  Nothing Worsened by:  Nothing Ineffective treatments:  OTC medications Associated symptoms: no chills, no decreased appetite, no ear pain and no mental status change   Risk factors: pregnancy     Past Medical History:  Diagnosis Date  . Complication of anesthesia   . Depression    on meds, doing ok  . DVT of upper extremity (deep vein thrombosis) (HCC)   . GERD (gastroesophageal reflux disease)    no current meds for reflux  . Infection    UTI  . Kidney stone    stent and foley placed to pass kidney stones 05/05/2014  . PONV (postoperative nausea and vomiting)   . Trichomoniasis     Patient Active Problem List   Diagnosis Date Noted  . Anemia in pregnancy 04/12/2018  . History of poor fetal growth 04/09/2018  . Late prenatal care 04/09/2018  . History of sexually transmitted disease 04/09/2018  . Non-compliant pregnant patient 04/09/2018  . Anxiety and depression 04/09/2018  . Supervision of high risk pregnancy, antepartum 03/12/2018  . History of cesarean delivery 03/12/2018  . History of deep vein thrombosis (DVT) during pregnancy 03/12/2018    Past Surgical History:  Procedure Laterality Date  . APPENDECTOMY  04/01/2004   ex. lap.  . CESAREAN SECTION N/A 06/11/2015   Procedure:  CESAREAN SECTION;  Surgeon: Marlow Baars, MD;  Location: [facility];  Service: Obstetrics;  Laterality: N/A;  . CYSTOSCOPY WITH RETROGRADE PYELOGRAM, URETEROSCOPY AND STENT PLACEMENT Left 05/05/2014   Procedure: LEFT  URETEROSCOPY,  RETROGRADE PYELOGRAM,  AND STENT PLACEMENT, STONE REMOVAL;  Surgeon: Crist Fat, MD;  Location: [facility];  Service: Urology;  Laterality: Left;  . HOLMIUM LASER APPLICATION Left 05/05/2014   Procedure: HOLMIUM LASER APPLICATION;  Surgeon: Crist Fat, MD;  Location: [facility];  Service: Urology;  Laterality: Left;  . KNEE SURGERY      OB History    Gravida  3   Para  2   Term  2   Preterm      AB      Living  2     SAB      TAB      Ectopic      Multiple  0   Live Births  2            Home Medications    Prior to Admission medications   Medication Sig Start Date End Date Taking? Authorizing Provider  aspirin 81 MG chewable tablet Chew 1 tablet (81 mg total) by mouth daily. 02/11/18   Judeth Horn, NP  cefdinir (OMNICEF) 300 MG capsule Take 1 capsule (300 mg total) by mouth 2 (two) times daily for 10 days. 05/23/18 06/02/18  Marshell Levan,  MD  ferrous gluconate (FERGON) 324 MG tablet Take 1 tablet (324 mg total) by mouth 3 (three) times daily with meals. 03/18/18   Levie HeritageStinson, Jacob J, DO  Prenatal Vit-Fe Fumarate-FA (PRENATAL VITAMINS) 28-0.8 MG TABS Take 1 tablet by mouth daily. 01/27/18   [provider]    Family History Family History  Problem Relation Age of Onset  . Anesthesia problems Mother        post-op N/V, hard to wake up  . Arthritis Mother   . Cancer Maternal Grandfather     Social History Social History   Tobacco Use  . Smoking status: Former Smoker    Types: Cigars    Last attempt to quit: 09/24/2014    Years since quitting: 3.6  . Smokeless tobacco: Never Used  Substance Use Topics  . Alcohol use: Not Currently    Comment: occasional/ not since pregnancy  . Drug use: No     Allergies     Patient has no known allergies.   Review of Systems Review of Systems  Constitutional: Negative for chills, decreased appetite and fever.  HENT: Positive for rhinorrhea. Negative for ear pain and sore throat.   Eyes: Negative for pain and visual disturbance.  Respiratory: Positive for cough and shortness of breath.   Cardiovascular: Negative for chest pain and palpitations.  Gastrointestinal: Negative for abdominal pain and vomiting.  Genitourinary: Negative for dysuria and hematuria.  Musculoskeletal: Negative for arthralgias and back pain.  Skin: Negative for color change and rash.  Neurological: Negative for seizures and syncope.  All other systems reviewed and are negative.    Physical Exam Triage Vital Signs ED Triage Vitals  Enc Vitals Group     BP 05/23/18 1406 106/75     Pulse Rate 05/23/18 1406 100     Resp 05/23/18 1406 18     Temp 05/23/18 1406 97.6 F (36.4 C)     Temp Source 05/23/18 1406 Tympanic     SpO2 05/23/18 1406 98 %     Weight 05/23/18 1402 145 lb (65.8 kg)     Height --      Head Circumference --      Peak Flow --      Pain Score 05/23/18 1407 2     Pain Loc --      Pain Edu? --      Excl. in GC? --    No data found.  Updated Vital Signs BP 106/75   Pulse 100   Temp 97.6 F (36.4 C) (Tympanic)   Resp 18   Wt 65.8 kg   LMP 09/30/2017 (Exact Date)   SpO2 98%   BMI 27.40 kg/m   Visual Acuity Right Eye Distance:   Left Eye Distance:   Bilateral Distance:    Right Eye Near:   Left Eye Near:    Bilateral Near:     Physical Exam Constitutional:      Appearance: Normal appearance.  HENT:     Head: Normocephalic and atraumatic.     Nose: Nose normal.     Mouth/Throat:     Mouth: Mucous membranes are moist.     Pharynx: No oropharyngeal exudate or posterior oropharyngeal erythema.  Eyes:     Extraocular Movements: Extraocular movements intact.     Conjunctiva/sclera: Conjunctivae normal.  Neck:     Musculoskeletal: Normal range  of motion.  Cardiovascular:     Rate and Rhythm: Regular rhythm. Tachycardia present.  Pulmonary:     Effort: Pulmonary effort  is normal. No respiratory distress.     Breath sounds: Rhonchi (moderate, diffuse) present.  Abdominal:     Comments: gravid  Musculoskeletal: Normal range of motion.        General: No swelling or signs of injury.  Lymphadenopathy:     Cervical: No cervical adenopathy.  Skin:    General: Skin is warm and dry.  Neurological:     General: No focal deficit present.     Mental Status: She is alert and oriented to person, place, and time.  Psychiatric:        Mood and Affect: Mood normal.        Behavior: Behavior normal.      UC Treatments / Results  Labs (all labs ordered are listed, but only abnormal results are displayed) Labs Reviewed - No data to display  EKG None  Radiology No results found.  Procedures Procedures (including critical care time)  Medications Ordered in UC Medications - No data to display  Initial Impression / Assessment and Plan / UC Course  I have reviewed the triage vital signs and the nursing notes.  Pertinent labs & imaging results that were available during my care of the patient were reviewed by me and considered in my medical decision making (see chart for details).    At this point she likely has a post viral infection.  She is well-appearing and suitable for outpatient management.  We discussed empiric treatment versus x-ray diagnosis.  Because she is pregnant we declined x-ray.  She was given return precautions and recommendations for symptomatic treatment. Final Clinical Impressions(s) / UC Diagnoses   Final diagnoses:  Pneumonia due to infectious organism, unspecified laterality, unspecified part of lung   Discharge Instructions   None    ED Prescriptions    Medication Sig Dispense Auth. Provider   cefdinir (OMNICEF) 300 MG capsule Take 1 capsule (300 mg total) by mouth 2 (two) times daily for 10 days.  20 capsule Marshell Levan, MD     Controlled Substance Prescriptions [place] Controlled Substance Registry consulted? No   Marshell Levan, MD 05/23/18 1444

## 2018-05-28 ENCOUNTER — Other Ambulatory Visit: Payer: Self-pay | Admitting: Advanced Practice Midwife

## 2018-05-28 ENCOUNTER — Ambulatory Visit (INDEPENDENT_AMBULATORY_CARE_PROVIDER_SITE_OTHER): Payer: Medicaid Other | Admitting: Advanced Practice Midwife

## 2018-05-28 ENCOUNTER — Ambulatory Visit (INDEPENDENT_AMBULATORY_CARE_PROVIDER_SITE_OTHER): Payer: Medicaid Other | Admitting: Clinical

## 2018-05-28 VITALS — BP 113/74 | HR 102 | Temp 98.8°F | Wt 145.2 lb

## 2018-05-28 DIAGNOSIS — O0930 Supervision of pregnancy with insufficient antenatal care, unspecified trimester: Secondary | ICD-10-CM | POA: Diagnosis not present

## 2018-05-28 DIAGNOSIS — Z98891 History of uterine scar from previous surgery: Secondary | ICD-10-CM

## 2018-05-28 DIAGNOSIS — O99343 Other mental disorders complicating pregnancy, third trimester: Secondary | ICD-10-CM

## 2018-05-28 DIAGNOSIS — O0990 Supervision of high risk pregnancy, unspecified, unspecified trimester: Secondary | ICD-10-CM

## 2018-05-28 DIAGNOSIS — O99513 Diseases of the respiratory system complicating pregnancy, third trimester: Secondary | ICD-10-CM

## 2018-05-28 DIAGNOSIS — F329 Major depressive disorder, single episode, unspecified: Secondary | ICD-10-CM

## 2018-05-28 DIAGNOSIS — F32A Depression, unspecified: Secondary | ICD-10-CM

## 2018-05-28 DIAGNOSIS — F332 Major depressive disorder, recurrent severe without psychotic features: Secondary | ICD-10-CM

## 2018-05-28 DIAGNOSIS — O99713 Diseases of the skin and subcutaneous tissue complicating pregnancy, third trimester: Secondary | ICD-10-CM

## 2018-05-28 DIAGNOSIS — J189 Pneumonia, unspecified organism: Secondary | ICD-10-CM

## 2018-05-28 DIAGNOSIS — Z3A34 34 weeks gestation of pregnancy: Secondary | ICD-10-CM

## 2018-05-28 DIAGNOSIS — O09293 Supervision of pregnancy with other poor reproductive or obstetric history, third trimester: Secondary | ICD-10-CM

## 2018-05-28 DIAGNOSIS — Z8759 Personal history of other complications of pregnancy, childbirth and the puerperium: Secondary | ICD-10-CM

## 2018-05-28 DIAGNOSIS — L299 Pruritus, unspecified: Secondary | ICD-10-CM

## 2018-05-28 DIAGNOSIS — Z3A35 35 weeks gestation of pregnancy: Secondary | ICD-10-CM

## 2018-05-28 DIAGNOSIS — O99013 Anemia complicating pregnancy, third trimester: Secondary | ICD-10-CM

## 2018-05-28 DIAGNOSIS — Z363 Encounter for antenatal screening for malformations: Secondary | ICD-10-CM

## 2018-05-28 DIAGNOSIS — Z86718 Personal history of other venous thrombosis and embolism: Secondary | ICD-10-CM

## 2018-05-28 MED ORDER — ALBUTEROL SULFATE HFA 108 (90 BASE) MCG/ACT IN AERS: 2.0000 | INHALATION_SPRAY | Freq: Four times a day (QID) | RESPIRATORY_TRACT | 2 refills | Status: DC | PRN

## 2018-05-28 MED ORDER — AEROCHAMBER PLUS FLO-VU MEDIUM MISC: 1.0000 | Freq: Once | Status: DC

## 2018-05-28 MED ORDER — SERTRALINE HCL 25 MG PO TABS: 75.0000 mg | ORAL_TABLET | Freq: Every day | ORAL | 6 refills | Status: DC

## 2018-05-28 NOTE — Progress Notes (Signed)
Iron infusion scheduled for 06/01/18 @9am . Anatomy ultrasound scheduled for 06/10/18 @2pm . Pt made aware of time and dates of both appointments and had no questions or concerns.

## 2018-05-28 NOTE — BH Specialist Note (Signed)
Integrated Behavioral Health Initial Visit  MRN: [id number] Name: Erin Jacobson  Number of Integrated Behavioral Health Clinician visits:: 1/6 Session Start time: 10:12 Session End time: 10:48 Total time: 30 minutes  Type of Service: Integrated Behavioral Health- Individual/Family Interpretor:No. Interpretor Name and Language: n/a   Warm Hand Off Completed.       SUBJECTIVE: Erin Jacobson is a 28 y.o. female accompanied by n/a Patient was referred by Dorathy Kinsman, CNM for depression Patient reports the following symptoms/concerns: Pt states her primary concern today is feeling depressed and fatigue, that she attributes to being sick for the past two weeks. Pt has a history of SI; was hospitalized at [facility] in October 2019 after "taking a bunch of pills". Pt denies current SI, no plan, no intent. Pt coped best in the past with medication and writing daily, along with looking forward to a better future for herself. Duration of problem: increase in past two weeks; Severity of problem: severe  OBJECTIVE: Mood: Depressed and Affect: Depressed Risk of harm to self or others: Suicidal ideation No plan to harm self or others  LIFE CONTEXT: Family and Social: Pt lives with her mother and two children (8yo;2yo boys) School/Work: - Self-Care: - Life Changes: Current pregnancy  GOALS ADDRESSED: Patient will: 1. Reduce symptoms of: depression 2. Increase knowledge and/or ability of: healthy habits  3. Demonstrate ability to: Increase healthy adjustment to current life circumstances, Increase motivation to adhere to plan of care and Improve medication compliance  INTERVENTIONS: Interventions utilized: Medication Monitoring, Psychoeducation and/or Health Education and Link to [facility]  Standardized Assessments completed: C-SSRS Short, GAD-7 and PHQ 9  ASSESSMENT: Patient currently experiencing Major depressive disorder, recurrent, without psychotic features.   Patient may  benefit from psychoeducation and brief therapeutic interventions regarding coping with symptoms of depression   PLAN: 1. Follow up with behavioral health clinician on : One week 2. Behavioral recommendations:  -Follow safety plan -Begin taking Zoloft, as prescribed -Continue with plan to attend appointment for iron infusion -Consider writing again daily -Read educational materials regarding coping with symptoms of depression  3. Referral(s): Integrated Behavioral Health Services (In Clinic) 4. "From scale of 1-10, how likely are you to follow plan?": -  Rae Lips, LCSW  Depression screen [facility] 2/9 05/28/2018 04/09/2018 03/12/2018  Decreased Interest 2 1 0  Down, Depressed, Hopeless 3 1 1   PHQ - 2 Score 5 2 1   Altered sleeping 3 1 1   Tired, decreased energy 2 3 3   Change in appetite 2 0 1  Feeling bad or failure about yourself  1 0 1  Trouble concentrating 0 3 3  Moving slowly or fidgety/restless 0 0 0  Suicidal thoughts 2 1 1   PHQ-9 Score 15 10 11    GAD 7 : Generalized Anxiety Score 05/28/2018 04/09/2018 03/12/2018  Nervous, Anxious, on Edge 3 2 2   Control/stop worrying 2 1 3   Worry too much - different things 3 3 3   Trouble relaxing 1 0 1  Restless 0 0 0  Easily annoyed or irritable 3 1 0  Afraid - awful might happen 1 0 0  Total GAD 7 Score 13 7 9

## 2018-05-28 NOTE — Patient Instructions (Signed)
Pregnancy and Anemia ° °Anemia is a condition in which the concentration of red blood cells, or hemoglobin, in the blood is below normal. Hemoglobin is a substance in red blood cells that carries oxygen to the tissues of the body. Anemia results when enough oxygen does not reach these tissues. °Anemia is common during pregnancy because the woman's body needs more blood volume and blood cells to provide nutrition to the fetus. The fetus needs iron and folic acid as it is developing. Your body may not produce enough red blood cells because of this. Also, during pregnancy, the liquid part of the blood (plasma) increases by about 30-50%, and the red blood cells increase by only 20%. This lowers the concentration of the red blood cells and creates a natural anemia-like situation. °What are the causes? °The most common cause of anemia during pregnancy is not having enough iron in the body to make red blood cells (iron deficiency anemia). Other causes may include: °· Folic acid deficiency. °· Vitamin B12 deficiency. °· Certain prescription or over-the-counter medicines. °· Certain medical conditions or infections that destroy red blood cells. °· A low platelet count and bleeding caused by antibodies that go through the placenta to the fetus from the mother’s blood. °What are the signs or symptoms? °Mild anemia may not be noticeable. If it becomes severe, symptoms may include: °· Feeling tired (fatigue). °· Shortness of breath, especially during activity. °· Weakness. °· Fainting. °· Pale looking skin. °· Headaches. °· A fast or irregular heartbeat (palpitations). °· Dizziness. °How is this diagnosed? °This condition may be diagnosed based on: °· Your medical history and a physical exam. °· Blood tests. °How is this treated? °Treatment for anemia during pregnancy depends on the cause of the anemia. Treatment can include: °· Dietary changes. °· Supplements of iron, vitamin B12, or folic acid. °· A blood transfusion. This may  be needed if anemia is severe. °· Hospitalization. This may be needed if there is a lot of blood loss or severe anemia. °Follow these instructions at home: °· Follow recommendations from your dietitian or health care provider about changing your diet. °· Increase your vitamin C intake. This will help the stomach absorb more iron. Some foods that are high in vitamin C include: °? Oranges. °? Peppers. °? Tomatoes. °? Mangoes. °· Eat a diet rich in iron. This would include foods such as: °? Liver. °? Beef. °? Eggs. °? Whole grains. °? Spinach. °? Dried fruit. °· Take iron and vitamins as told by your health care provider. °· Eat green leafy vegetables. These are a good source of folic acid. °· Keep all follow-up visits as told by your health care provider. This is important. °Contact a health care provider if: °· You have frequent or lasting headaches. °· You look pale. °· You bruise easily. °Get help right away if: °· You have extreme weakness, shortness of breath, or chest pain. °· You become dizzy or have trouble concentrating. °· You have heavy vaginal bleeding. °· You develop a rash. °· You have bloody or black, tarry stools. °· You faint. °· You vomit up blood. °· You vomit repeatedly. °· You have abdominal pain. °· You have a fever. °· You are dehydrated. °Summary °· Anemia is a condition in which the concentration of red blood cells or hemoglobin in the blood is below normal. °· Anemia is common during pregnancy because the woman's body needs more blood volume and blood cells to provide nutrition to the fetus. °· The most   common cause of anemia during pregnancy is not having enough iron in the body to make red blood cells (iron deficiency anemia). °· Mild anemia may not be noticeable. If it becomes severe, symptoms may include feeling tired and weak. °This information is not intended to replace advice given to you by your health care provider. Make sure you discuss any questions you have with your health care  provider. °Document Released: 03/07/2000 Document Revised: 04/15/2016 Document Reviewed: 04/15/2016 °Elsevier Interactive Patient Education © 2019 [employer] ° °

## 2018-05-28 NOTE — Progress Notes (Signed)
PRENATAL VISIT NOTE  Subjective:  Erin Jacobson is a 28 y.o. G3P2002 at [redacted]w[redacted]d being seen today for ongoing prenatal care.  She is currently monitored for the following issues for this high-risk pregnancy and has Supervision of high risk pregnancy, antepartum; History of cesarean delivery; History of deep vein thrombosis (DVT) during pregnancy; History of poor fetal growth; Late prenatal care; History of sexually transmitted disease; Non-compliant pregnant patient; Anxiety and depression; and Anemia in pregnancy on their problem list.  Patient reports itching all over. Denies rash, hives, swelling of lips or tongue.  Was seen at Urgent Care 05/23/18 for cough, shortness of breath, and mild chest pain worse with coughing. States she was Dx'd w/ Flu 7-8 days prior to that (not labs or visit in Epic). Was Dx'd w/ post-viral infection/pneumonia and Rx'd Omnicef. States she has been taking as directed and that those Sx have improved slightly, but she still coughing a lot and her lungs are making noise. Denies calf pain, SOB, hemoptysis, fever, chills. Contractions: Not present. Vag. Bleeding: None.  Movement: Present. Denies leaking of fluid.  Also reports depression Sx, fatigue. States she hasn't taken her Zoloft for the past month. States it was helping. Doesn't know why she stopped. Doesn't know where it is now. Denies SI/HI. Elevated depression screening today.   The following portions of the patient's history were reviewed and updated as appropriate: allergies, current medications, past family history, past medical history, past social history, past surgical history and problem list. Problem list updated. [facility] for Anatomy US.   Objective:   Vitals:   05/28/18 0922  BP: 113/74  Pulse: (!) 102  Temp: 98.8 F (37.1 C)  Weight: 145 lb 3.2 oz (65.9 kg)    Fetal Status: Fetal Heart Rate (bpm): 140   Movement: Present    Fundal Height 35 cm  General:  Alert, oriented and cooperative. Patient is in no  acute distress.  Skin: Skin is warm and dry. No rash noted.   Cardiovascular: Normal heart rate noted  Respiratory: Normal respiratory effort, no problems with respiration noted  Abdomen: Soft, gravid, appropriate for gestational age.  Pain/Pressure: Present     Pelvic: Cervical exam deferred        Extremities: Normal range of motion.  Edema: None. Neg Homan's, cords erythema or tenderness.   Mental Status: Normal mood and affect. Normal behavior. Normal judgment and thought content.   Assessment and Plan:  Pregnancy: G3P2002 at [redacted]w[redacted]d  1. Supervision of high risk pregnancy, antepartum  - [place] MFM OB DETAIL +14 WK; Future  2. Anemia during pregnancy in third trimester  - CBC w/Diff - [place] MFM OB DETAIL +14 WK; Future  3. History of cesarean delivery--Leaning toward repeat C/S - Brief discussion of R/B C/S vs TOLAC including increased risk of DVT/PE w/ major abdominal surgery.  - Will request repeat C/S at 39 weeks due to advanced gestation, but pt still uncertain.  - [place] MFM OB DETAIL +14 WK; Future  4. History of deep vein thrombosis (DVT) during pregnancy--No Sx of recurrence - Still declines Lovenox - [place] MFM OB DETAIL +14 WK; Future  5. Late prenatal care--DNKA for anatomy US  - [place] MFM OB DETAIL +14 WK; Future  6. Depression, unspecified depression type - Question if anemia is contributing to low energy and depression Sx. Offered Feraheme infusion. Pt agrees.  - Ambulatory referral to Integrated Behavioral Health  7. Pruritus of pregnancy, antepartum, third trimester - Discussed delivery at 37 weeks if she  tests positive.  - Bile acids, total - AST - ALT - CBC w/Diff - [place] MFM OB DETAIL +14 WK; Future  8. Pneumonia affecting pregnancy in third trimester--afebrile, feeling a little better on ABX. + wheezing - Rx Albuterol and spacer - Return to MAU if you develop fever, SOB, calf pain. - CBC w/Diff - [place] MFM OB DETAIL +14 WK; Future  9. Screening, antenatal, for  malformation by ultrasound  - [place] MFM OB DETAIL +14 WK; Future  10. [redacted] weeks gestation of pregnancy  - [place] MFM OB DETAIL +14 WK; Future   Preterm labor symptoms and general obstetric precautions including but not limited to vaginal bleeding, contractions, leaking of fluid and fetal movement were reviewed in detail with the patient. Please refer to After Visit Summary for other counseling recommendations.  Return in about 2 weeks (around 06/11/2018) for ROB.  Future Appointments  Date Time Provider Department Center  06/01/2018  9:00 AM [facility] ROOM 10 [facility] None  06/07/2018  2:00 PM [facility] [place] 3 [facility]  06/11/2018  8:15 AM Katrinka Blazing, [place], CNM [facility] WOC    [place], [place]

## 2018-05-29 LAB — CBC WITH DIFFERENTIAL/PLATELET
Basophils Absolute: 0 10*3/uL (ref 0.0–0.2)
Basos: 1 %
EOS (ABSOLUTE): 0.1 10*3/uL (ref 0.0–0.4)
Eos: 1 %
Hematocrit: 36.2 % (ref 34.0–46.6)
Hemoglobin: 11.5 g/dL (ref 11.1–15.9)
Immature Grans (Abs): 0.1 10*3/uL (ref 0.0–0.1)
Immature Granulocytes: 1 %
LYMPHS: 33 %
Lymphocytes Absolute: 2.4 10*3/uL (ref 0.7–3.1)
MCH: 26.3 pg — ABNORMAL LOW (ref 26.6–33.0)
MCHC: 31.8 g/dL (ref 31.5–35.7)
MCV: 83 fL (ref 79–97)
Monocytes Absolute: 0.6 10*3/uL (ref 0.1–0.9)
Monocytes: 8 %
Neutrophils Absolute: 4 10*3/uL (ref 1.4–7.0)
Neutrophils: 56 %
Platelets: 336 10*3/uL (ref 150–450)
RBC: 4.38 x10E6/uL (ref 3.77–5.28)
RDW: 15.2 % (ref 11.7–15.4)
WBC: 7.3 10*3/uL (ref 3.4–10.8)

## 2018-05-29 LAB — ALT: ALT: 68 IU/L — ABNORMAL HIGH (ref 0–32)

## 2018-05-29 LAB — AST: AST: 92 IU/L — AB (ref 0–40)

## 2018-05-29 LAB — BILE ACIDS, TOTAL: BILE ACIDS TOTAL: 7.6 umol/L (ref 0.0–10.0)

## 2018-05-31 ENCOUNTER — Encounter (HOSPITAL_COMMUNITY): Payer: Self-pay

## 2018-05-31 DIAGNOSIS — J189 Pneumonia, unspecified organism: Secondary | ICD-10-CM | POA: Insufficient documentation

## 2018-05-31 DIAGNOSIS — L299 Pruritus, unspecified: Secondary | ICD-10-CM | POA: Insufficient documentation

## 2018-05-31 DIAGNOSIS — O99713 Diseases of the skin and subcutaneous tissue complicating pregnancy, third trimester: Secondary | ICD-10-CM

## 2018-05-31 DIAGNOSIS — O99513 Diseases of the respiratory system complicating pregnancy, third trimester: Secondary | ICD-10-CM

## 2018-05-31 NOTE — Addendum Note (Signed)
Addended by: Reva Bores on: 05/31/2018 02:01 PM   Modules accepted: Orders

## 2018-06-01 ENCOUNTER — Telehealth: Payer: Self-pay

## 2018-06-01 ENCOUNTER — Inpatient Hospital Stay (HOSPITAL_COMMUNITY): Admission: RE | Admit: 2018-06-01 | Payer: Self-pay | Source: Ambulatory Visit

## 2018-06-01 ENCOUNTER — Telehealth: Payer: Self-pay | Admitting: Clinical

## 2018-06-01 NOTE — Telephone Encounter (Signed)
Call patient to make her aware that I got her inhaler approved by her insurance. LM to go and pick up rx.

## 2018-06-01 NOTE — Telephone Encounter (Signed)
Left HIPPA-compliant message to call back Asher Muir from [facility] [employer] at  at [phone].

## 2018-06-07 ENCOUNTER — Other Ambulatory Visit (HOSPITAL_COMMUNITY): Payer: Self-pay | Admitting: *Deleted

## 2018-06-07 ENCOUNTER — Ambulatory Visit (HOSPITAL_COMMUNITY): Payer: Medicaid Other | Admitting: *Deleted

## 2018-06-07 ENCOUNTER — Encounter (HOSPITAL_COMMUNITY): Payer: Self-pay | Admitting: *Deleted

## 2018-06-07 ENCOUNTER — Other Ambulatory Visit: Payer: Self-pay

## 2018-06-07 ENCOUNTER — Ambulatory Visit (HOSPITAL_COMMUNITY)
Admission: RE | Admit: 2018-06-07 | Discharge: 2018-06-07 | Disposition: A | Discharge status: Home / Self Care | Payer: Medicaid Other | Source: Ambulatory Visit | Attending: Advanced Practice Midwife | Admitting: Advanced Practice Midwife

## 2018-06-07 DIAGNOSIS — Z3A35 35 weeks gestation of pregnancy: Secondary | ICD-10-CM | POA: Insufficient documentation

## 2018-06-07 DIAGNOSIS — O99713 Diseases of the skin and subcutaneous tissue complicating pregnancy, third trimester: Secondary | ICD-10-CM | POA: Insufficient documentation

## 2018-06-07 DIAGNOSIS — Z86718 Personal history of other venous thrombosis and embolism: Secondary | ICD-10-CM | POA: Insufficient documentation

## 2018-06-07 DIAGNOSIS — O0933 Supervision of pregnancy with insufficient antenatal care, third trimester: Secondary | ICD-10-CM

## 2018-06-07 DIAGNOSIS — Z98891 History of uterine scar from previous surgery: Secondary | ICD-10-CM

## 2018-06-07 DIAGNOSIS — O99513 Diseases of the respiratory system complicating pregnancy, third trimester: Secondary | ICD-10-CM

## 2018-06-07 DIAGNOSIS — O0990 Supervision of high risk pregnancy, unspecified, unspecified trimester: Secondary | ICD-10-CM | POA: Insufficient documentation

## 2018-06-07 DIAGNOSIS — L299 Pruritus, unspecified: Secondary | ICD-10-CM | POA: Diagnosis present

## 2018-06-07 DIAGNOSIS — O0993 Supervision of high risk pregnancy, unspecified, third trimester: Secondary | ICD-10-CM | POA: Diagnosis not present

## 2018-06-07 DIAGNOSIS — O0930 Supervision of pregnancy with insufficient antenatal care, unspecified trimester: Secondary | ICD-10-CM | POA: Diagnosis present

## 2018-06-07 DIAGNOSIS — O99013 Anemia complicating pregnancy, third trimester: Secondary | ICD-10-CM | POA: Insufficient documentation

## 2018-06-07 DIAGNOSIS — Z363 Encounter for antenatal screening for malformations: Secondary | ICD-10-CM | POA: Diagnosis present

## 2018-06-07 DIAGNOSIS — J189 Pneumonia, unspecified organism: Secondary | ICD-10-CM | POA: Insufficient documentation

## 2018-06-07 DIAGNOSIS — Z8759 Personal history of other complications of pregnancy, childbirth and the puerperium: Secondary | ICD-10-CM | POA: Diagnosis present

## 2018-06-07 NOTE — Progress Notes (Signed)
O2 sat 100%

## 2018-06-08 ENCOUNTER — Ambulatory Visit (HOSPITAL_COMMUNITY)
Admission: RE | Admit: 2018-06-08 | Discharge: 2018-06-08 | Disposition: A | Discharge status: Home / Self Care | Payer: Medicaid Other | Source: Ambulatory Visit | Attending: Obstetrics and Gynecology | Admitting: Obstetrics and Gynecology

## 2018-06-08 DIAGNOSIS — O99013 Anemia complicating pregnancy, third trimester: Secondary | ICD-10-CM | POA: Diagnosis present

## 2018-06-08 DIAGNOSIS — Z3A36 36 weeks gestation of pregnancy: Secondary | ICD-10-CM | POA: Insufficient documentation

## 2018-06-08 MED ORDER — SODIUM CHLORIDE 0.9 % IV SOLN
510.0000 mg | INTRAVENOUS | Status: DC
  Administered 2018-06-08: 510 mg via INTRAVENOUS
  Filled 2018-06-08: qty 510

## 2018-06-08 NOTE — Discharge Instructions (Signed)

## 2018-06-11 ENCOUNTER — Other Ambulatory Visit (HOSPITAL_COMMUNITY)
Admission: RE | Admit: 2018-06-11 | Discharge: 2018-06-11 | Disposition: A | Discharge status: Home / Self Care | Payer: Medicaid Other | Source: Ambulatory Visit | Attending: Advanced Practice Midwife | Admitting: Advanced Practice Midwife

## 2018-06-11 ENCOUNTER — Ambulatory Visit (INDEPENDENT_AMBULATORY_CARE_PROVIDER_SITE_OTHER): Payer: Medicaid Other | Admitting: Advanced Practice Midwife

## 2018-06-11 ENCOUNTER — Ambulatory Visit (HOSPITAL_COMMUNITY)
Admission: RE | Admit: 2018-06-11 | Discharge: 2018-06-11 | Disposition: A | Discharge status: Home / Self Care | Payer: Medicaid Other | Source: Ambulatory Visit | Attending: Obstetrics and Gynecology | Admitting: Obstetrics and Gynecology

## 2018-06-11 ENCOUNTER — Other Ambulatory Visit: Payer: Self-pay

## 2018-06-11 VITALS — BP 119/67 | HR 88 | Temp 98.6°F | Wt 147.0 lb

## 2018-06-11 DIAGNOSIS — O0990 Supervision of high risk pregnancy, unspecified, unspecified trimester: Secondary | ICD-10-CM | POA: Insufficient documentation

## 2018-06-11 DIAGNOSIS — D649 Anemia, unspecified: Secondary | ICD-10-CM | POA: Insufficient documentation

## 2018-06-11 DIAGNOSIS — O0993 Supervision of high risk pregnancy, unspecified, third trimester: Secondary | ICD-10-CM

## 2018-06-11 DIAGNOSIS — L299 Pruritus, unspecified: Secondary | ICD-10-CM | POA: Diagnosis not present

## 2018-06-11 DIAGNOSIS — R748 Abnormal levels of other serum enzymes: Secondary | ICD-10-CM

## 2018-06-11 DIAGNOSIS — Z8759 Personal history of other complications of pregnancy, childbirth and the puerperium: Secondary | ICD-10-CM

## 2018-06-11 DIAGNOSIS — O99713 Diseases of the skin and subcutaneous tissue complicating pregnancy, third trimester: Secondary | ICD-10-CM

## 2018-06-11 DIAGNOSIS — Z3A36 36 weeks gestation of pregnancy: Secondary | ICD-10-CM | POA: Insufficient documentation

## 2018-06-11 DIAGNOSIS — O99013 Anemia complicating pregnancy, third trimester: Secondary | ICD-10-CM | POA: Diagnosis not present

## 2018-06-11 DIAGNOSIS — B373 Candidiasis of vulva and vagina: Secondary | ICD-10-CM | POA: Diagnosis not present

## 2018-06-11 DIAGNOSIS — Z86718 Personal history of other venous thrombosis and embolism: Secondary | ICD-10-CM

## 2018-06-11 DIAGNOSIS — O09293 Supervision of pregnancy with other poor reproductive or obstetric history, third trimester: Secondary | ICD-10-CM | POA: Diagnosis not present

## 2018-06-11 DIAGNOSIS — Z98891 History of uterine scar from previous surgery: Secondary | ICD-10-CM

## 2018-06-11 DIAGNOSIS — B3731 Acute candidiasis of vulva and vagina: Secondary | ICD-10-CM

## 2018-06-11 LAB — POCT URINALYSIS DIP (DEVICE)
Bilirubin Urine: NEGATIVE
Glucose, UA: NEGATIVE mg/dL
Hgb urine dipstick: NEGATIVE
Ketones, ur: NEGATIVE mg/dL
Nitrite: NEGATIVE
Protein, ur: 30 mg/dL — AB
Specific Gravity, Urine: 1.025 (ref 1.005–1.030)
Urobilinogen, UA: 0.2 mg/dL (ref 0.0–1.0)
pH: 6.5 (ref 5.0–8.0)

## 2018-06-11 MED ORDER — TERCONAZOLE 0.4 % VA CREA: 1.0000 | TOPICAL_CREAM | Freq: Every day | VAGINAL | 0 refills | Status: DC

## 2018-06-11 MED ORDER — SODIUM CHLORIDE 0.9 % IV SOLN
510.0000 mg | INTRAVENOUS | Status: DC
  Administered 2018-06-11: 510 mg via INTRAVENOUS
  Filled 2018-06-11: qty 17

## 2018-06-11 NOTE — Progress Notes (Signed)
PRENATAL VISIT NOTE  Subjective:  Erin Jacobson is a 28 y.o. G3P2002 at 66w2dbeing seen today for ongoing prenatal care.  She is currently monitored for the following issues for this high-risk pregnancy and has Supervision of high risk pregnancy, antepartum; History of cesarean delivery; History of deep vein thrombosis (DVT) during pregnancy; History of poor fetal growth; Late prenatal care; History of sexually transmitted disease; Non-compliant pregnant patient; Anxiety and depression; Anemia in pregnancy; Pruritus of pregnancy, antepartum, third trimester; and Pneumonia affecting pregnancy in third trimester on their problem list.  Patient reports: 1. Pruritis all over her body. Denies rash, N/V, epigastric pain.  2. Also has ongoing cough since Dx Flu at her PCP's office late February. Went to ED 05/23/18 w/ worsening cough and was empirically TX'd for Pneumonia. Took ABX. Still had cough and some SOB 3/6. Wheezing on exam. Rx Albuterol, taking 1-2 x per day w/ good results. No fever, SOB, calf pain, recent travel, COVID-19 contacts.  Contractions: Irritability. Vag. Bleeding: None.  Movement: Present. Denies leaking of fluid.   The following portions of the patient's history were reviewed and updated as appropriate: allergies, current medications, past family history, past medical history, past social history, past surgical history and problem list.   Objective:   Vitals:   06/11/18 0831  BP: 119/67  Pulse: 88  Temp: 98.6 F (37 C)  Weight: 147 lb (66.7 kg)    Fetal Status: Fetal Heart Rate (bpm): 145   Movement: Present  Presentation: Vertex  General:  Alert, oriented and cooperative. Patient is in no acute distress.  Skin: Skin is warm and dry. No rash noted.   Cardiovascular: Normal heart rate noted  Respiratory: Normal respiratory effort, no problems with respiration noted. No Wheezing, rhonchi, egophony   Abdomen: Soft, gravid, appropriate for gestational age.  Pain/Pressure:  Present     Pelvic: Cervical exam performed Dilation: Fingertip Effacement (%): 0 Station: -3  Extremities: Normal range of motion.  Edema: None. Negative Homan's  Mental Status: Normal mood and affect. Normal behavior. Normal judgment and thought content.   I reviewed the NST and agree with the nursing assessment.  EFM: Baseline: 145 bpm, Variability: Good {> 6 bpm), Accelerations: Reactive and Decelerations: Absent Toco: irregular, painless   Assessment and Plan:  Pregnancy: G3P2002 at 372w2d. Pruritus of pregnancy, third trimester--Unclear is elevated Liver enzymes are 2/2 ICP vs other cause.   - Bile acids, total - Hepatitis panel, acute - Comp Met (CMET) - Fetal nonstress test - Will start antenatal testing per consult w/ Dr. LeGala Romney 3. History of cesarean delivery - Plans repeat. Will determine date after ICP labs are back.   4. History of deep vein thrombosis (DVT) during pregnancy - No Sx.  - Declined Lovenox  5. Supervision of high risk pregnancy, antepartum  - [facility], beta strep (group b only)  6. Vaginal yeast infection  - terconazole (TERAZOL 7) 0.4 % vaginal cream; Place 1 applicator vaginally at bedtime.  Dispense: 45 g; Refill: 0  7. Elevated liver enzymes - Hep Panel - Fetal nonstress test  Term labor symptoms and general obstetric precautions including but not limited to vaginal bleeding, contractions, leaking of fluid and fetal movement were reviewed in detail with the patient. Please refer to After Visit Summary for other counseling recommendations.   Return in about 3 days (around 06/14/2018) for NST/AFI.  Future Appointments  Date Time Provider [place]/20/2020  2:00 PM [facility] ROOM 12 [facility] None  06/14/2018  1:15 PM WOC-WOCA NST WOC-WOCA WOC  06/17/2018  1:15 PM WOC-WOCA NST WOC-WOCA WOC  06/17/2018  2:55 PM Sloan Leiter, MD [place], [place]

## 2018-06-11 NOTE — Discharge Instructions (Signed)

## 2018-06-11 NOTE — Patient Instructions (Signed)
Cholestasis of Pregnancy    Cholestasis refers to any condition that causes the flow of digestive fluid (bile) produced by the liver to slow down or stop. Cholestasis of pregnancy is most common toward the end of pregnancy (thirdtrimester), but it can occur any time during pregnancy. The condition often goes away soon after giving birth.  Cholestasis may be uncomfortable, but it is usually harmless to you. However, it can be harmful to your baby. Cholestasis may increase the risk of:  · Your baby being born too early (preterm delivery).  · Your baby having a slow heart rate and lack of oxygen during delivery (fetal distress).  · Losing your baby before delivery (stillbirth).  What are the causes?  The exact cause of this condition is not known, but it may be related to:  · Pregnancy hormones. The gallbladder normally holds bile until you need it to help digest fat in your diet. Pregnancy hormones may cause the flow of bile to slow down and back up into your liver. Bile may then get into your bloodstream and cause cholestasis symptoms.  · Changes in your genes (genetic mutations). Specifically, genes that affect how the liver releases bile.  What increases the risk?  You are more likely to develop this condition if:  · You had cholestasis during a previous pregnancy.  · You have a family history of cholestasis.  · You have liver problems.  · You are having multiple babies, such as twins or triplets.  What are the signs or symptoms?  The most common symptom of this condition is intense itching (pruritus), especially on the palms of your hands and soles of your feet. The itching can spread to the rest of your body and is often worse at night. You will not usually have a rash. Other symptoms may include:  · Feeling tired.  · Pain in your upper right abdomen.  · Dark-colored urine.  · Light-colored stools.  · Poor appetite.  · Yellowish discoloration of your skin and the whites of your eyes (jaundice).  How is this  diagnosed?  This condition is diagnosed based on:  · Your medical history.  · A physical exam.  · Blood tests.  If you have an inherited risk for developing this condition, you may also have genetic testing.  How is this treated?  The goal of treatment is to make you comfortable and keep your baby safe. Your health care provider may:  · Prescribe medicine to reduce bile acid in your bloodstream, relieve symptoms, and help keep your baby safe.  · Give you vitamin K before delivery to prevent excessive bleeding.  · Check your baby frequently (fetal monitoring).  · Perform regular blood tests to check your bile levels and liver function until your baby is delivered.  · Recommend starting (inducing) your labor and delivery by week 36 or 37 of pregnancy, or as soon as your baby's lungs have developed enough.  Follow these instructions at home:  · Take over-the-counter and prescription medicines only as told by your health care provider.  · Take cool baths to soothe itchy skin.  · Wear comfortable, loose-fitting, cotton clothing to reduce itching.  · Keep your fingernails short to prevent skin irritation from scratching.  · Keep all follow-up visits and prenatal visits as told by your health care provider. This is important.  Contact a health care provider if:  · Your symptoms get worse, even with treatment.  · You develop pain in your right side.  ·   You have unusual swelling in your abdomen, feet, ankles, or legs.  · You have a fever.  · You are more thirsty than usual.  Get help right away if:  · You go into early labor.  · You have a headache that does not go away or causes changes in vision.  · You have nausea or you vomit.  · You have severe pain in your abdomen or shoulders.  · You have shortness of breath.  Summary  · Cholestasis of pregnancy is most common toward the end of pregnancy (thirdtrimester), but it can occur any time during your pregnancy.  · The condition often goes away soon after your baby is  born.  · The most common symptom of cholestasis of pregnancy is intense itching (pruritus), especially on the palms of your hands and soles of your feet.  · This condition may be treated with medicine, frequent monitoring, or starting (inducing) labor and delivery by week 36 or 37 of pregnancy.  This information is not intended to replace advice given to you by your health care provider. Make sure you discuss any questions you have with your health care provider.  Document Released: 03/07/2000 Document Revised: 02/23/2016 Document Reviewed: 02/23/2016  Elsevier Interactive Patient Education © 2019 [employer]

## 2018-06-11 NOTE — Progress Notes (Deleted)
   PRENATAL VISIT NOTE  Subjective:  Erin Jacobson is a 28 y.o. G3P2002 at 48w2dbeing seen today for ongoing prenatal care.  She is currently monitored for the following issues for this {Blank single:19197::"high-risk","low-risk"} pregnancy and has Supervision of high risk pregnancy, antepartum; History of cesarean delivery; History of deep vein thrombosis (DVT) during pregnancy; History of poor fetal growth; Late prenatal care; History of sexually transmitted disease; Non-compliant pregnant patient; Anxiety and depression; Anemia in pregnancy; Pruritus of pregnancy, antepartum, third trimester; and Pneumonia affecting pregnancy in third trimester on their problem list.  Patient reports {sx:14538}.  Contractions: Irritability. Vag. Bleeding: None.  Movement: Present. Denies leaking of fluid.   The following portions of the patient's history were reviewed and updated as appropriate: allergies, current medications, past family history, past medical history, past social history, past surgical history and problem list.   Objective:   Vitals:   06/11/18 0831  BP: 119/67  Pulse: 88  Temp: 98.6 F (37 C)  Weight: 147 lb (66.7 kg)    Fetal Status: Fetal Heart Rate (bpm): 145   Movement: Present     General:  Alert, oriented and cooperative. Patient is in no acute distress.  Skin: Skin is warm and dry. No rash noted.   Cardiovascular: Normal heart rate noted  Respiratory: Normal respiratory effort, no problems with respiration noted  Abdomen: Soft, gravid, appropriate for gestational age.  Pain/Pressure: Present     Pelvic: {Blank single:19197::"Cervical exam performed","Cervical exam deferred"}        Extremities: Normal range of motion.  Edema: None  Mental Status: Normal mood and affect. Normal behavior. Normal judgment and thought content.   Assessment and Plan:  Pregnancy: G3P2002 at 361w2d. Pruritus of pregnancy, third trimester *** - Bile acids, total - Hepatitis panel, acute -  Comp Met (CMET)  2. Pruritus of pregnancy, antepartum, third trimester ***  3. History of cesarean delivery ***  4. History of deep vein thrombosis (DVT) during pregnancy ***  5. Supervision of high risk pregnancy, antepartum *** - [facility]lam[facility], beta strep (group b only)  {Blank single:19197::"Term","Preterm"} labor symptoms and general obstetric precautions including but not limited to vaginal bleeding, contractions, leaking of fluid and fetal movement were reviewed in detail with the patient. Please refer to After Visit Summary for other counseling recommendations.   No follow-ups on file.  Future Appointments  Date Time Provider [place]/20/2020  2:00 PM [facility] ROOM 12 [facility] None    [place]

## 2018-06-12 LAB — COMPREHENSIVE METABOLIC PANEL
ALT: 32 IU/L (ref 0–32)
AST: 31 IU/L (ref 0–40)
Albumin/Globulin Ratio: 1.4 (ref 1.2–2.2)
Albumin: 3.9 g/dL (ref 3.9–5.0)
Alkaline Phosphatase: 114 IU/L (ref 39–117)
BUN/Creatinine Ratio: 11 (ref 9–23)
BUN: 6 mg/dL (ref 6–20)
Bilirubin Total: 0.2 mg/dL (ref 0.0–1.2)
CO2: 18 mmol/L — ABNORMAL LOW (ref 20–29)
Calcium: 9.5 mg/dL (ref 8.7–10.2)
Chloride: 102 mmol/L (ref 96–106)
Creatinine, Ser: 0.56 mg/dL — ABNORMAL LOW (ref 0.57–1.00)
GFR calc Af Amer: 148 mL/min/{1.73_m2} (ref >59)
GFR calc non Af Amer: 128 mL/min/{1.73_m2} (ref >59)
Globulin, Total: 2.8 g/dL (ref 1.5–4.5)
Glucose: 85 mg/dL (ref 65–99)
Potassium: 3.8 mmol/L (ref 3.5–5.2)
Sodium: 139 mmol/L (ref 134–144)
Total Protein: 6.7 g/dL (ref 6.0–8.5)

## 2018-06-12 LAB — HEPATITIS PANEL, ACUTE
HEP B C IGM: NEGATIVE
Hep A IgM: NEGATIVE
Hepatitis B Surface Ag: NEGATIVE

## 2018-06-12 LAB — BILE ACIDS, TOTAL: Bile Acids Total: 4.7 umol/L (ref 0.0–10.0)

## 2018-06-13 ENCOUNTER — Other Ambulatory Visit: Payer: Self-pay

## 2018-06-13 ENCOUNTER — Inpatient Hospital Stay (HOSPITAL_COMMUNITY)
Admission: AD | Admit: 2018-06-13 | Discharge: 2018-06-13 | Disposition: A | Discharge status: Home / Self Care | Payer: Medicaid Other | Source: Home / Self Care | Attending: Family Medicine | Admitting: Family Medicine

## 2018-06-13 ENCOUNTER — Encounter (HOSPITAL_COMMUNITY): Payer: Self-pay

## 2018-06-13 DIAGNOSIS — O479 False labor, unspecified: Secondary | ICD-10-CM

## 2018-06-13 DIAGNOSIS — Z3A36 36 weeks gestation of pregnancy: Secondary | ICD-10-CM | POA: Insufficient documentation

## 2018-06-13 DIAGNOSIS — O4703 False labor before 37 completed weeks of gestation, third trimester: Secondary | ICD-10-CM | POA: Insufficient documentation

## 2018-06-13 NOTE — MAU Note (Signed)
Erin Jacobson is a 28 y.o. at [redacted]w[redacted]d here in MAU reporting: contractions since 0600, states they are irregular. No bleeding, no LOF. States her mucus plug came out. Pt is for a repeat c/s. + FM  Onset of complaint: 0600  Pain score: 8/10  Vitals:   06/13/18 1719  BP: 124/75  Pulse: 94  Resp: 18  Temp: 98.5 F (36.9 C)  SpO2: 100%      Lab orders placed from triage: labor eval standing orders

## 2018-06-13 NOTE — Discharge Instructions (Signed)

## 2018-06-14 ENCOUNTER — Other Ambulatory Visit: Payer: Self-pay

## 2018-06-14 ENCOUNTER — Encounter: Payer: Self-pay | Admitting: *Deleted

## 2018-06-14 ENCOUNTER — Inpatient Hospital Stay (HOSPITAL_COMMUNITY)
Admission: AD | Admit: 2018-06-14 | Discharge: 2018-06-16 | DRG: 807 | Disposition: A | Discharge status: Home / Self Care | Payer: Medicaid Other | Attending: Obstetrics and Gynecology | Admitting: Obstetrics and Gynecology

## 2018-06-14 ENCOUNTER — Encounter (HOSPITAL_COMMUNITY): Payer: Self-pay | Admitting: *Deleted

## 2018-06-14 ENCOUNTER — Encounter: Payer: Self-pay | Admitting: Advanced Practice Midwife

## 2018-06-14 ENCOUNTER — Telehealth: Payer: Self-pay | Admitting: *Deleted

## 2018-06-14 DIAGNOSIS — F329 Major depressive disorder, single episode, unspecified: Secondary | ICD-10-CM | POA: Diagnosis present

## 2018-06-14 DIAGNOSIS — O99824 Streptococcus B carrier state complicating childbirth: Secondary | ICD-10-CM | POA: Diagnosis present

## 2018-06-14 DIAGNOSIS — O99820 Streptococcus B carrier state complicating pregnancy: Secondary | ICD-10-CM | POA: Insufficient documentation

## 2018-06-14 DIAGNOSIS — O0930 Supervision of pregnancy with insufficient antenatal care, unspecified trimester: Secondary | ICD-10-CM

## 2018-06-14 DIAGNOSIS — O99513 Diseases of the respiratory system complicating pregnancy, third trimester: Secondary | ICD-10-CM

## 2018-06-14 DIAGNOSIS — Z98891 History of uterine scar from previous surgery: Secondary | ICD-10-CM

## 2018-06-14 DIAGNOSIS — O34219 Maternal care for unspecified type scar from previous cesarean delivery: Secondary | ICD-10-CM | POA: Diagnosis present

## 2018-06-14 DIAGNOSIS — Z87891 Personal history of nicotine dependence: Secondary | ICD-10-CM | POA: Diagnosis not present

## 2018-06-14 DIAGNOSIS — J189 Pneumonia, unspecified organism: Secondary | ICD-10-CM | POA: Diagnosis present

## 2018-06-14 DIAGNOSIS — Z8759 Personal history of other complications of pregnancy, childbirth and the puerperium: Secondary | ICD-10-CM

## 2018-06-14 DIAGNOSIS — Z3A36 36 weeks gestation of pregnancy: Secondary | ICD-10-CM

## 2018-06-14 DIAGNOSIS — Z86718 Personal history of other venous thrombosis and embolism: Secondary | ICD-10-CM | POA: Diagnosis not present

## 2018-06-14 DIAGNOSIS — O99344 Other mental disorders complicating childbirth: Secondary | ICD-10-CM | POA: Diagnosis present

## 2018-06-14 DIAGNOSIS — F419 Anxiety disorder, unspecified: Secondary | ICD-10-CM | POA: Diagnosis present

## 2018-06-14 DIAGNOSIS — O0990 Supervision of high risk pregnancy, unspecified, unspecified trimester: Secondary | ICD-10-CM

## 2018-06-14 DIAGNOSIS — F32A Depression, unspecified: Secondary | ICD-10-CM

## 2018-06-14 DIAGNOSIS — Z87898 Personal history of other specified conditions: Secondary | ICD-10-CM

## 2018-06-14 LAB — CULTURE, BETA STREP (GROUP B ONLY): Strep Gp B Culture: POSITIVE — AB

## 2018-06-14 LAB — CBC
HCT: 38.8 % (ref 36.0–46.0)
Hemoglobin: 11.7 g/dL — ABNORMAL LOW (ref 12.0–15.0)
MCH: 25.7 pg — AB (ref 26.0–34.0)
MCHC: 30.2 g/dL (ref 30.0–36.0)
MCV: 85.1 fL (ref 80.0–100.0)
PLATELETS: 400 10*3/uL (ref 150–400)
RBC: 4.56 MIL/uL (ref 3.87–5.11)
RDW: 17.4 % — ABNORMAL HIGH (ref 11.5–15.5)
WBC: 14.1 10*3/uL — ABNORMAL HIGH (ref 4.0–10.5)
nRBC: 0 % (ref 0.0–0.2)

## 2018-06-14 LAB — ABO/RH: ABO/RH(D): B POS

## 2018-06-14 LAB — TYPE AND SCREEN
ABO/RH(D): B POS
Antibody Screen: NEGATIVE

## 2018-06-14 LAB — GC/CHLAMYDIA PROBE AMP (~~LOC~~) NOT AT ARMC
Chlamydia: NEGATIVE
Neisseria Gonorrhea: NEGATIVE

## 2018-06-14 MED ORDER — SIMETHICONE 80 MG PO CHEW: 80.0000 mg | CHEWABLE_TABLET | ORAL | Status: DC | PRN

## 2018-06-14 MED ORDER — DIPHENHYDRAMINE HCL 25 MG PO CAPS: 25.0000 mg | ORAL_CAPSULE | Freq: Four times a day (QID) | ORAL | Status: DC | PRN

## 2018-06-14 MED ORDER — ACETAMINOPHEN 325 MG PO TABS
650.0000 mg | ORAL_TABLET | ORAL | Status: DC | PRN
  Administered 2018-06-14 – 2018-06-15 (×4): 650 mg via ORAL
  Filled 2018-06-14 (×3): qty 2

## 2018-06-14 MED ORDER — BENZOCAINE-MENTHOL 20-0.5 % EX AERO
1.0000 "application " | INHALATION_SPRAY | CUTANEOUS | Status: DC | PRN
  Administered 2018-06-14: 1 via TOPICAL
  Filled 2018-06-14: qty 56

## 2018-06-14 MED ORDER — SODIUM CHLORIDE 0.9 % IV SOLN: 2.0000 g | Freq: Once | INTRAVENOUS | Status: DC

## 2018-06-14 MED ORDER — ONDANSETRON HCL 4 MG/2ML IJ SOLN: 4.0000 mg | INTRAMUSCULAR | Status: DC | PRN

## 2018-06-14 MED ORDER — OXYTOCIN 40 UNITS IN NORMAL SALINE INFUSION - SIMPLE MED
2.5000 [IU]/h | INTRAVENOUS | Status: DC
  Filled 2018-06-14: qty 1000

## 2018-06-14 MED ORDER — OXYCODONE HCL 5 MG PO TABS
5.0000 mg | ORAL_TABLET | Freq: Once | ORAL | Status: AC
  Administered 2018-06-14: 5 mg via ORAL
  Filled 2018-06-14: qty 1

## 2018-06-14 MED ORDER — LACTATED RINGERS IV SOLN: 500.0000 mL | INTRAVENOUS | Status: DC | PRN

## 2018-06-14 MED ORDER — FLEET ENEMA 7-19 GM/118ML RE ENEM: 1.0000 | ENEMA | RECTAL | Status: DC | PRN

## 2018-06-14 MED ORDER — FENTANYL CITRATE (PF) 100 MCG/2ML IJ SOLN: 100.0000 ug | INTRAMUSCULAR | Status: DC | PRN

## 2018-06-14 MED ORDER — COCONUT OIL OIL: 1.0000 "application " | TOPICAL_OIL | Status: DC | PRN

## 2018-06-14 MED ORDER — WITCH HAZEL-GLYCERIN EX PADS
1.0000 "application " | MEDICATED_PAD | CUTANEOUS | Status: DC | PRN
  Administered 2018-06-14: 1 via TOPICAL

## 2018-06-14 MED ORDER — TETANUS-DIPHTH-ACELL PERTUSSIS 5-2.5-18.5 LF-MCG/0.5 IM SUSP: 0.5000 mL | Freq: Once | INTRAMUSCULAR | Status: DC

## 2018-06-14 MED ORDER — SENNOSIDES-DOCUSATE SODIUM 8.6-50 MG PO TABS
2.0000 | ORAL_TABLET | ORAL | Status: DC
  Administered 2018-06-14 – 2018-06-15 (×2): 2 via ORAL
  Filled 2018-06-14 (×2): qty 2

## 2018-06-14 MED ORDER — ONDANSETRON HCL 4 MG PO TABS: 4.0000 mg | ORAL_TABLET | ORAL | Status: DC | PRN

## 2018-06-14 MED ORDER — ONDANSETRON HCL 4 MG/2ML IJ SOLN: 4.0000 mg | Freq: Four times a day (QID) | INTRAMUSCULAR | Status: DC | PRN

## 2018-06-14 MED ORDER — MEASLES, MUMPS & RUBELLA VAC IJ SOLR: 0.5000 mL | Freq: Once | INTRAMUSCULAR | Status: DC

## 2018-06-14 MED ORDER — DIBUCAINE 1 % RE OINT: 1.0000 "application " | TOPICAL_OINTMENT | RECTAL | Status: DC | PRN

## 2018-06-14 MED ORDER — LACTATED RINGERS IV SOLN
INTRAVENOUS | Status: DC
  Administered 2018-06-14: 12:00:00 via INTRAVENOUS

## 2018-06-14 MED ORDER — SOD CITRATE-CITRIC ACID 500-334 MG/5ML PO SOLN: 30.0000 mL | ORAL | Status: DC | PRN

## 2018-06-14 MED ORDER — PRENATAL MULTIVITAMIN CH
1.0000 | ORAL_TABLET | Freq: Every day | ORAL | Status: DC
  Administered 2018-06-15 – 2018-06-16 (×2): 1 via ORAL
  Filled 2018-06-14: qty 1

## 2018-06-14 MED ORDER — IBUPROFEN 600 MG PO TABS
600.0000 mg | ORAL_TABLET | Freq: Four times a day (QID) | ORAL | Status: DC
  Administered 2018-06-14 – 2018-06-16 (×9): 600 mg via ORAL
  Filled 2018-06-14 (×8): qty 1

## 2018-06-14 MED ORDER — TRAMADOL HCL 50 MG PO TABS
50.0000 mg | ORAL_TABLET | Freq: Four times a day (QID) | ORAL | Status: DC | PRN
  Administered 2018-06-14 – 2018-06-15 (×4): 50 mg via ORAL
  Filled 2018-06-14 (×4): qty 1

## 2018-06-14 MED ORDER — OXYTOCIN BOLUS FROM INFUSION
500.0000 mL | Freq: Once | INTRAVENOUS | Status: AC
  Administered 2018-06-14: 500 mL via INTRAVENOUS

## 2018-06-14 MED ORDER — LIDOCAINE HCL (PF) 1 % IJ SOLN
30.0000 mL | INTRAMUSCULAR | Status: AC | PRN
  Administered 2018-06-14: 30 mL via SUBCUTANEOUS
  Filled 2018-06-14: qty 30

## 2018-06-14 NOTE — Progress Notes (Signed)
Lab values from 3/20 visit reviewed and results discussed with Dr. Penne Lash who then reviewed pt's chart. Due to liver enzymes now normal and bile acids have decreased, pt has no indication for fetal testing. Appointment for NST/AFI today and NST/BPP on 3/26 will be cancelled. Dr. Penne Lash also recommends that appointment for 3/26 be rescheduled to early next week so that pt will only require one more appointment prior to delivery. If pt has access to a BP cuff, that visit may be a virtual visit. Pt will be contacted to determine if she has the ability to take a BP @ home. Pt also will be asked if she still prefers repeat C/S delivery. Appointment for IOL or surgery will be scheduled based on pt's preference.

## 2018-06-14 NOTE — Telephone Encounter (Signed)
Jaquesha called this am and left a message she may go to [facility] MAU - states she went yesterday and was sent home but has been having uc's since then.  States she has appt for NST this afternoon. Per chart review patient is at [facility] MAU now being evaluated.  Will send MyChart message.

## 2018-06-14 NOTE — Progress Notes (Addendum)
Notified CNM Jerene Pitch was notified that pt requested a bladder scan due not feeling she is completely emptying her bladder. Pt's pain level is a 6. Pt has a  hx of needing a cath for one moth after her C/Section delivery. The bladder scan results were .  CNM plans to order Ultram for pain management. She requests after improved pain management and after the pt voids, rescan bladder and call MD on call with results.  This pla nis reviewed with MS Rooke and the on coming RN [employer].

## 2018-06-14 NOTE — Discharge Summary (Signed)
Obstetrics Discharge Summary OB/GYN Faculty Practice   Patient Name: Erin Jacobson DOB: [birth year] MRN: [id number]  Date of admission: 06/14/2018 Delivering MD: Tamera Stands   Date of discharge: 06/16/2018  Admitting diagnosis: 36WKS,CTX Intrauterine pregnancy: [redacted]w[redacted]d     Secondary diagnosis:   Principal Problem:   Preterm labor Active Problems:   History of cesarean delivery   History of deep vein thrombosis (DVT) during pregnancy   History of poor fetal growth   Late prenatal care   Anxiety and depression   Pneumonia affecting pregnancy in third trimester   Normal labor    Discharge diagnosis: VBAC                                            Postpartum procedures: None  Complications: none  Outpatient Follow-Up: [ ]  schedule interval BTL when able (unable to perform during hospitalization because of COVID restrictions with elective surgeries) [ ]  continue to counsel on alternative methods of contraception [ ]  ensure behavioral health follow-up   Hospital course: Erin Jacobson is a 28 y.o. [redacted]w[redacted]d who was admitted for spontaneous onset of preterm labor. Her pregnancy was complicated by above noted items. Her labor course was notable for arriving in active labor. Delivery was complicated by precipitous delivery soon after arrival to L&D. Please see delivery/op note for additional details. Her postpartum course was uncomplicated. She was breastfeeding without difficulty. By day of discharge, she was passing flatus, urinating, eating and drinking without difficulty. Her pain was well-controlled, and she was discharged home with ibuprofen. She will follow-up in clinic in 1wk for mood check, 4 weeks for ppv/schedule BTL. H/O DVT- rx Lovenox 40mg  daily x 6wk.  Physical exam  Vitals:   06/15/18 0420 06/15/18 1400 06/15/18 2222 06/16/18 0500  BP: 113/77 104/70 122/71 105/69  Pulse: 100 93 (!) 105 (!) 109  Resp: 16 16 18 20   Temp: 97.9 F (36.6 C) 98.2 F (36.8 C) 99 F (37.2 C) 98.6  F (37 C)  TempSrc: Axillary Oral  Oral  SpO2:   100% 100%   General: alert/oriented/no distress Lochia: appropriate Uterine Fundus: firm Incision: N/A DVT Evaluation: No evidence of DVT seen on physical exam. Negative Homan's sign. No cords or calf tenderness. No significant calf/ankle edema. Labs: Lab Results  Component Value Date   WBC 14.1 (H) 06/14/2018   HGB 11.7 (L) 06/14/2018   HCT 38.8 06/14/2018   MCV 85.1 06/14/2018   PLT 400 06/14/2018   CMP Latest Ref Rng & Units 06/11/2018  Glucose 65 - 99 mg/dL 85  BUN 6 - 20 mg/dL 6  Creatinine 3.23 - 5.57 mg/dL 3.22(G)  Sodium 254 - 270 mmol/L 139  Potassium 3.5 - 5.2 mmol/L 3.8  Chloride 96 - 106 mmol/L 102  CO2 20 - 29 mmol/L 18(L)  Calcium 8.7 - 10.2 mg/dL 9.5  Total Protein 6.0 - 8.5 g/dL 6.7  Total Bilirubin 0.0 - 1.2 mg/dL 0.2  Alkaline Phos 39 - 117 IU/L 114  AST 0 - 40 IU/L 31  ALT 0 - 32 IU/L 32    Discharge instructions: Per After Visit Summary and "Baby and Me Booklet"  After visit meds:  Allergies as of 06/16/2018   No Known Allergies     Medication List    STOP taking these medications   aspirin 81 MG chewable tablet   ferrous gluconate 324 MG  tablet Commonly known as:  FERGON   terconazole 0.4 % vaginal cream Commonly known as:  TERAZOL 7     TAKE these medications   albuterol 108 (90 Base) MCG/ACT inhaler Commonly known as:  PROVENTIL HFA;VENTOLIN HFA Inhale 2 puffs into the lungs every 6 (six) hours as needed for wheezing or shortness of breath.   enoxaparin 40 MG/0.4ML injection Commonly known as:  LOVENOX Inject 0.4 mLs (40 mg total) into the skin daily. X 6 weeks   ibuprofen 600 MG tablet Commonly known as:  ADVIL,MOTRIN Take 1 tablet (600 mg total) by mouth every 6 (six) hours as needed for mild pain, moderate pain or cramping.   Prenatal Vitamins 28-0.8 MG Tabs Take 1 tablet by mouth daily.   sertraline 25 MG tablet Commonly known as:  Zoloft Take 3 tablets (75 mg total) by  mouth daily.       Postpartum contraception: abstinence until BTL, declines in-hospital depo or any other contraception right now Diet: Routine Diet Activity: Advance as tolerated. Pelvic rest for 6 weeks.   Follow-up Appt: No future appointments. Follow-up Visit:No follow-ups on file. Please schedule this patient for Postpartum visit in: 4 weeks with the following provider: Any provider High risk pregnancy complicated by: hx of DVT, depression/anxiety Delivery mode:  VBAC Anticipated Birth Control:  interval BTL PP Procedures needed: none  Schedule Integrated BH visit: yes  Newborn Data: Live born female  Birth Weight:   APGAR: 9, 9  Newborn Delivery   Birth date/time:  06/14/2018 11:50:00 Delivery type:  VBAC, Spontaneous     Baby Feeding: Breast Disposition:home with mother  Erin Jacobson, CNM, [facility] 06/16/2018 7:41 AM

## 2018-06-14 NOTE — H&P (Signed)
OBSTETRIC ADMISSION HISTORY AND PHYSICAL  *completed after delivery given precipitous nature* Harshita Boothman Traino is a 28 y.o. female [phone] with IUP at [redacted]w[redacted]d by L/5 presenting for active preterm labor.   Reports fetal movement. Denies vaginal bleeding. ROM in MAU for clear fluid.   She received her prenatal care at [facility].  Support person in labor: Mother   Ultrasounds . Anatomy U/S: 35w5, limited by gestational age, normal interval growth  Prenatal History/Complications: . History of cesarean delivery - arrest of descent . History of DVT . Late prenatal care . Flu A+, pneumonia this pregnancy   Past Medical History: Past Medical History:  Diagnosis Date  . Complication of anesthesia   . Depression    on meds, doing ok  . DVT of upper extremity (deep vein thrombosis) (HCC)   . GERD (gastroesophageal reflux disease)    no current meds for reflux  . Infection    UTI  . Kidney stone    stent and foley placed to pass kidney stones 05/05/2014  . PONV (postoperative nausea and vomiting)   . Trichomoniasis     Past Surgical History: Past Surgical History:  Procedure Laterality Date  . APPENDECTOMY  04/01/2004   ex. lap.  . CESAREAN SECTION N/A 06/11/2015   Procedure: CESAREAN SECTION;  Surgeon: Marlow Baars, MD;  Location: [facility];  Service: Obstetrics;  Laterality: N/A;  . CYSTOSCOPY WITH RETROGRADE PYELOGRAM, URETEROSCOPY AND STENT PLACEMENT Left 05/05/2014   Procedure: LEFT  URETEROSCOPY,  RETROGRADE PYELOGRAM,  AND STENT PLACEMENT, STONE REMOVAL;  Surgeon: Crist Fat, MD;  Location: [facility];  Service: Urology;  Laterality: Left;  . HOLMIUM LASER APPLICATION Left 05/05/2014   Procedure: HOLMIUM LASER APPLICATION;  Surgeon: Crist Fat, MD;  Location: [facility];  Service: Urology;  Laterality: Left;  . KNEE SURGERY      Obstetrical History: OB History    Gravida  3   Para  2   Term  2   Preterm      AB      Living  2     SAB      TAB      Ectopic       Multiple  0   Live Births  2           Social History: Social History   Socioeconomic History  . Marital status: Single    Spouse name: Not on file  . Number of children: Not on file  . Years of education: Not on file  . Highest education level: Not on file  Occupational History  . Not on file  Social Needs  . Financial resource strain: Not on file  . Food insecurity:    Worry: Never true    Inability: Never true  . Transportation needs:    Medical: No    Non-medical: No  Tobacco Use  . Smoking status: Former Smoker    Types: Cigars    Last attempt to quit: 09/24/2014    Years since quitting: 3.7  . Smokeless tobacco: Never Used  Substance and Sexual Activity  . Alcohol use: Not Currently    Comment: occasional/ not since pregnancy  . Drug use: No  . Sexual activity: Yes    Birth control/protection: None  Lifestyle  . Physical activity:    Days per week: Not on file    Minutes per session: Not on file  . Stress: Not on file  Relationships  . Social connections:    Talks  on phone: Not on file    Gets together: Not on file    Attends religious service: Not on file    Active member of club or organization: Not on file    Attends meetings of clubs or organizations: Not on file    Relationship status: Not on file  Other Topics Concern  . Not on file  Social History Narrative  . Not on file    Family History: Family History  Problem Relation Age of Onset  . Anesthesia problems Mother        post-op N/V, hard to wake up  . Arthritis Mother   . Cancer Maternal Grandfather     Allergies: No Known Allergies  Facility-Administered Medications Prior to Admission  Medication Dose Route Frequency Provider Last Rate Last Dose  . AeroChamber Plus Flo-Vu Medium MISC 1 each  1 each Other Once [place], [place], CNM       Medications Prior to Admission  Medication Sig Dispense Refill Last Dose  . albuterol (PROVENTIL HFA;VENTOLIN HFA) 108 (90 Base) MCG/ACT  inhaler Inhale 2 puffs into the lungs every 6 (six) hours as needed for wheezing or shortness of breath. 1 Inhaler 2 Taking  . aspirin 81 MG chewable tablet Chew 1 tablet (81 mg total) by mouth daily. (Patient not taking: Reported on 05/28/2018) 30 tablet 5 Not Taking  . ferrous gluconate (FERGON) 324 MG tablet Take 1 tablet (324 mg total) by mouth 3 (three) times daily with meals. 90 tablet 3 Taking  . Prenatal Vit-Fe Fumarate-FA (PRENATAL VITAMINS) 28-0.8 MG TABS Take 1 tablet by mouth daily.  1 Taking  . sertraline (ZOLOFT) 25 MG tablet Take 3 tablets (75 mg total) by mouth daily. 90 tablet 6 Taking  . terconazole (TERAZOL 7) 0.4 % vaginal cream Place 1 applicator vaginally at bedtime. 45 g 0      Review of Systems  All systems reviewed and negative except as stated in HPI  Blood pressure 132/77, pulse (!) 114, temperature 98.4 F (36.9 C), temperature source Oral, resp. rate 20, last menstrual period 09/30/2017, unknown if currently breastfeeding. General appearance: uncomfortable appearing, involuntary pushing during contractions Lungs: no respiratory distress Heart: regular rate  Abdomen: soft, non-tender; gravid  Pelvic: deferred Extremities: no LE edema Presentation: cephalic Fetal monitoring: 120s/mod/+a/-d Uterine activity: irregular every 2-5 minutes  Dilation: 10 Effacement (%): 80 Station: -2 Exam by:: Dorrene German RN  Prenatal labs: ABO, Rh: B/Positive/-- (12/20 1233) Antibody: Negative (12/20 1233) Rubella: 3.66 (12/20 1233) RPR: Non Reactive (01/17 0830)  HBsAg: Negative (03/20 1005)  HIV: Non Reactive (01/17 0830)  GBS:   positive  Glucola: normal 2-hr Genetic screening:  Low risk NIPS  Prenatal Transfer Tool  Maternal Diabetes: No Genetic Screening: Normal Maternal Ultrasounds/Referrals: Normal Fetal Ultrasounds or other Referrals:  None Maternal Substance Abuse:  No Significant Maternal Medications:  None Significant Maternal Lab Results:  GBS(+), flu A (+)  this pregnancy  elevated LFTs normalized, normal bile acids   No results found for this or any previous visit (from the past 24 hour(s)).  Patient Active Problem List   Diagnosis Date Noted  . Normal labor 06/14/2018  . Preterm labor 06/14/2018  . Pruritus of pregnancy, antepartum, third trimester 05/31/2018  . Pneumonia affecting pregnancy in third trimester 05/31/2018  . Anemia in pregnancy 04/12/2018  . History of poor fetal growth 04/09/2018  . Late prenatal care 04/09/2018  . History of sexually transmitted disease 04/09/2018  . Non-compliant pregnant patient 04/09/2018  . Anxiety and  depression 04/09/2018  . Supervision of high risk pregnancy, antepartum 03/12/2018  . History of cesarean delivery 03/12/2018  . History of deep vein thrombosis (DVT) during pregnancy 03/12/2018    Assessment/Plan:  ETHAL MONTNEY is a 28 y.o. G3P2002 at [redacted]w[redacted]d here for active preterm labor.  Labor: Anticipate SVD. Patient TOLAC, agreeable at this time though initially planning for repeat C/S.  -- pain control: none  Fetal Wellbeing: EFW 8lbs by Leopold's. Cephalic by sutures.  -- GBS (positive) -- continuous fetal monitoring - category I   Postpartum Planning -- breast/BTL -- RI/[x] Tdap   Yachet Mattson S. Earlene Plater, DO OB/GYN Fellow

## 2018-06-14 NOTE — Progress Notes (Signed)
Pt took Ultram and voided. Post void resudule 454 mL. Pt states she feels like she voided more successfully this time compared to last time and is only feeling slight discomfort now. Pt states pain level is currently at a 4 due to cramping. Notified CNM Clayton Bibles bladder scan amount and pts comments. Plan is to continue controlling pts pain and monitoring pt comfort throughout night.

## 2018-06-14 NOTE — Progress Notes (Signed)
Per chart review, pt is currently receiving labor evaluation @ MAU. Report of plan of care called to [facility], RN so that provider and pt can be informed.

## 2018-06-14 NOTE — MAU Note (Signed)
Was here yesterday.  Contractions now every 3-5 min. No bleeding or leaking. Was 1 cm.

## 2018-06-15 LAB — RPR: RPR Ser Ql: NONREACTIVE

## 2018-06-15 MED ORDER — SERTRALINE HCL 50 MG PO TABS
75.0000 mg | ORAL_TABLET | Freq: Every day | ORAL | Status: DC
  Administered 2018-06-15 – 2018-06-16 (×2): 75 mg via ORAL
  Filled 2018-06-15 (×2): qty 1

## 2018-06-15 NOTE — Clinical Social Work Maternal (Signed)
CLINICAL SOCIAL WORK MATERNAL/CHILD NOTE  Patient Details  Name: Erin Jacobson MRN: [id number] Date of Birth: 07/17/1990  Date:  06/15/2018  Clinical Social Worker Initiating Note:  Genesis Novosad Irwin Date/Time: Initiated:  06/15/18/1325     Child's Name:  Erin Jacobson   Biological Parents:  Mother, Father(Donyea Arcand and Stanley Jacobson DOB: 01/04/1981)   Need for Interpreter:  None   Reason for Referral:  Recent Intentional Overdose , Behavioral Health Concerns   Address:  [address] [place] [address]    Phone number:  [phone] (home)     Additional phone number:   Household Members/Support Persons (HM/SP):   Household Member/Support Person 1, Household Member/Support Person 2, Household Member/Support Person 3   HM/SP Name Relationship DOB or Age  HM/SP -1 Daphne Lamar Mother    HM/SP -2 William Frohlich Son 12/14/2009  HM/SP -3 Xander Shirk Son 06/11/2015  HM/SP -4        HM/SP -5        HM/SP -6        HM/SP -7        HM/SP -8          Natural Supports (not living in the home):  Extended Family, Other (Comment)(FOB)   Professional Supports:     Employment: Unemployed   Type of Work:     Education:  High school graduate   Homebound arranged:    Financial Resources:  Medicaid   Other Resources:  Food Stamps    Cultural/Religious Considerations Which May Impact Care:    Strengths:  Ability to meet basic needs , Pediatrician chosen, Home prepared for child    Psychotropic Medications:         Pediatrician:    [place] area  Pediatrician List:   [facility]  [place]    [place]    [place]    [place]    [place]      Pediatrician Fax Number:    Risk Factors/Current Problems:  Mental Health Concerns    Cognitive State:  Able to Concentrate , Alert    Mood/Affect:  Calm , Interested , Comfortable    CSW Assessment: CSW received consult for hx of anxiety and depression.  CSW  met with MOB to offer support and complete assessment.    MOB sitting up in bed applying lotion and infant asleep in basinet when CSW entered the room. CSW introduced self and role and explained reason for consult. MOB expressed understanding and was engaged throughout assessment. MOB stated she currently lives with her mother and 2 other children. MOB reported she is not currently employed and informed CSW she recently lost her job but is still looking. MOB stated she currently receives Food Stamps and understands to reach out to caseworker to get infant added on.   CSW inquired about MOB's mental health history. MOB open and honest with CSW and stated she has a history of PPD and MDD. MOB reported experiencing symptoms of feeling irritable, angry and defensive. MOB stated she didn't realize what was going on until about 5 or 6 months post-partum. MOB stated around 8-9 months she attempted to kill herself. Per MOB, she was IVCed and went to [facility] for 14 days where she was started on Zoloft. MOB stated Zoloft was helpful in managing her symptoms when she would take it consistently. MOB reported she was experiencing MDD prior to becoming pregnancy but stated it got worse when she became pregnant due to   not being able to eat or sleep because of nausea. MOB stated she also lost her job around that time and reported she did not leave her house for 3 months. MOB reported she attempted to kill herself in October of 2019 and was sent to [facility] for almost 3 weeks where she was again started on Zoloft. MOB stated she has downloaded a phone app that helps remind her to take her medications. MOB reported she takes life "one day at a time" and tries to stay positive. MOB stated she enjoys watching her children dance and have fun and enjoys reading magazines. MOB denied any current SI/HI or DV and reported being able to talk to her mom and cousins when she needs them. MOB confirmed she has had CPS involvement  due to oldest child being present during intentional overdose in October. Per MOB, case has been closed and CSW has confirmed this with [place] DSS.  CSW provided education regarding the baby blues period vs. perinatal mood disorders, discussed treatment and gave resources for mental health follow up if concerns arise.  CSW recommends self-evaluation during the postpartum time period using the New Mom Checklist from Postpartum Progress and encouraged MOB to contact a medical professional if symptoms are noted at any time. MOB inquired about one-on-one counseling resources. CSW provided MOB with a list of resources and with MOB's permission will complete a Healthy Start referral.   MOB reported her mother was assisting in finding more clothes and that FOB was getting a car seat and basinet for baby. MOB stated she has all other essentials for infant at this time. CSW provided review of Sudden Infant Death Syndrome (SIDS) precautions and safe sleeping habits.   MOB denied having any further questions or concerns for CSW at this time.  CSW Plan/Description:  No Further Intervention Required/No Barriers to Discharge, Sudden Infant Death Syndrome (SIDS) Education, Perinatal Mood and Anxiety Disorder (PMADs) Education, Other Information/Referral to Community Resources    Keddrick Wyne  Irwin, LCSWA 06/15/2018, 2:27 PM 

## 2018-06-15 NOTE — Progress Notes (Addendum)
POSTPARTUM PROGRESS NOTE  Post Partum Day 1  Subjective:  Erin Jacobson is a 28 y.o. [phone] s/p VBAC at [redacted]w[redacted]d.  She reports she is doing well. No acute events overnight. She denies any problems with ambulating, voiding or po intake. Denies nausea or vomiting.  Pain is well controlled.  Lochia is mild.  Objective: Blood pressure 113/77, pulse 100, temperature 97.9 F (36.6 C), temperature source Axillary, resp. rate 16, last menstrual period 09/30/2017, unknown if currently breastfeeding.  Physical Exam:  General: alert, cooperative and no distress Chest: no respiratory distress Heart:regular rate, distal pulses intact Abdomen: soft, nontender,  Uterine Fundus: firm, appropriately tender DVT Evaluation: No calf swelling or tenderness Extremities: no LE edema Skin: warm, dry  Recent Labs    06/14/18 1131  HGB 11.7*  HCT 38.8    Assessment/Plan: Erin Jacobson is a 28 y.o. [id number] s/p VBAC at [redacted]w[redacted]d   PPD#1 - Doing well, VSS  Routine postpartum care H/o Dep/Anxiety: Currently denies symptoms. Will continue to monitor. Social work to see patient. H/o DVT: Patient refused Lovenox during pregnancy. No signs of DVT on exam.  Contraception: BTL. Declines any other forms of contraception given history of suicidal thoughts while on depo. Emphasized importance of condom use until BTL can be performed. Patient agreed. Feeding: Breast Dispo: Plan for discharge PPD#2.   LOS: 1 day   Orpah Cobb, D.O. [facility] 06/15/2018, 11:06 AM   I confirm that I have verified the information documented in the Resident's note and that I have also personally reperformed the history, physical exam and all medical decision making activities of this service and have verified that all service and findings are accurately documented in this student's note.     Calvert Cantor, [place] 06/15/2018 12:04 PM

## 2018-06-15 NOTE — Plan of Care (Signed)
  Problem: Role Relationship: Goal: Ability to demonstrate positive interaction with newborn will improve Note:  Pt has not been able to keep up with infants feedings or diaper changes; pt does not update feeding log and states she does not remember. Reminded pt to update feeding log, as well as supplement feeding with breast milk she has pumped. Encouraged pt to call for latch score. No audible swallowing noted during previous latch score. Pt has also contained a flat affect during shift, but states she breast fed her other children and feels comfortable with feeding.

## 2018-06-15 NOTE — Lactation Note (Signed)
This note was copied from a baby's chart. Lactation Consultation Note  Patient Name: Boy Donnalee Wurts [id number] Date: 06/15/2018 Reason for consult: Initial assessment;Late-preterm 34-36.6wks Baby is 23 hours old/36.[redacted] weeks gestation.  Mom breastfed her last baby for 2 years.  Discussed late preterm feeding behavior.  Mom is latching baby with feeding cues but states he becomes sleepy at breast and then acts hungry after. Encouraged to feed skin to skin. Baby is receiving formula supplementation. She is pumping with symphony pump.  Mom does have a breast pump at home.  Instructed to continue to put baby to breast with cues and call for assist prn, post pump x 15 minutes and supplement with formula per feeding guidelines.  LPT information sheet and breastfeeding consultation services handout given.  Maternal Data Has patient been taught Hand Expression?: Yes Does the patient have breastfeeding experience prior to this delivery?: Yes  Feeding Feeding Type: Breast Fed Nipple Type: Slow - flow  LATCH Score                   Interventions    Lactation Tools Discussed/Used     Consult Status Consult Status: Follow-up Date: 06/16/18 Follow-up type: In-patient    Huston Foley 06/15/2018, 11:08 AM

## 2018-06-16 MED ORDER — IBUPROFEN 600 MG PO TABS: 600.0000 mg | ORAL_TABLET | Freq: Four times a day (QID) | ORAL | 0 refills | Status: DC | PRN

## 2018-06-16 MED ORDER — ENOXAPARIN SODIUM 40 MG/0.4ML ~~LOC~~ SOLN: 40.0000 mg | SUBCUTANEOUS | 1 refills | Status: DC

## 2018-06-16 NOTE — Discharge Instructions (Signed)
Postpartum Care After Vaginal Delivery °This sheet gives you information about how to care for yourself from the time you deliver your baby to up to 6-12 weeks after delivery (postpartum period). Your health care provider may also give you more specific instructions. If you have problems or questions, contact your health care provider. °Follow these instructions at home: °Vaginal bleeding °· It is normal to have vaginal bleeding (lochia) after delivery. Wear a sanitary pad for vaginal bleeding and discharge. °? During the first week after delivery, the amount and appearance of lochia is often similar to a menstrual period. °? Over the next few weeks, it will gradually decrease to a dry, yellow-brown discharge. °? For most women, lochia stops completely by 4-6 weeks after delivery. Vaginal bleeding can vary from woman to woman. °· Change your sanitary pads frequently. Watch for any changes in your flow, such as: °? A sudden increase in volume. °? A change in color. °? Large blood clots. °· If you pass a blood clot from your vagina, save it and call your health care provider to discuss. Do not flush blood clots down the toilet before talking with your health care provider. °· Do not use tampons or douches until your health care provider says this is safe. °· If you are not breastfeeding, your period should return 6-8 weeks after delivery. If you are feeding your child breast milk only (exclusive breastfeeding), your period may not return until you stop breastfeeding. °Perineal care °· Keep the area between the vagina and the anus (perineum) clean and dry as told by your health care provider. Use medicated pads and pain-relieving sprays and creams as directed. °· If you had a cut in the perineum (episiotomy) or a tear in the vagina, check the area for signs of infection until you are healed. Check for: °? More redness, swelling, or pain. °? Fluid or blood coming from the cut or tear. °? Warmth. °? Pus or a bad  smell. °· You may be given a squirt bottle to use instead of wiping to clean the perineum area after you go to the bathroom. As you start healing, you may use the squirt bottle before wiping yourself. Make sure to wipe gently. °· To relieve pain caused by an episiotomy, a tear in the vagina, or swollen veins in the anus (hemorrhoids), try taking a warm sitz bath 2-3 times a day. A sitz bath is a warm water bath that is taken while you are sitting down. The water should only come up to your hips and should cover your buttocks. °Breast care °· Within the first few days after delivery, your breasts may feel heavy, full, and uncomfortable (breast engorgement). Milk may also leak from your breasts. Your health care provider can suggest ways to help relieve the discomfort. Breast engorgement should go away within a few days. °· If you are breastfeeding: °? Wear a bra that supports your breasts and fits you well. °? Keep your nipples clean and dry. Apply creams and ointments as told by your health care provider. °? You may need to use breast pads to absorb milk that leaks from your breasts. °? You may have uterine contractions every time you breastfeed for up to several weeks after delivery. Uterine contractions help your uterus return to its normal size. °? If you have any problems with breastfeeding, work with your health care provider or lactation consultant. °· If you are not breastfeeding: °? Avoid touching your breasts a lot. Doing this can make   your breasts produce more milk. °? Wear a good-fitting bra and use cold packs to help with swelling. °? Do not squeeze out (express) milk. This causes you to make more milk. °Intimacy and sexuality °· Ask your health care provider when you can engage in sexual activity. This may depend on: °? Your risk of infection. °? How fast you are healing. °? Your comfort and desire to engage in sexual activity. °· You are able to get pregnant after delivery, even if you have not had  your period. If desired, talk with your health care provider about methods of birth control (contraception). °Medicines °· Take over-the-counter and prescription medicines only as told by your health care provider. °· If you were prescribed an antibiotic medicine, take it as told by your health care provider. Do not stop taking the antibiotic even if you start to feel better. °Activity °· Gradually return to your normal activities as told by your health care provider. Ask your health care provider what activities are safe for you. °· Rest as much as possible. Try to rest or take a nap while your baby is sleeping. °Eating and drinking ° °· Drink enough fluid to keep your urine pale yellow. °· Eat high-fiber foods every day. These may help prevent or relieve constipation. High-fiber foods include: °? Whole grain cereals and breads. °? Brown rice. °? Beans. °? Fresh fruits and vegetables. °· Do not try to lose weight quickly by cutting back on calories. °· Take your prenatal vitamins until your postpartum checkup or until your health care provider tells you it is okay to stop. °Lifestyle °· Do not use any products that contain nicotine or tobacco, such as cigarettes and e-cigarettes. If you need help quitting, ask your health care provider. °· Do not drink alcohol, especially if you are breastfeeding. °General instructions °· Keep all follow-up visits for you and your baby as told by your health care provider. Most women visit their health care provider for a postpartum checkup within the first 3-6 weeks after delivery. °Contact a health care provider if: °· You feel unable to cope with the changes that your child brings to your life, and these feelings do not go away. °· You feel unusually sad or worried. °· Your breasts become red, painful, or hard. °· You have a fever. °· You have trouble holding urine or keeping urine from leaking. °· You have little or no interest in activities you used to enjoy. °· You have not  breastfed at all and you have not had a menstrual period for 12 weeks after delivery. °· You have stopped breastfeeding and you have not had a menstrual period for 12 weeks after you stopped breastfeeding. °· You have questions about caring for yourself or your baby. °· You pass a blood clot from your vagina. °Get help right away if: °· You have chest pain. °· You have difficulty breathing. °· You have sudden, severe leg pain. °· You have severe pain or cramping in your lower abdomen. °· You bleed from your vagina so much that you fill more than one sanitary pad in one hour. Bleeding should not be heavier than your heaviest period. °· You develop a severe headache. °· You faint. °· You have blurred vision or spots in your vision. °· You have bad-smelling vaginal discharge. °· You have thoughts about hurting yourself or your baby. °If you ever feel like you may hurt yourself or others, or have thoughts about taking your own life, get help   right away. You can go to the nearest emergency department or call:  Your local emergency services (911 in the [place]  A suicide crisis helpline, such as the National Suicide Prevention Lifeline at (317)013-7802. This is open 24 hours a day. Summary  The period of time right after you deliver your newborn up to 6-12 weeks after delivery is called the postpartum period.  Gradually return to your normal activities as told by your health care provider.  Keep all follow-up visits for you and your baby as told by your health care provider. This information is not intended to replace advice given to you by your health care provider. Make sure you discuss any questions you have with your health care provider. Document Released: 01/05/2007 Document Revised: 12/22/2016 Document Reviewed: 12/22/2016 Elsevier Interactive Patient Education  2019 [employer].   Breastfeeding  Choosing to breastfeed is one of the best decisions you can make for yourself and your baby. A  change in hormones during pregnancy causes your breasts to make breast milk in your milk-producing glands. Hormones prevent breast milk from being released before your baby is born. They also prompt milk flow after birth. Once breastfeeding has begun, thoughts of your baby, as well as his or her sucking or crying, can stimulate the release of milk from your milk-producing glands. Benefits of breastfeeding Research shows that breastfeeding offers many health benefits for infants and mothers. It also offers a cost-free and convenient way to feed your baby. For your baby  Your first milk (colostrum) helps your baby's digestive system to function better.  Special cells in your milk (antibodies) help your baby to fight off infections.  Breastfed babies are less likely to develop asthma, allergies, obesity, or type 2 diabetes. They are also at lower risk for sudden infant death syndrome (SIDS).  Nutrients in breast milk are better able to meet your babys needs compared to infant formula.  Breast milk improves your baby's brain development. For you  Breastfeeding helps to create a very special bond between you and your baby.  Breastfeeding is convenient. Breast milk costs nothing and is always available at the correct temperature.  Breastfeeding helps to burn calories. It helps you to lose the weight that you gained during pregnancy.  Breastfeeding makes your uterus return faster to its size before pregnancy. It also slows bleeding (lochia) after you give birth.  Breastfeeding helps to lower your risk of developing type 2 diabetes, osteoporosis, rheumatoid arthritis, cardiovascular disease, and breast, ovarian, uterine, and endometrial cancer later in life. Breastfeeding basics Starting breastfeeding  Find a comfortable place to sit or lie down, with your neck and back well-supported.  Place a pillow or a rolled-up blanket under your baby to bring him or her to the level of your breast (if  you are seated). Nursing pillows are specially designed to help support your arms and your baby while you breastfeed.  Make sure that your baby's tummy (abdomen) is facing your abdomen.  Gently massage your breast. With your fingertips, massage from the outer edges of your breast inward toward the nipple. This encourages milk flow. If your milk flows slowly, you may need to continue this action during the feeding.  Support your breast with 4 fingers underneath and your thumb above your nipple (make the letter "C" with your hand). Make sure your fingers are well away from your nipple and your babys mouth.  Stroke your baby's lips gently with your finger or nipple.  When your baby's mouth is  open wide enough, quickly bring your baby to your breast, placing your entire nipple and as much of the areola as possible into your baby's mouth. The areola is the colored area around your nipple. ? More areola should be visible above your baby's upper lip than below the lower lip. ? Your baby's lips should be opened and extended outward (flanged) to ensure an adequate, comfortable latch. ? Your baby's tongue should be between his or her lower gum and your breast.  Make sure that your baby's mouth is correctly positioned around your nipple (latched). Your baby's lips should create a seal on your breast and be turned out (everted).  It is common for your baby to suck about 2-3 minutes in order to start the flow of breast milk. Latching Teaching your baby how to latch onto your breast properly is very important. An improper latch can cause nipple pain, decreased milk supply, and poor weight gain in your baby. Also, if your baby is not latched onto your nipple properly, he or she may swallow some air during feeding. This can make your baby fussy. Burping your baby when you switch breasts during the feeding can help to get rid of the air. However, teaching your baby to latch on properly is still the best way to  prevent fussiness from swallowing air while breastfeeding. Signs that your baby has successfully latched onto your nipple  Silent tugging or silent sucking, without causing you pain. Infant's lips should be extended outward (flanged).  Swallowing heard between every 3-4 sucks once your milk has started to flow (after your let-down milk reflex occurs).  Muscle movement above and in front of his or her ears while sucking. Signs that your baby has not successfully latched onto your nipple  Sucking sounds or smacking sounds from your baby while breastfeeding.  Nipple pain. If you think your baby has not latched on correctly, slip your finger into the corner of your babys mouth to break the suction and place it between your baby's gums. Attempt to start breastfeeding again. Signs of successful breastfeeding Signs from your baby  Your baby will gradually decrease the number of sucks or will completely stop sucking.  Your baby will fall asleep.  Your baby's body will relax.  Your baby will retain a small amount of milk in his or her mouth.  Your baby will let go of your breast by himself or herself. Signs from you  Breasts that have increased in firmness, weight, and size 1-3 hours after feeding.  Breasts that are softer immediately after breastfeeding.  Increased milk volume, as well as a change in milk consistency and color by the fifth day of breastfeeding.  Nipples that are not sore, cracked, or bleeding. Signs that your baby is getting enough milk  Wetting at least 1-2 diapers during the first 24 hours after birth.  Wetting at least 5-6 diapers every 24 hours for the first week after birth. The urine should be clear or pale yellow by the age of 5 days.  Wetting 6-8 diapers every 24 hours as your baby continues to grow and develop.  At least 3 stools in a 24-hour period by the age of 5 days. The stool should be soft and yellow.  At least 3 stools in a 24-hour period by the  age of 7 days. The stool should be seedy and yellow.  No loss of weight greater than 10% of birth weight during the first 3 days of life.  Average weight  gain of 4-7 oz (113-198 g) per week after the age of 4 days.  Consistent daily weight gain by the age of 5 days, without weight loss after the age of 2 weeks. After a feeding, your baby may spit up a small amount of milk. This is normal. Breastfeeding frequency and duration Frequent feeding will help you make more milk and can prevent sore nipples and extremely full breasts (breast engorgement). Breastfeed when you feel the need to reduce the fullness of your breasts or when your baby shows signs of hunger. This is called "breastfeeding on demand." Signs that your baby is hungry include:  Increased alertness, activity, or restlessness.  Movement of the head from side to side.  Opening of the mouth when the corner of the mouth or cheek is stroked (rooting).  Increased sucking sounds, smacking lips, cooing, sighing, or squeaking.  Hand-to-mouth movements and sucking on fingers or hands.  Fussing or crying. Avoid introducing a pacifier to your baby in the first 4-6 weeks after your baby is born. After this time, you may choose to use a pacifier. Research has shown that pacifier use during the first year of a baby's life decreases the risk of sudden infant death syndrome (SIDS). Allow your baby to feed on each breast as long as he or she wants. When your baby unlatches or falls asleep while feeding from the first breast, offer the second breast. Because newborns are often sleepy in the first few weeks of life, you may need to awaken your baby to get him or her to feed. Breastfeeding times will vary from baby to baby. However, the following rules can serve as a guide to help you make sure that your baby is properly fed:  Newborns (babies 48 weeks of age or younger) may breastfeed every 1-3 hours.  Newborns should not go without breastfeeding  for longer than 3 hours during the day or 5 hours during the night.  You should breastfeed your baby a minimum of 8 times in a 24-hour period. Breast milk pumping     Pumping and storing breast milk allows you to make sure that your baby is exclusively fed your breast milk, even at times when you are unable to breastfeed. This is especially important if you go back to work while you are still breastfeeding, or if you are not able to be present during feedings. Your lactation consultant can help you find a method of pumping that works best for you and give you guidelines about how long it is safe to store breast milk. Caring for your breasts while you breastfeed Nipples can become dry, cracked, and sore while breastfeeding. The following recommendations can help keep your breasts moisturized and healthy:  Avoid using soap on your nipples.  Wear a supportive bra designed especially for nursing. Avoid wearing underwire-style bras or extremely tight bras (sports bras).  Air-dry your nipples for 3-4 minutes after each feeding.  Use only cotton bra pads to absorb leaked breast milk. Leaking of breast milk between feedings is normal.  Use lanolin on your nipples after breastfeeding. Lanolin helps to maintain your skin's normal moisture barrier. Pure lanolin is not harmful (not toxic) to your baby. You may also hand express a few drops of breast milk and gently massage that milk into your nipples and allow the milk to air-dry. In the first few weeks after giving birth, some women experience breast engorgement. Engorgement can make your breasts feel heavy, warm, and tender to the touch. Engorgement  peaks within 3-5 days after you give birth. The following recommendations can help to ease engorgement:  Completely empty your breasts while breastfeeding or pumping. You may want to start by applying warm, moist heat (in the shower or with warm, water-soaked hand towels) just before feeding or pumping. This  increases circulation and helps the milk flow. If your baby does not completely empty your breasts while breastfeeding, pump any extra milk after he or she is finished.  Apply ice packs to your breasts immediately after breastfeeding or pumping, unless this is too uncomfortable for you. To do this: ? Put ice in a plastic bag. ? Place a towel between your skin and the bag. ? Leave the ice on for 20 minutes, 2-3 times a day.  Make sure that your baby is latched on and positioned properly while breastfeeding. If engorgement persists after 48 hours of following these recommendations, contact your health care provider or a Advertising copywriter. Overall health care recommendations while breastfeeding  Eat 3 healthy meals and 3 snacks every day. Well-nourished mothers who are breastfeeding need an additional 450-500 calories a day. You can meet this requirement by increasing the amount of a balanced diet that you eat.  Drink enough water to keep your urine pale yellow or clear.  Rest often, relax, and continue to take your prenatal vitamins to prevent fatigue, stress, and low vitamin and mineral levels in your body (nutrient deficiencies).  Do not use any products that contain nicotine or tobacco, such as cigarettes and e-cigarettes. Your baby may be harmed by chemicals from cigarettes that pass into breast milk and exposure to secondhand smoke. If you need help quitting, ask your health care provider.  Avoid alcohol.  Do not use illegal drugs or marijuana.  Talk with your health care provider before taking any medicines. These include over-the-counter and prescription medicines as well as vitamins and herbal supplements. Some medicines that may be harmful to your baby can pass through breast milk.  It is possible to become pregnant while breastfeeding. If birth control is desired, ask your health care provider about options that will be safe while breastfeeding your baby. Where to find more  information: [employer]: www.llli.org Contact a health care provider if:  You feel like you want to stop breastfeeding or have become frustrated with breastfeeding.  Your nipples are cracked or bleeding.  Your breasts are red, tender, or warm.  You have: ? Painful breasts or nipples. ? A swollen area on either breast. ? A fever or chills. ? Nausea or vomiting. ? Drainage other than breast milk from your nipples.  Your breasts do not become full before feedings by the fifth day after you give birth.  You feel sad and depressed.  Your baby is: ? Too sleepy to eat well. ? Having trouble sleeping. ? More than 67 week old and wetting fewer than 6 diapers in a 24-hour period. ? Not gaining weight by 2 days of age.  Your baby has fewer than 3 stools in a 24-hour period.  Your baby's skin or the white parts of his or her eyes become yellow. Get help right away if:  Your baby is overly tired (lethargic) and does not want to wake up and feed.  Your baby develops an unexplained fever. Summary  Breastfeeding offers many health benefits for infant and mothers.  Try to breastfeed your infant when he or she shows early signs of hunger.  Gently tickle or stroke your baby's lips with  your finger or nipple to allow the baby to open his or her mouth. Bring the baby to your breast. Make sure that much of the areola is in your baby's mouth. Offer one side and burp the baby before you offer the other side.  Talk with your health care provider or lactation consultant if you have questions or you face problems as you breastfeed. This information is not intended to replace advice given to you by your health care provider. Make sure you discuss any questions you have with your health care provider. Document Released: 03/10/2005 Document Revised: 04/11/2016 Document Reviewed: 04/11/2016 Elsevier Interactive Patient Education  2019 [employer]

## 2018-06-17 ENCOUNTER — Encounter: Payer: Self-pay | Admitting: Obstetrics and Gynecology

## 2018-06-17 ENCOUNTER — Other Ambulatory Visit: Payer: Self-pay

## 2018-07-01 ENCOUNTER — Encounter (HOSPITAL_COMMUNITY): Admission: RE | Admit: 2018-07-01 | Payer: Medicaid Other | Source: Ambulatory Visit

## 2018-07-02 ENCOUNTER — Encounter (HOSPITAL_COMMUNITY)
Admission: AD | Disposition: A | Discharge status: Home / Self Care | Payer: Self-pay | Source: Home / Self Care | Attending: Obstetrics and Gynecology

## 2018-07-02 SURGERY — Surgical Case
Anesthesia: Regional

## 2018-08-09 ENCOUNTER — Telehealth: Payer: Self-pay | Admitting: Obstetrics and Gynecology

## 2018-08-09 ENCOUNTER — Ambulatory Visit: Payer: Medicaid Other | Admitting: Clinical

## 2018-08-09 NOTE — Telephone Encounter (Signed)
The patient called in to schedule an appointment. Informed of virtual however the patient stated she needs to come in due to constant leakage. She stated she feels as if she is urinating on herself. She stated she is unsure if it is discharge. She had a similar issue after her last pregnancy with her last child. She feels she may need to see a urologist.

## 2018-08-09 NOTE — BH Specialist Note (Signed)
Attempt to contact patient to set up [employer] video visit. Pt did not arrive to virtual visit (after MyChart notification), and did not answer the phone. Left HIPPA-compliant message to call back Asher Muir from [facility] [employer] at [phone]; MyChart message sent.   [facility] Follow Up Visit  MRN: [id number] Name: Erin Jacobson [facility], LCSW

## 2018-08-10 ENCOUNTER — Other Ambulatory Visit: Payer: Self-pay

## 2018-08-10 ENCOUNTER — Telehealth (INDEPENDENT_AMBULATORY_CARE_PROVIDER_SITE_OTHER): Payer: Medicaid Other | Admitting: Obstetrics & Gynecology

## 2018-08-10 ENCOUNTER — Other Ambulatory Visit (HOSPITAL_COMMUNITY)
Admission: RE | Admit: 2018-08-10 | Discharge: 2018-08-10 | Disposition: A | Discharge status: Home / Self Care | Payer: Medicaid Other | Source: Ambulatory Visit | Attending: Obstetrics & Gynecology | Admitting: Obstetrics & Gynecology

## 2018-08-10 ENCOUNTER — Other Ambulatory Visit: Payer: Self-pay | Admitting: Obstetrics & Gynecology

## 2018-08-10 DIAGNOSIS — B3731 Acute candidiasis of vulva and vagina: Secondary | ICD-10-CM

## 2018-08-10 DIAGNOSIS — B373 Candidiasis of vulva and vagina: Secondary | ICD-10-CM | POA: Insufficient documentation

## 2018-08-10 DIAGNOSIS — N898 Other specified noninflammatory disorders of vagina: Secondary | ICD-10-CM | POA: Diagnosis present

## 2018-08-10 DIAGNOSIS — R32 Unspecified urinary incontinence: Secondary | ICD-10-CM

## 2018-08-10 NOTE — Progress Notes (Signed)
TELEHEALTH POSTPARTUM VIRTUAL VIDEO VISIT ENCOUNTER NOTE  Provider [facility]   I connected with Erin Jacobson on 08/10/18 at  2:55 PM EDT by MyChart Video Encounter at home and verified that I am speaking with the correct person using two identifiers.    I discussed the limitations, risks, security and privacy concerns of performing an evaluation and management service by telephone and the availability of in person appointments. I also discussed with the patient that there may be a patient responsible charge related to this service. The patient expressed understanding and agreed to proceed.  Appointment Date: 08/10/2018  OBGYN Clinic: [facility]  Chief Complaint: Postpartum Visit  History of Present Illness: Erin Jacobson is a 28 y.o. African-American [id number] being evaluated for postpartum followup.  She had a VBAC.  She is s/p normal spontaneous vaginal delivery on  weeks; she was discharged to home.   Complains of a vaginal discharge  Vaginal bleeding or discharge: Yes  Intercourse: No  Contraception: condoms Mode of feeding infant: Breast PP depression s/s: Yes . She is not taking zoloft daily but she is taking it once per week. She says that she will start taking it daily. Any bowel or bladder issues: Yes  Pap smear: normal in 2019  Review of Systems:  Her 12 point review of systems is negative or as noted in the History of Present Illness.  Patient Active Problem List   Diagnosis Date Noted  . Normal labor 06/14/2018  . Preterm labor 06/14/2018  . Group B Streptococcus carrier, antepartum 06/14/2018  . Pruritus of pregnancy, antepartum, third trimester 05/31/2018  . Pneumonia affecting pregnancy in third trimester 05/31/2018  . Anemia in pregnancy 04/12/2018  . History of poor fetal growth 04/09/2018  . Late prenatal care 04/09/2018  . History of sexually transmitted disease 04/09/2018  . Non-compliant pregnant patient 04/09/2018   . Anxiety and depression 04/09/2018  . Supervision of high risk pregnancy, antepartum 03/12/2018  . History of cesarean delivery 03/12/2018  . History of deep vein thrombosis (DVT) during pregnancy 03/12/2018    Medications Tonetta M. Denny Peonvery had no medications administered during this visit. Current Outpatient Medications  Medication Sig Dispense Refill  . albuterol (PROVENTIL HFA;VENTOLIN HFA) 108 (90 Base) MCG/ACT inhaler Inhale 2 puffs into the lungs every 6 (six) hours as needed for wheezing or shortness of breath. 1 Inhaler 2  . enoxaparin (LOVENOX) 40 MG/0.4ML injection Inject 0.4 mLs (40 mg total) into the skin daily. X 6 weeks 30 Syringe 1  . ibuprofen (ADVIL,MOTRIN) 600 MG tablet Take 1 tablet (600 mg total) by mouth every 6 (six) hours as needed for mild pain, moderate pain or cramping. 30 tablet 0  . ibuprofen (ADVIL,MOTRIN) 600 MG tablet Take 600 mg by mouth every 6 (six) hours as needed (for pain.).     Marland Kitchen. Prenatal Vit-Fe Fumarate-FA (PRENATAL VITAMINS) 28-0.8 MG TABS Take 1 tablet by mouth daily.  1  . sertraline (ZOLOFT) 25 MG tablet Take 3 tablets (75 mg total) by mouth daily. 90 tablet 6   Current Facility-Administered Medications  Medication Dose Route Frequency Provider Last Rate Last Dose  . AeroChamber Plus Flo-Vu Medium MISC 1 each  1 each Other Once [place], [place], CNM        Allergies Patient has no known allergies.  Physical Exam:  LMP 09/30/2017 (Exact Date)  Reviewed from Babyscripts General:  Alert, oriented and cooperative. Patient is in no acute distress.  Mental Status:  Normal mood and affect. Normal behavior. Normal judgment and thought content.   Respiratory: Normal respiratory effort noted, no problems with respiration noted  Rest of physical exam deferred due to type of encounter  PP Depression Screening:    Assessment:Patient is a 28 y.o. [id number] who is 7  weeks postpartum from a normal spontaneous vaginal delivery.  She is doing well.   Plan:   1. Vaginal yeast infection  - Cervicovaginal ancillary only( [place])  2. Vaginal discharge  - Cervicovaginal ancillary only( [place])   RTC  I discussed the assessment and treatment plan with the patient. The patient was provided an opportunity to ask questions and all were answered. The patient agreed with the plan and demonstrated an understanding of the instructions.   The patient was advised to call back or seek an in-person evaluation/go to the ED for any concerning postpartum symptoms.  I provided 10 minutes of face-to-face time during this encounter.   Allie Bossier, MD [facility], [facility]

## 2018-08-11 LAB — CERVICOVAGINAL ANCILLARY ONLY
Bacterial vaginitis: POSITIVE — AB
Candida vaginitis: NEGATIVE

## 2018-08-12 ENCOUNTER — Other Ambulatory Visit: Payer: Self-pay | Admitting: Obstetrics & Gynecology

## 2018-08-12 MED ORDER — METRONIDAZOLE 500 MG PO TABS: 500.0000 mg | ORAL_TABLET | Freq: Two times a day (BID) | ORAL | 0 refills | Status: DC

## 2018-08-12 NOTE — Progress Notes (Signed)
Flagyl prescribed for BV 

## 2018-08-17 ENCOUNTER — Telehealth: Payer: Self-pay | Admitting: *Deleted

## 2018-08-17 NOTE — Telephone Encounter (Signed)
Called pt to advise her that she needed to come in and resign BTL papers as hers would expire prior to her surgery.  Pt did not pick up.  Left message advising pt that she was being contacted regarding paperwork and requesting she call the clinic to discuss.

## 2018-08-17 NOTE — Telephone Encounter (Signed)
Called pt to advise her that she did not need to come in and resign the BTL papers per [place].  Pt did not pick up but her mother did and stated that the pt was not available.  Left message with the mother stating that the pt could disregard my previous message about paperwork and if she had any questions she could call the clinic.

## 2018-08-17 NOTE — Telephone Encounter (Signed)
-----   Message from Darrel Hoover, RN sent at 08/17/2018 11:41 AM EDT ----- Regarding: RE: Needs to Resign BTL Papers Morrie Sheldon, You originally signed these papers with this patient.  Would you please call her and get her to come in to resign? Thanks, Tresa Endo ----- Message ----- From: Olevia Bowens Sent: 08/13/2018  10:57 AM EDT To: Darrel Hoover, RN, Allie Bossier, MD, # Subject: Needs to Resign BTL Papers                     Please have her come in and resign BTL papers. Dr. Marice Potter is out of OR time until the August schedule is published. Her papers will expire. ----- Message ----- From: Allie Bossier, MD Sent: 08/10/2018   3:26 PM EDT To: Tera Partridge Battle  Please schedule her for a laparoscopic application of Filsche clips. She signed the medicaid forms on 04/09/18. Thanks

## 2018-09-21 ENCOUNTER — Encounter (HOSPITAL_BASED_OUTPATIENT_CLINIC_OR_DEPARTMENT_OTHER): Payer: Self-pay | Admitting: *Deleted

## 2018-09-21 ENCOUNTER — Other Ambulatory Visit: Payer: Self-pay

## 2018-09-27 ENCOUNTER — Other Ambulatory Visit (HOSPITAL_COMMUNITY)
Admission: RE | Admit: 2018-09-27 | Discharge: 2018-09-27 | Disposition: A | Discharge status: Home / Self Care | Payer: Medicaid Other | Source: Ambulatory Visit | Attending: Obstetrics & Gynecology | Admitting: Obstetrics & Gynecology

## 2018-09-27 DIAGNOSIS — Z01812 Encounter for preprocedural laboratory examination: Secondary | ICD-10-CM | POA: Diagnosis present

## 2018-09-27 DIAGNOSIS — Z1159 Encounter for screening for other viral diseases: Secondary | ICD-10-CM | POA: Insufficient documentation

## 2018-09-28 LAB — SARS CORONAVIRUS 2 (TAT 6-24 HRS): SARS Coronavirus 2: NEGATIVE

## 2018-09-29 ENCOUNTER — Ambulatory Visit (HOSPITAL_BASED_OUTPATIENT_CLINIC_OR_DEPARTMENT_OTHER): Payer: Medicaid Other | Admitting: Certified Registered Nurse Anesthetist

## 2018-09-29 ENCOUNTER — Ambulatory Visit (HOSPITAL_BASED_OUTPATIENT_CLINIC_OR_DEPARTMENT_OTHER)
Admission: RE | Admit: 2018-09-29 | Discharge: 2018-09-29 | Disposition: A | Discharge status: Home / Self Care | Payer: Medicaid Other | Attending: Obstetrics & Gynecology | Admitting: Obstetrics & Gynecology

## 2018-09-29 ENCOUNTER — Encounter (HOSPITAL_BASED_OUTPATIENT_CLINIC_OR_DEPARTMENT_OTHER)
Admission: RE | Disposition: A | Discharge status: Home / Self Care | Payer: Self-pay | Source: Home / Self Care | Attending: Obstetrics & Gynecology

## 2018-09-29 ENCOUNTER — Other Ambulatory Visit: Payer: Self-pay

## 2018-09-29 ENCOUNTER — Encounter (HOSPITAL_BASED_OUTPATIENT_CLINIC_OR_DEPARTMENT_OTHER): Payer: Self-pay | Admitting: Certified Registered Nurse Anesthetist

## 2018-09-29 DIAGNOSIS — Z302 Encounter for sterilization: Secondary | ICD-10-CM

## 2018-09-29 DIAGNOSIS — F329 Major depressive disorder, single episode, unspecified: Secondary | ICD-10-CM | POA: Insufficient documentation

## 2018-09-29 DIAGNOSIS — Z87891 Personal history of nicotine dependence: Secondary | ICD-10-CM | POA: Diagnosis not present

## 2018-09-29 DIAGNOSIS — Z86718 Personal history of other venous thrombosis and embolism: Secondary | ICD-10-CM | POA: Insufficient documentation

## 2018-09-29 DIAGNOSIS — Z79899 Other long term (current) drug therapy: Secondary | ICD-10-CM | POA: Insufficient documentation

## 2018-09-29 HISTORY — PX: LAPAROSCOPIC TUBAL LIGATION: SHX1937

## 2018-09-29 LAB — POCT PREGNANCY, URINE: Preg Test, Ur: NEGATIVE

## 2018-09-29 SURGERY — LIGATION, FALLOPIAN TUBE, LAPAROSCOPIC
Anesthesia: General | Site: Abdomen | Laterality: Bilateral

## 2018-09-29 MED ORDER — DEXAMETHASONE SODIUM PHOSPHATE 4 MG/ML IJ SOLN
INTRAMUSCULAR | Status: DC | PRN
  Administered 2018-09-29: 5 mg via INTRAVENOUS

## 2018-09-29 MED ORDER — LACTATED RINGERS IV SOLN: INTRAVENOUS | Status: DC

## 2018-09-29 MED ORDER — MIDAZOLAM HCL 2 MG/2ML IJ SOLN
INTRAMUSCULAR | Status: AC
  Filled 2018-09-29: qty 2

## 2018-09-29 MED ORDER — ONDANSETRON HCL 4 MG/2ML IJ SOLN
INTRAMUSCULAR | Status: AC
  Filled 2018-09-29: qty 2

## 2018-09-29 MED ORDER — FENTANYL CITRATE (PF) 100 MCG/2ML IJ SOLN
INTRAMUSCULAR | Status: AC
  Filled 2018-09-29: qty 2

## 2018-09-29 MED ORDER — ONDANSETRON HCL 4 MG/2ML IJ SOLN
INTRAMUSCULAR | Status: DC | PRN
  Administered 2018-09-29: 4 mg via INTRAVENOUS

## 2018-09-29 MED ORDER — IBUPROFEN 600 MG PO TABS: 600.0000 mg | ORAL_TABLET | Freq: Four times a day (QID) | ORAL | 1 refills | Status: DC | PRN

## 2018-09-29 MED ORDER — OXYCODONE-ACETAMINOPHEN 5-325 MG PO TABS: 1.0000 | ORAL_TABLET | Freq: Four times a day (QID) | ORAL | 0 refills | Status: DC | PRN

## 2018-09-29 MED ORDER — LACTATED RINGERS IV SOLN
INTRAVENOUS | Status: DC
  Administered 2018-09-29 (×2): via INTRAVENOUS

## 2018-09-29 MED ORDER — KETOROLAC TROMETHAMINE 30 MG/ML IJ SOLN
INTRAMUSCULAR | Status: DC | PRN
  Administered 2018-09-29: 30 mg via INTRAVENOUS

## 2018-09-29 MED ORDER — FERRIC SUBSULFATE 259 MG/GM EX SOLN
CUTANEOUS | Status: AC
  Filled 2018-09-29: qty 8

## 2018-09-29 MED ORDER — PROPOFOL 10 MG/ML IV BOLUS
INTRAVENOUS | Status: AC
  Filled 2018-09-29: qty 20

## 2018-09-29 MED ORDER — MEPERIDINE HCL 25 MG/ML IJ SOLN: 6.2500 mg | INTRAMUSCULAR | Status: DC | PRN

## 2018-09-29 MED ORDER — BUPIVACAINE HCL (PF) 0.5 % IJ SOLN
INTRAMUSCULAR | Status: AC
  Filled 2018-09-29: qty 30

## 2018-09-29 MED ORDER — LIDOCAINE 2% (20 MG/ML) 5 ML SYRINGE
INTRAMUSCULAR | Status: AC
  Filled 2018-09-29: qty 5

## 2018-09-29 MED ORDER — LIDOCAINE HCL (CARDIAC) PF 100 MG/5ML IV SOSY
PREFILLED_SYRINGE | INTRAVENOUS | Status: DC | PRN
  Administered 2018-09-29: 50 mg via INTRAVENOUS

## 2018-09-29 MED ORDER — MIDAZOLAM HCL 2 MG/2ML IJ SOLN
1.0000 mg | INTRAMUSCULAR | Status: DC | PRN
  Administered 2018-09-29: 13:00:00 2 mg via INTRAVENOUS

## 2018-09-29 MED ORDER — FENTANYL CITRATE (PF) 100 MCG/2ML IJ SOLN
50.0000 ug | INTRAMUSCULAR | Status: DC | PRN
  Administered 2018-09-29 (×2): 50 ug via INTRAVENOUS

## 2018-09-29 MED ORDER — METOCLOPRAMIDE HCL 5 MG/ML IJ SOLN: 10.0000 mg | Freq: Once | INTRAMUSCULAR | Status: DC | PRN

## 2018-09-29 MED ORDER — FENTANYL CITRATE (PF) 100 MCG/2ML IJ SOLN
25.0000 ug | INTRAMUSCULAR | Status: DC | PRN
  Administered 2018-09-29: 25 ug via INTRAVENOUS
  Administered 2018-09-29: 50 ug via INTRAVENOUS
  Administered 2018-09-29: 25 ug via INTRAVENOUS

## 2018-09-29 MED ORDER — PROPOFOL 10 MG/ML IV BOLUS
INTRAVENOUS | Status: DC | PRN
  Administered 2018-09-29: 200 mg via INTRAVENOUS

## 2018-09-29 MED ORDER — ROCURONIUM BROMIDE 10 MG/ML (PF) SYRINGE
PREFILLED_SYRINGE | INTRAVENOUS | Status: AC
  Filled 2018-09-29: qty 10

## 2018-09-29 MED ORDER — SUGAMMADEX SODIUM 200 MG/2ML IV SOLN
INTRAVENOUS | Status: DC | PRN
  Administered 2018-09-29: 150 mg via INTRAVENOUS

## 2018-09-29 MED ORDER — ROCURONIUM BROMIDE 100 MG/10ML IV SOLN
INTRAVENOUS | Status: DC | PRN
  Administered 2018-09-29: 50 mg via INTRAVENOUS

## 2018-09-29 MED ORDER — SILVER NITRATE-POT NITRATE 75-25 % EX MISC
CUTANEOUS | Status: AC
  Filled 2018-09-29: qty 1

## 2018-09-29 MED ORDER — FERRIC SUBSULFATE 259 MG/GM EX SOLN
CUTANEOUS | Status: DC | PRN
  Administered 2018-09-29: 1

## 2018-09-29 MED ORDER — SCOPOLAMINE 1 MG/3DAYS TD PT72: 1.0000 | MEDICATED_PATCH | Freq: Once | TRANSDERMAL | Status: DC

## 2018-09-29 MED ORDER — BUPIVACAINE HCL (PF) 0.5 % IJ SOLN
INTRAMUSCULAR | Status: DC | PRN
  Administered 2018-09-29: 30 mL

## 2018-09-29 MED ORDER — DEXAMETHASONE SODIUM PHOSPHATE 10 MG/ML IJ SOLN
INTRAMUSCULAR | Status: AC
  Filled 2018-09-29: qty 2

## 2018-09-29 SURGICAL SUPPLY — 32 items
BANDAGE ADH SHEER 1  50/CT (GAUZE/BANDAGES/DRESSINGS) IMPLANT
BRIEF STRETCH FOR OB PAD XXL (UNDERPADS AND DIAPERS) ×3 IMPLANT
CLIP FILSHIE TUBAL LIGA STRL (Clip) ×2 IMPLANT
DRSG OPSITE POSTOP 3X4 (GAUZE/BANDAGES/DRESSINGS) IMPLANT
DURAPREP 26ML APPLICATOR (WOUND CARE) ×3 IMPLANT
GLOVE BIO SURGEON STRL SZ 6.5 (GLOVE) ×2 IMPLANT
GLOVE BIO SURGEONS STRL SZ 6.5 (GLOVE) ×1
GLOVE BIOGEL PI IND STRL 7.0 (GLOVE) ×2 IMPLANT
GLOVE BIOGEL PI INDICATOR 7.0 (GLOVE) ×4
GOWN STRL REUS W/ TWL XL LVL3 (GOWN DISPOSABLE) ×1 IMPLANT
GOWN STRL REUS W/TWL LRG LVL3 (GOWN DISPOSABLE) ×3 IMPLANT
GOWN STRL REUS W/TWL XL LVL3 (GOWN DISPOSABLE) ×2
NEEDLE INSUFFLATION 120MM (ENDOMECHANICALS) ×3 IMPLANT
NS IRRIG 1000ML POUR BTL (IV SOLUTION) ×3 IMPLANT
PACK LAPAROSCOPY BASIN (CUSTOM PROCEDURE TRAY) ×3 IMPLANT
PACK TRENDGUARD 450 HYBRID PRO (MISCELLANEOUS) IMPLANT
PACK TRENDGUARD 600 HYBRD PROC (MISCELLANEOUS) IMPLANT
PAD OB MATERNITY 4.3X12.25 (PERSONAL CARE ITEMS) ×3 IMPLANT
PAD PREP 24X48 CUFFED NSTRL (MISCELLANEOUS) ×3 IMPLANT
SET TUBE SMOKE EVAC HIGH FLOW (TUBING) ×3 IMPLANT
SHEARS HARMONIC ACE PLUS 36CM (ENDOMECHANICALS) IMPLANT
SLEEVE SCD COMPRESS KNEE MED (MISCELLANEOUS) ×3 IMPLANT
SLEEVE XCEL OPT CAN 5 100 (ENDOMECHANICALS) IMPLANT
SUT VICRYL 0 CT-2 (SUTURE) ×2 IMPLANT
SUT VICRYL 0 UR6 27IN ABS (SUTURE) ×3 IMPLANT
SUT VICRYL 4-0 PS2 18IN ABS (SUTURE) ×3 IMPLANT
SYSTEM CARTER THOMASON II (TROCAR) IMPLANT
TOWEL GREEN STERILE FF (TOWEL DISPOSABLE) ×6 IMPLANT
TRENDGUARD 450 HYBRID PRO PACK (MISCELLANEOUS) ×3
TRENDGUARD 600 HYBRID PROC PK (MISCELLANEOUS)
TROCAR OPTI TIP 5M 100M (ENDOMECHANICALS) IMPLANT
TROCAR XCEL DIL TIP R 11M (ENDOMECHANICALS) ×3 IMPLANT

## 2018-09-29 NOTE — Anesthesia Postprocedure Evaluation (Signed)
Anesthesia Post Note  Patient: Erin Jacobson  Procedure(s) Performed: LAPAROSCOPIC TUBAL LIGATION WITH FILSHIE CLIPS (Bilateral )     Patient location during evaluation: PACU Anesthesia Type: General Level of consciousness: awake and alert Pain management: pain level controlled Vital Signs Assessment: post-procedure vital signs reviewed and stable Respiratory status: spontaneous breathing, nonlabored ventilation, respiratory function stable and patient connected to nasal cannula oxygen Cardiovascular status: blood pressure returned to baseline and stable Postop Assessment: no apparent nausea or vomiting Anesthetic complications: no    Last Vitals:  Vitals:   09/29/18 1515 09/29/18 1530  BP: 122/88 123/86  Pulse: 88 74  Resp: 18 12  Temp:    SpO2: 100% 100%    Last Pain:  Vitals:   09/29/18 1513  TempSrc:   PainSc: 9                  Montez Hageman

## 2018-09-29 NOTE — Transfer of Care (Signed)
Immediate Anesthesia Transfer of Care Note  Patient: Erin Jacobson  Procedure(s) Performed: LAPAROSCOPIC TUBAL LIGATION WITH FILSHIE CLIPS (Bilateral )  Patient Location: PACU  Anesthesia Type:General  Level of Consciousness: awake, alert  and oriented  Airway & Oxygen Therapy: Patient Spontanous Breathing and Patient connected to nasal cannula oxygen  Post-op Assessment: Report given to RN and Post -op Vital signs reviewed and stable  Post vital signs: Reviewed and stable  Last Vitals:  Vitals Value Taken Time  BP 119/79 09/29/18 1404  Temp    Pulse 95 09/29/18 1406  Resp 25 09/29/18 1406  SpO2 100 % 09/29/18 1406  Vitals shown include unvalidated device data.  Last Pain:  Vitals:   09/29/18 1213  TempSrc: Oral  PainSc: 0-No pain      Patients Stated Pain Goal: 0 (82/80/03 4917)  Complications: No apparent anesthesia complications

## 2018-09-29 NOTE — Anesthesia Procedure Notes (Signed)
Procedure Name: Intubation Date/Time: 09/29/2018 1:03 PM Performed by: Bufford Spikes, CRNA Pre-anesthesia Checklist: Patient identified, Emergency Drugs available, Suction available and Patient being monitored Patient Re-evaluated:Patient Re-evaluated prior to induction Oxygen Delivery Method: Circle system utilized Preoxygenation: Pre-oxygenation with 100% oxygen Induction Type: IV induction Ventilation: Mask ventilation without difficulty Laryngoscope Size: Miller and 2 Grade View: Grade II Tube type: Oral Tube size: 7.0 mm Number of attempts: 1 Airway Equipment and Method: Stylet and Oral airway Placement Confirmation: ETT inserted through vocal cords under direct vision,  positive ETCO2 and breath sounds checked- equal and bilateral Secured at: 19 cm Tube secured with: Tape Dental Injury: Teeth and Oropharynx as per pre-operative assessment

## 2018-09-29 NOTE — Anesthesia Preprocedure Evaluation (Signed)
Anesthesia Evaluation  Patient identified by MRN, date of birth, ID band Patient awake    Reviewed: Allergy & Precautions, NPO status , Patient's Chart, lab work & pertinent test results  History of Anesthesia Complications (+) PONV  Airway Mallampati: II  TM Distance: >3 FB Neck ROM: Full    Dental no notable dental hx.    Pulmonary neg pulmonary ROS, former smoker,    Pulmonary exam normal breath sounds clear to auscultation       Cardiovascular + DVT  Normal cardiovascular exam Rhythm:Regular Rate:Normal     Neuro/Psych negative neurological ROS  negative psych ROS   GI/Hepatic Neg liver ROS, GERD  Controlled,  Endo/Other  negative endocrine ROS  Renal/GU negative Renal ROS  negative genitourinary   Musculoskeletal negative musculoskeletal ROS (+)   Abdominal   Peds negative pediatric ROS (+)  Hematology negative hematology ROS (+)   Anesthesia Other Findings   Reproductive/Obstetrics negative OB ROS                             Anesthesia Physical Anesthesia Plan  ASA: II  Anesthesia Plan: General   Post-op Pain Management:    Induction: Intravenous  PONV Risk Score and Plan: 4 or greater and Ondansetron, Dexamethasone, Midazolam and Treatment may vary due to age or medical condition  Airway Management Planned: Oral ETT  Additional Equipment:   Intra-op Plan:   Post-operative Plan: Extubation in OR  Informed Consent: I have reviewed the patients History and Physical, chart, labs and discussed the procedure including the risks, benefits and alternatives for the proposed anesthesia with the patient or authorized representative who has indicated his/her understanding and acceptance.     Dental advisory given  Plan Discussed with: CRNA  Anesthesia Plan Comments:         Anesthesia Quick Evaluation

## 2018-09-29 NOTE — H&P (Signed)
Erin Jacobson is an 28 y.o. married P3 (28 yo, 28 yo, and 4 month old sons) here for laparoscopic application of Filsche clips. She is certain that she does not want more kids. She declines alternative forms of contraception.  Patient's last menstrual period was 09/26/2018.    Past Medical History:  Diagnosis Date  . Complication of anesthesia   . Depression    on meds, doing ok  . DVT of upper extremity (deep vein thrombosis) ([place])   . GERD (gastroesophageal reflux disease)    no current meds for reflux  . Infection    UTI  . Kidney stone    stent and foley placed to pass kidney stones 05/05/2014  . PONV (postoperative nausea and vomiting)   . Trichomoniasis     Past Surgical History:  Procedure Laterality Date  . APPENDECTOMY  04/01/2004   ex. lap.  . CESAREAN SECTION N/A 06/11/2015   Procedure: CESAREAN SECTION;  Surgeon: Jerelyn Charles, MD;  Location: [facility];  Service: Obstetrics;  Laterality: N/A;  . CYSTOSCOPY WITH RETROGRADE PYELOGRAM, URETEROSCOPY AND STENT PLACEMENT Left 05/05/2014   Procedure: LEFT  URETEROSCOPY,  RETROGRADE PYELOGRAM,  AND STENT PLACEMENT, STONE REMOVAL;  Surgeon: Ardis Hughs, MD;  Location: [facility];  Service: Urology;  Laterality: Left;  . HOLMIUM LASER APPLICATION Left 9/51/8841   Procedure: HOLMIUM LASER APPLICATION;  Surgeon: Ardis Hughs, MD;  Location: [facility];  Service: Urology;  Laterality: Left;  . KNEE SURGERY      Family History  Problem Relation Age of Onset  . Anesthesia problems Mother        post-op N/V, hard to wake up  . Arthritis Mother   . Cancer Maternal Grandfather     Social History:  reports that she quit smoking about 4 years ago. Her smoking use included cigars. She has never used smokeless tobacco. She reports previous alcohol use. She reports that she does not use drugs.  Allergies: No Known Allergies  Medications Prior to Admission  Medication Sig Dispense Refill Last Dose  . Prenatal Vit-Fe Fumarate-FA (PRENATAL  VITAMINS) 28-0.8 MG TABS Take 1 tablet by mouth daily.  1 Past Week at Unknown time  . sertraline (ZOLOFT) 25 MG tablet Take 3 tablets (75 mg total) by mouth daily. 90 tablet 6 09/28/2018 at Unknown time  . albuterol (PROVENTIL HFA;VENTOLIN HFA) 108 (90 Base) MCG/ACT inhaler Inhale 2 puffs into the lungs every 6 (six) hours as needed for wheezing or shortness of breath. 1 Inhaler 2 More than a month at Unknown time    ROS  Blood pressure 119/71, pulse 84, temperature 97.8 F (36.6 C), temperature source Oral, resp. rate 20, height 5\' 1"  (1.549 m), weight 67.5 kg, last menstrual period 09/26/2018, SpO2 100 %, currently breastfeeding. Physical Exam Breathing, conversing, and ambulating normally Well nourished, well hydrated Black female, no apparent distress Heart- rrr Lungs- CTAB Abd- benign, scar from appendectomy   Results for orders placed or performed during the hospital encounter of 09/29/18 (from the past 24 hour(s))  Pregnancy, urine POC     Status: None   Collection Time: 09/29/18 11:59 AM  Result Value Ref Range   Preg Test, Ur NEGATIVE NEGATIVE    No results found.  Assessment/Plan: Unwanted fertility- plan for laparoscopy and application of Filsche clips. She is aware of the failure rate of the procedure.  She understands the risks of surgery, including, but not to infection, bleeding, DVTs, damage to bowel, bladder, ureters. She wishes to proceed.  Allie BossierMyra C Azavier Creson 09/29/2018, 12:51 PM

## 2018-09-29 NOTE — Discharge Instructions (Signed)
No ibuprofen before 7pm tonight!   Laparoscopic Tubal Ligation, Care After This sheet gives you information about how to care for yourself after your procedure. Your health care provider may also give you more specific instructions. If you have problems or questions, contact your health care provider. What can I expect after the procedure? After the procedure, it is common to have:  A sore throat.  Discomfort in your shoulder.  Mild discomfort or cramping in your abdomen.  Gas pains.  Pain or soreness in the area where the surgical incision was made.  A bloated feeling.  Tiredness.  Nausea.  Vomiting. Follow these instructions at home: Medicines  Take over-the-counter and prescription medicines only as told by your health care provider.  Do not take aspirin because it can cause bleeding.  Ask your health care provider if the medicine prescribed to you: ? Requires you to avoid driving or using heavy machinery. ? Can cause constipation. You may need to take actions to prevent or treat constipation, such as:  Drink enough fluid to keep your urine pale yellow.  Take over-the-counter or prescription medicines.  Eat foods that are high in fiber, such as beans, whole grains, and fresh fruits and vegetables.  Limit foods that are high in fat and processed sugars, such as fried or sweet foods. Incision care   Follow instructions from your health care provider about how to take care of your incision. Make sure you: ? Wash your hands with soap and water before and after you change your bandage (dressing). If soap and water are not available, use hand sanitizer. ? Change your dressing as told by your health care provider. ? Leave stitches (sutures), skin glue, or adhesive strips in place. These skin closures may need to stay in place for 2 weeks or longer. If adhesive strip edges start to loosen and curl up, you may trim the loose edges. Do not remove adhesive strips completely  unless your health care provider tells you to do that.  Check your incision area every day for signs of infection. Check for: ? Redness, swelling, or pain. ? Fluid or blood. ? Warmth. ? Pus or a bad smell. Activity  Rest as told by your health care provider.  Avoid sitting for a long time without moving. Get up to take short walks every 1-2 hours. This is important to improve blood flow and breathing. Ask for help if you feel weak or unsteady.  Return to your normal activities as told by your health care provider. Ask your health care provider what activities are safe for you. General instructions  Do not take baths, swim, or use a hot tub until your health care provider approves. Ask your health care provider if you may take showers. You may only be allowed to take sponge baths.  Have someone help you with your daily household tasks for the first few days.  Keep all follow-up visits as told by your health care provider. This is important. Contact a health care provider if:  You have redness, swelling, or pain around your incision.  Your incision feels warm to the touch.  You have pus or a bad smell coming from your incision.  The edges of your incision break open after the sutures have been removed.  Your pain does not improve after 2-3 days.  You have a rash.  You repeatedly become dizzy or light-headed.  Your pain medicine is not helping. Get help right away if you:  Have a fever.  Faint.  Have increasing pain in your abdomen.  Have severe pain in one or both of your shoulders.  Have fluid or blood coming from your sutures or from your vagina.  Have shortness of breath or difficulty breathing.  Have chest pain or leg pain.  Have ongoing nausea, vomiting, or diarrhea. Summary  After the procedure, it is common to have mild discomfort or cramping in your abdomen.  Take over-the-counter and prescription medicines only as told by your health care  provider.  Watch for symptoms that should prompt you to call your health care provider.  Keep all follow-up visits as told by your health care provider. This is important. This information is not intended to replace advice given to you by your health care provider. Make sure you discuss any questions you have with your health care provider. Document Released: 09/27/2004 Document Revised: 02/02/2018 Document Reviewed: 02/02/2018 Elsevier Patient Education  2020 [employer]       Post Anesthesia Home Care Instructions  Activity: Get plenty of rest for the remainder of the day. A responsible individual must stay with you for 24 hours following the procedure.  For the next 24 hours, DO NOT: -Drive a car -Advertising copywriterperate machinery -Drink alcoholic beverages -Take any medication unless instructed by your physician -Make any legal decisions or sign important papers.  Meals: Start with liquid foods such as gelatin or soup. Progress to regular foods as tolerated. Avoid greasy, spicy, heavy foods. If nausea and/or vomiting occur, drink only clear liquids until the nausea and/or vomiting subsides. Call your physician if vomiting continues.  Special Instructions/Symptoms: Your throat may feel dry or sore from the anesthesia or the breathing tube placed in your throat during surgery. If this causes discomfort, gargle with warm salt water. The discomfort should disappear within 24 hours.  If you had a scopolamine patch placed behind your ear for the management of post- operative nausea and/or vomiting:  1. The medication in the patch is effective for 72 hours, after which it should be removed.  Wrap patch in a tissue and discard in the trash. Wash hands thoroughly with soap and water. 2. You may remove the patch earlier than 72 hours if you experience unpleasant side effects which may include dry mouth, dizziness or visual disturbances. 3. Avoid touching the patch. Wash your hands with soap and water  after contact with the patch.

## 2018-09-29 NOTE — Op Note (Signed)
09/29/2018  2:05 PM  PATIENT:  Erin Jacobson  28 y.o. female  PRE-OPERATIVE DIAGNOSIS:  Undesired Fertiltiy  POST-OPERATIVE DIAGNOSIS:  UNDESIRED FERTILITY  PROCEDURE:  Procedure(s): LAPAROSCOPIC TUBAL LIGATION WITH FILSHIE CLIPS (Bilateral)  SURGEON:  Surgeon(s) and Role:    * Sohaib Vereen C, MD - Primary  ANESTHESIA:   local and general  EBL:  5 mL   BLOOD ADMINISTERED:none  DRAINS: none   LOCAL MEDICATIONS USED:  MARCAINE     SPECIMEN:  No Specimen  DISPOSITION OF SPECIMEN:  N/A  COUNTS:  YES  TOURNIQUET:  * No tourniquets in log *  DICTATION: .Dragon Dictation  PLAN OF CARE: Discharge to home after PACU  PATIENT DISPOSITION:  PACU - hemodynamically stable.   Delay start of Pharmacological VTE agent (>24hrs) due to surgical blood loss or risk of bleeding: not applicable   The risks, benefits, alternatives of surgery were explained understood, accepted. All questions were answered. She understands the failure rate of 1/400. She declines alternative forms of birth control. Her urine pregnancy test was negative. She was taken to the operating room and general anesthesia was applied without complication. She was placed in dorsal lithotomy position. Her abdomen and vagina were prepped and draped in the usual sterile fashion. A time out procedure was done. A bimanual exam revealed a normal, size and shape mobile uterus with nonenlarged adnexa. A Hulka manipulator was placed on the cervix. Gloves were changed and attention was turned to the abdomen. Approximately 10 mL of 0.5% Marcaine was used to infiltrate the subcutaneous tissue in the left upper quadrant after an OG tube was placed.  A vertical 1 cm incision was made. She has a h/o appendectomy so I chose the LUQ entry point in order to try to avoid any potential post surgical adhesions.  A Veres needle was placed in the pelvis. Low-flow CO2 was used to insufflate the abdomen to approximately 3 L. After good pneumoperitoneum was  established, a 11 mm trocar was placed. Laparoscopy confirmed correct placement. She was placed in the Trendelenburg position. Patient abdominal pressure was always less than 15. Her uterus ovaries and tubes appeared normal. There was no evidence of endometriosis. The oviducts were visually traced to their fimbriated end on each side. In the isthmic region of each oviduct, a Filshie clip was placed across the entire oviduct. There was no bleeding. The CO2 was allowed to escape from her abdomen. The fascia was closed with a figure of 0 Vicryl suture. No defects were palpable. The subcuticular closure was done with 4-0 Vicryl suture. The Hulka manipulator was removed. I did see an unusual amount of bleeding from the tenaculem site. I applied silver nitrate and the bleeding continued. I applied Monsel's solution and no change in the bleeding. I then put a figure of eight 2-0 vicryl suture in the site and hemostasis was achieved.  She was extubated and taken to recovery in stable condition. She tolerated the procedure well. The instrument, sponge, and needle counts were correct.

## 2018-09-30 ENCOUNTER — Encounter (HOSPITAL_BASED_OUTPATIENT_CLINIC_OR_DEPARTMENT_OTHER): Payer: Self-pay | Admitting: Obstetrics & Gynecology

## 2018-12-22 ENCOUNTER — Other Ambulatory Visit: Payer: Self-pay

## 2018-12-22 DIAGNOSIS — Z20822 Contact with and (suspected) exposure to covid-19: Secondary | ICD-10-CM

## 2018-12-23 LAB — NOVEL CORONAVIRUS, NAA: SARS-CoV-2, NAA: NOT DETECTED

## 2018-12-31 ENCOUNTER — Encounter (HOSPITAL_COMMUNITY): Payer: Self-pay | Admitting: Emergency Medicine

## 2018-12-31 ENCOUNTER — Emergency Department (HOSPITAL_COMMUNITY)
Admission: EM | Admit: 2018-12-31 | Discharge: 2018-12-31 | Disposition: A | Discharge status: Home / Self Care | Payer: Medicaid Other | Attending: Emergency Medicine | Admitting: Emergency Medicine

## 2018-12-31 ENCOUNTER — Other Ambulatory Visit: Payer: Self-pay

## 2018-12-31 DIAGNOSIS — Z5321 Procedure and treatment not carried out due to patient leaving prior to being seen by health care provider: Secondary | ICD-10-CM | POA: Insufficient documentation

## 2018-12-31 DIAGNOSIS — R5383 Other fatigue: Secondary | ICD-10-CM | POA: Insufficient documentation

## 2018-12-31 DIAGNOSIS — R42 Dizziness and giddiness: Secondary | ICD-10-CM | POA: Diagnosis not present

## 2018-12-31 DIAGNOSIS — R109 Unspecified abdominal pain: Secondary | ICD-10-CM | POA: Diagnosis present

## 2018-12-31 LAB — CBC WITH DIFFERENTIAL/PLATELET
Abs Immature Granulocytes: 0.02 10*3/uL (ref 0.00–0.07)
Basophils Absolute: 0.1 10*3/uL (ref 0.0–0.1)
Basophils Relative: 1 %
Eosinophils Absolute: 0.3 10*3/uL (ref 0.0–0.5)
Eosinophils Relative: 3 %
HCT: 42 % (ref 36.0–46.0)
Hemoglobin: 13.8 g/dL (ref 12.0–15.0)
Immature Granulocytes: 0 %
Lymphocytes Relative: 33 %
Lymphs Abs: 3.9 10*3/uL (ref 0.7–4.0)
MCH: 30.7 pg (ref 26.0–34.0)
MCHC: 32.9 g/dL (ref 30.0–36.0)
MCV: 93.5 fL (ref 80.0–100.0)
Monocytes Absolute: 0.9 10*3/uL (ref 0.1–1.0)
Monocytes Relative: 8 %
Neutro Abs: 6.5 10*3/uL (ref 1.7–7.7)
Neutrophils Relative %: 55 %
Platelets: 324 10*3/uL (ref 150–400)
RBC: 4.49 MIL/uL (ref 3.87–5.11)
RDW: 12.3 % (ref 11.5–15.5)
WBC: 11.6 10*3/uL — ABNORMAL HIGH (ref 4.0–10.5)
nRBC: 0 % (ref 0.0–0.2)

## 2018-12-31 LAB — I-STAT BETA HCG BLOOD, ED (MC, WL, AP ONLY): I-stat hCG, quantitative: <5 m[IU]/mL (ref <5)

## 2018-12-31 LAB — COMPREHENSIVE METABOLIC PANEL
ALT: 24 U/L (ref 0–44)
AST: 24 U/L (ref 15–41)
Albumin: 4.2 g/dL (ref 3.5–5.0)
Alkaline Phosphatase: 100 U/L (ref 38–126)
Anion gap: 12 (ref 5–15)
BUN: 13 mg/dL (ref 6–20)
CO2: 24 mmol/L (ref 22–32)
Calcium: 9.4 mg/dL (ref 8.9–10.3)
Chloride: 104 mmol/L (ref 98–111)
Creatinine, Ser: 0.64 mg/dL (ref 0.44–1.00)
GFR calc Af Amer: >60 mL/min (ref >60)
GFR calc non Af Amer: >60 mL/min (ref >60)
Glucose, Bld: 89 mg/dL (ref 70–99)
Potassium: 4.2 mmol/L (ref 3.5–5.1)
Sodium: 140 mmol/L (ref 135–145)
Total Bilirubin: 0.4 mg/dL (ref 0.3–1.2)
Total Protein: 7.6 g/dL (ref 6.5–8.1)

## 2018-12-31 NOTE — ED Triage Notes (Signed)
Pt to ED c/o continued soreness at surgical site for tubulization.  Pt also c/o feeling tired all the time and lightheaded.  Also c/o dark urine and dark stools.for approx 1 week

## 2018-12-31 NOTE — ED Notes (Signed)
Pt did not respond when called for vitals check 

## 2019-03-28 ENCOUNTER — Telehealth: Payer: Self-pay | Admitting: *Deleted

## 2019-03-28 DIAGNOSIS — N898 Other specified noninflammatory disorders of vagina: Secondary | ICD-10-CM

## 2019-03-28 DIAGNOSIS — B9689 Other specified bacterial agents as the cause of diseases classified elsewhere: Secondary | ICD-10-CM

## 2019-03-28 DIAGNOSIS — B3731 Acute candidiasis of vulva and vagina: Secondary | ICD-10-CM

## 2019-03-28 DIAGNOSIS — B373 Candidiasis of vulva and vagina: Secondary | ICD-10-CM

## 2019-03-28 MED ORDER — METRONIDAZOLE 0.75 % VA GEL: 1.0000 | Freq: Every day | VAGINAL | 0 refills | Status: AC

## 2019-03-28 MED ORDER — FLUCONAZOLE 150 MG PO TABS: 150.0000 mg | ORAL_TABLET | Freq: Once | ORAL | 0 refills | Status: AC

## 2019-03-28 MED ORDER — METRONIDAZOLE 500 MG PO TABS: 500.0000 mg | ORAL_TABLET | Freq: Two times a day (BID) | ORAL | 0 refills | Status: DC

## 2019-03-28 NOTE — Telephone Encounter (Signed)
Erin Jacobson left a voicemail this am that she had just gotten off the phone speaking with a nurse about getting pills for BV and yeast . States she is breastfeeding and her PCP usually sends in cream- wants to know if we can do the cream and asks for a call back.  I sent in RX for metrogel per protocol. I called [facility] and was unable to leave a message. Will send her MyChart message to notify her. I have called [facility] three times to cancel flagyl and have been unable to get thru.  Andrewjames Weirauch.,RN

## 2019-03-28 NOTE — Telephone Encounter (Signed)
I called Erin Jacobson to discuss her MyChart requests for refill of BV and yeast meds. Verfied in chart has been treated before for BV  And has been over 6 months. She confirms she is having vaginal odor like before when she had BV. I informed her per protocol I can send in Flagyl one pill  BID x 7 days  as before . She also c/o thick white vaginal discharge like she had with yeast before. I informed her I will send in Diflucan x1 as before and to take after she finishes the Flagyl. She voices understanding. Erin Vasko,RN

## 2019-07-31 ENCOUNTER — Other Ambulatory Visit: Payer: Self-pay

## 2019-07-31 ENCOUNTER — Ambulatory Visit
Admission: EM | Admit: 2019-07-31 | Discharge: 2019-07-31 | Disposition: A | Discharge status: Home / Self Care | Payer: Medicaid Other | Attending: Emergency Medicine | Admitting: Emergency Medicine

## 2019-07-31 DIAGNOSIS — H60311 Diffuse otitis externa, right ear: Secondary | ICD-10-CM

## 2019-07-31 MED ORDER — NEOMYCIN-POLYMYXIN-HC 3.5-10000-1 OT SOLN: 3.0000 [drp] | Freq: Four times a day (QID) | OTIC | 0 refills | Status: AC

## 2019-07-31 NOTE — ED Triage Notes (Signed)
Pt c/o rt ear pain x1wk, now having drainage and pain to jaw.

## 2019-07-31 NOTE — Discharge Instructions (Signed)
Use eardrops as prescribed for the next week. Return for worsening ear pain, swelling, discharge, bleeding, decreased hearing, development of jaw pain/swelling, fever.  Do NOT use Q-tips as these can cause your ear wax to get stuck, the tips may break off and become a foreign body requiring additional medical care, or puncture your eardrum.  Helpful prevention tip: Use a solution of equal parts isopropyl (rubbing) alcohol and white vinegar (acetic acid) in both ears after swimming. 

## 2019-07-31 NOTE — ED Provider Notes (Signed)
[name]    CSN: [id number] Arrival date & time: 07/31/19  1246      History   Chief Complaint Chief Complaint  Patient presents with  . Otalgia    HPI Erin Jacobson is a 29 y.o. female presenting for right ear pain x1 week.  Patient states that she noticed drainage in the last day as well as jaw pain.  Denies fever, sinus congestion or pressure, sore throat, difficulty breathing or swallowing.  No change in hearing, foreign body exposure sensation.  Has not tried nothing for this.    Past Medical History:  Diagnosis Date  . Complication of anesthesia   . Depression    on meds, doing ok  . DVT of upper extremity (deep vein thrombosis) (HCC)   . GERD (gastroesophageal reflux disease)    no current meds for reflux  . Infection    UTI  . Kidney stone    stent and foley placed to pass kidney stones 05/05/2014  . PONV (postoperative nausea and vomiting)   . Trichomoniasis     Patient Active Problem List   Diagnosis Date Noted  . Normal labor 06/14/2018  . Preterm labor 06/14/2018  . Group B Streptococcus carrier, antepartum 06/14/2018  . Pruritus of pregnancy, antepartum, third trimester 05/31/2018  . Pneumonia affecting pregnancy in third trimester 05/31/2018  . Anemia in pregnancy 04/12/2018  . History of poor fetal growth 04/09/2018  . Late prenatal care 04/09/2018  . History of sexually transmitted disease 04/09/2018  . Non-compliant pregnant patient 04/09/2018  . Anxiety and depression 04/09/2018  . Supervision of high risk pregnancy, antepartum 03/12/2018  . History of cesarean delivery 03/12/2018  . History of deep vein thrombosis (DVT) during pregnancy 03/12/2018    Past Surgical History:  Procedure Laterality Date  . APPENDECTOMY  04/01/2004   ex. lap.  . CESAREAN SECTION N/A 06/11/2015   Procedure: CESAREAN SECTION;  Surgeon: Marlow Baars, MD;  Location: [facility];  Service: Obstetrics;  Laterality: N/A;  . CYSTOSCOPY WITH RETROGRADE PYELOGRAM,  URETEROSCOPY AND STENT PLACEMENT Left 05/05/2014   Procedure: LEFT  URETEROSCOPY,  RETROGRADE PYELOGRAM,  AND STENT PLACEMENT, STONE REMOVAL;  Surgeon: Crist Fat, MD;  Location: [facility];  Service: Urology;  Laterality: Left;  . HOLMIUM LASER APPLICATION Left 05/05/2014   Procedure: HOLMIUM LASER APPLICATION;  Surgeon: Crist Fat, MD;  Location: [facility];  Service: Urology;  Laterality: Left;  . KNEE SURGERY    . LAPAROSCOPIC TUBAL LIGATION Bilateral 09/29/2018   Procedure: LAPAROSCOPIC TUBAL LIGATION WITH FILSHIE CLIPS;  Surgeon: Allie Bossier, MD;  Location: [facility];  Service: Gynecology;  Laterality: Bilateral;    OB History    Gravida  3   Para  3   Term  2   Preterm  1   AB      Living  3     SAB      TAB      Ectopic      Multiple  0   Live Births  3            Home Medications    Prior to Admission medications   Medication Sig Start Date End Date Taking? Authorizing Provider  albuterol (PROVENTIL HFA;VENTOLIN HFA) 108 (90 Base) MCG/ACT inhaler Inhale 2 puffs into the lungs every 6 (six) hours as needed for wheezing or shortness of breath. 05/28/18   Katrinka Blazing, [place], CNM  ibuprofen (ADVIL) 600 MG tablet Take 1 tablet (600 mg  total) by mouth every 6 (six) hours as needed. 09/29/18   Emily Filbert, MD  neomycin-polymyxin-hydrocortisone (CORTISPORIN) OTIC solution Place 3 drops into the right ear 4 (four) times daily for 7 days. 07/31/19 08/07/19  Hall-Potvin, [place], PA-C    Family History Family History  Problem Relation Age of Onset  . Anesthesia problems Mother        post-op N/V, hard to wake up  . Arthritis Mother   . Cancer Maternal Grandfather     Social History Social History   Tobacco Use  . Smoking status: Former Smoker    Types: Cigars    Quit date: 09/24/2014    Years since quitting: 4.8  . Smokeless tobacco: Never Used  Substance Use Topics  . Alcohol use: Not Currently    Comment: occasional/ not since pregnancy    . Drug use: No     Allergies   Patient has no known allergies.   Review of Systems As per HPI   Physical Exam Triage Vital Signs ED Triage Vitals [07/31/19 1254]  Enc Vitals Group     BP 119/79     Pulse Rate 88     Resp 16     Temp 98.4 F (36.9 C)     Temp Source Oral     SpO2 97 %     Weight      Height      Head Circumference      Peak Flow      Pain Score 8     Pain Loc      Pain Edu?      Excl. in [place]?    No data found.  Updated Vital Signs BP 119/79 (BP Location: Left Arm)   Pulse 88   Temp 98.4 F (36.9 C) (Oral)   Resp 16   LMP 07/20/2019   SpO2 97%   Breastfeeding No   Visual Acuity Right Eye Distance:   Left Eye Distance:   Bilateral Distance:    Right Eye Near:   Left Eye Near:    Bilateral Near:     Physical Exam Constitutional:      General: She is not in acute distress. HENT:     Head: Normocephalic and atraumatic.     Jaw: There is normal jaw occlusion. No tenderness or pain on movement.     Right Ear: Hearing and tympanic membrane normal. No tenderness. No mastoid tenderness.     Left Ear: Hearing, tympanic membrane, ear canal and external ear normal. No tenderness. No mastoid tenderness.     Ears:     Comments: Right ear: Positive tragal tenderness.  EAC swelling and injection with mild discharge.  Tender with otoscope    Nose: No nasal deformity, septal deviation or nasal tenderness.     Right Turbinates: Not swollen or pale.     Left Turbinates: Not swollen or pale.     Right Sinus: No maxillary sinus tenderness or frontal sinus tenderness.     Left Sinus: No maxillary sinus tenderness or frontal sinus tenderness.     Mouth/Throat:     Lips: Pink. No lesions.     Mouth: Mucous membranes are moist. No injury.     Pharynx: Oropharynx is clear. Uvula midline. No posterior oropharyngeal erythema or uvula swelling.     Comments: no tonsillar exudate or hypertrophy Cardiovascular:     Rate and Rhythm: Normal rate.  Pulmonary:      Effort: Pulmonary effort is normal.  Musculoskeletal:  Cervical back: Normal range of motion and neck supple. No muscular tenderness.  Lymphadenopathy:     Cervical: No cervical adenopathy.  Neurological:     Mental Status: She is alert and oriented to person, place, and time.      UC Treatments / Results  Labs (all labs ordered are listed, but only abnormal results are displayed) Labs Reviewed - No data to display  EKG   Radiology No results found.  Procedures Procedures (including critical care time)  Medications Ordered in UC Medications - No data to display  Initial Impression / Assessment and Plan / UC Course  I have reviewed the triage vital signs and the nursing notes.  Pertinent labs & imaging results that were available during my care of the patient were reviewed by me and considered in my medical decision making (see chart for details).     Patient febrile, nontoxic in office today.  H&P consistent with acute otitis externa.  Will start Cortisporin drops and have patient follow-up with PCP in 1 week.  Return precautions discussed, patient verbalized understanding and is agreeable to plan. Final Clinical Impressions(s) / UC Diagnoses   Final diagnoses:  Acute diffuse otitis externa of right ear     Discharge Instructions     Use eardrops as prescribed for the next week. Return for worsening ear pain, swelling, discharge, bleeding, decreased hearing, development of jaw pain/swelling, fever.  Do NOT use Q-tips as these can cause your ear wax to get stuck, the tips may break off and become a foreign body requiring additional medical care, or puncture your eardrum.  Helpful prevention tip: Use a solution of equal parts isopropyl (rubbing) alcohol and white vinegar (acetic acid) in both ears after swimming.    ED Prescriptions    Medication Sig Dispense Auth. Provider   neomycin-polymyxin-hydrocortisone (CORTISPORIN) OTIC solution Place 3 drops  into the right ear 4 (four) times daily for 7 days. 10 mL Hall-Potvin, [place], PA-C     PDMP not reviewed this encounter.   Hall-Potvin, [place], [place] 07/31/19 1334

## 2019-08-07 ENCOUNTER — Other Ambulatory Visit: Payer: Self-pay

## 2019-08-07 ENCOUNTER — Ambulatory Visit
Admission: EM | Admit: 2019-08-07 | Discharge: 2019-08-07 | Disposition: A | Discharge status: Home / Self Care | Payer: Medicaid Other | Attending: Physician Assistant | Admitting: Physician Assistant

## 2019-08-07 DIAGNOSIS — N898 Other specified noninflammatory disorders of vagina: Secondary | ICD-10-CM | POA: Diagnosis present

## 2019-08-07 DIAGNOSIS — H938X1 Other specified disorders of right ear: Secondary | ICD-10-CM | POA: Diagnosis not present

## 2019-08-07 DIAGNOSIS — L739 Follicular disorder, unspecified: Secondary | ICD-10-CM | POA: Diagnosis present

## 2019-08-07 LAB — POCT URINALYSIS DIP (MANUAL ENTRY)
Bilirubin, UA: NEGATIVE
Glucose, UA: NEGATIVE mg/dL
Leukocytes, UA: NEGATIVE
Nitrite, UA: NEGATIVE
Spec Grav, UA: 1.025 (ref 1.010–1.025)
Urobilinogen, UA: 0.2 E.U./dL
pH, UA: 5.5 (ref 5.0–8.0)

## 2019-08-07 MED ORDER — MUPIROCIN 2 % EX OINT: 1.0000 "application " | TOPICAL_OINTMENT | Freq: Two times a day (BID) | CUTANEOUS | 0 refills | Status: DC

## 2019-08-07 MED ORDER — FLUTICASONE PROPIONATE 50 MCG/ACT NA SUSP: 2.0000 | Freq: Every day | NASAL | 0 refills | Status: DC

## 2019-08-07 MED ORDER — METRONIDAZOLE 0.75 % VA GEL: 1.0000 | Freq: Every day | VAGINAL | 0 refills | Status: AC

## 2019-08-07 MED ORDER — AZELASTINE HCL 0.1 % NA SOLN: 2.0000 | Freq: Two times a day (BID) | NASAL | 0 refills | Status: DC

## 2019-08-07 MED ORDER — FLUCONAZOLE 150 MG PO TABS: 150.0000 mg | ORAL_TABLET | Freq: Every day | ORAL | 0 refills | Status: DC

## 2019-08-07 NOTE — Discharge Instructions (Addendum)
Right ear fullness No signs of infection. Start flonase and azelastine for eustachian tube dysfunction. Follow up with PCP if symptoms still not improving.  Folliculitis (vaginal lesion)/vaginal discharge Apply bactroban to effected area. Most likely from ingrown hair. As discussed, cannot rule out herpes. If develop more sores/blisters, please let us known. Cytology sent. You were treated empirically for BV metrogel as directed. You have a prescriptions of diflucan in the pharmacy, if having vaginal itching/clumpy discharge, can fill to take.  Monitor for any worsening of symptoms, fever, abdominal pain, nausea, vomiting, to follow up for reevaluation.

## 2019-08-07 NOTE — ED Triage Notes (Signed)
Erin Jacobson presents with complaints of headache and continued ear fullness after being treated for a ear infection x one week ago.  She is also concerned about some vaginal discomfort that she is having with voiding and a small amount of discharge.

## 2019-08-07 NOTE — ED Provider Notes (Signed)
[name]    CSN: [id number] Arrival date & time: 08/07/19  1334      History   Chief Complaint Chief Complaint  Patient presents with  . Headache  . Ear Fullness  . Dysuria    HPI Erin Jacobson is a 29 y.o. female.   29 year old female comes in for multiple complaints  1. Continue ear fullness after being seen 1 week ago. At the time, had otitis externa, and was treated with cortisporin. Since then, has had resolution of ear pain. Still with ear fullness. Has nasal congestion. Denies cough, sore throat, fever.   2.  Noticed vaginal sore 3 days ago. Now with vaginal discharge. No vaginal itching. Mild vaginal odor. Burning to the vaginal sore when urinating. Denies urethral burning. Denies urinary frequency, urgency. LMP 07/17/2019. Sexually active, 1 female partner, no condom use. States sore may be a slight tear due to intercourse activity. No history of HSV.      Past Medical History:  Diagnosis Date  . Complication of anesthesia   . Depression    on meds, doing ok  . DVT of upper extremity (deep vein thrombosis) (HCC)   . GERD (gastroesophageal reflux disease)    no current meds for reflux  . Infection    UTI  . Kidney stone    stent and foley placed to pass kidney stones 05/05/2014  . PONV (postoperative nausea and vomiting)   . Trichomoniasis     Patient Active Problem List   Diagnosis Date Noted  . Normal labor 06/14/2018  . Preterm labor 06/14/2018  . Group B Streptococcus carrier, antepartum 06/14/2018  . Pruritus of pregnancy, antepartum, third trimester 05/31/2018  . Pneumonia affecting pregnancy in third trimester 05/31/2018  . Anemia in pregnancy 04/12/2018  . History of poor fetal growth 04/09/2018  . Late prenatal care 04/09/2018  . History of sexually transmitted disease 04/09/2018  . Non-compliant pregnant patient 04/09/2018  . Anxiety and depression 04/09/2018  . Supervision of high risk pregnancy, antepartum 03/12/2018  .  History of cesarean delivery 03/12/2018  . History of deep vein thrombosis (DVT) during pregnancy 03/12/2018    Past Surgical History:  Procedure Laterality Date  . APPENDECTOMY  04/01/2004   ex. lap.  . CESAREAN SECTION N/A 06/11/2015   Procedure: CESAREAN SECTION;  Surgeon: Marlow Baars, MD;  Location: [facility];  Service: Obstetrics;  Laterality: N/A;  . CYSTOSCOPY WITH RETROGRADE PYELOGRAM, URETEROSCOPY AND STENT PLACEMENT Left 05/05/2014   Procedure: LEFT  URETEROSCOPY,  RETROGRADE PYELOGRAM,  AND STENT PLACEMENT, STONE REMOVAL;  Surgeon: Crist Fat, MD;  Location: [facility];  Service: Urology;  Laterality: Left;  . HOLMIUM LASER APPLICATION Left 05/05/2014   Procedure: HOLMIUM LASER APPLICATION;  Surgeon: Crist Fat, MD;  Location: [facility];  Service: Urology;  Laterality: Left;  . KNEE SURGERY    . LAPAROSCOPIC TUBAL LIGATION Bilateral 09/29/2018   Procedure: LAPAROSCOPIC TUBAL LIGATION WITH FILSHIE CLIPS;  Surgeon: Allie Bossier, MD;  Location: [facility];  Service: Gynecology;  Laterality: Bilateral;    OB History    Gravida  3   Para  3   Term  2   Preterm  1   AB      Living  3     SAB      TAB      Ectopic      Multiple  0   Live Births  3  Home Medications    Prior to Admission medications   Medication Sig Start Date End Date Taking? Authorizing Provider  albuterol (PROVENTIL HFA;VENTOLIN HFA) 108 (90 Base) MCG/ACT inhaler Inhale 2 puffs into the lungs every 6 (six) hours as needed for wheezing or shortness of breath. 05/28/18   Katrinka Blazing, [place], CNM  azelastine (ASTELIN) 0.1 % nasal spray Place 2 sprays into both nostrils 2 (two) times daily. 08/07/19   Cathie Hoops, Amy V, PA-C  fluconazole (DIFLUCAN) 150 MG tablet Take 1 tablet (150 mg total) by mouth daily. Take second dose 72 hours later if symptoms still persists. 08/07/19   Cathie Hoops, Amy V, PA-C  fluticasone (FLONASE) 50 MCG/ACT nasal spray Place 2 sprays into both nostrils daily.  08/07/19   Cathie Hoops, Amy V, PA-C  ibuprofen (ADVIL) 600 MG tablet Take 1 tablet (600 mg total) by mouth every 6 (six) hours as needed. 09/29/18   Allie Bossier, MD  metroNIDAZOLE (METROGEL VAGINAL) 0.75 % vaginal gel Place 1 Applicatorful vaginally at bedtime for 5 days. 08/07/19 08/12/19  Belinda Fisher, PA-C  mupirocin ointment (BACTROBAN) 2 % Apply 1 application topically 2 (two) times daily. 08/07/19   Cathie Hoops, Amy V, PA-C  neomycin-polymyxin-hydrocortisone (CORTISPORIN) OTIC solution Place 3 drops into the right ear 4 (four) times daily for 7 days. 07/31/19 08/07/19  Hall-Potvin, [place], PA-C    Family History Family History  Problem Relation Age of Onset  . Anesthesia problems Mother        post-op N/V, hard to wake up  . Arthritis Mother   . Cancer Maternal Grandfather     Social History Social History   Tobacco Use  . Smoking status: Current Some Day Smoker    Types: Cigars    Last attempt to quit: 09/24/2014    Years since quitting: 4.8  . Smokeless tobacco: Never Used  Substance Use Topics  . Alcohol use: Not Currently    Comment: occasional/ not since pregnancy  . Drug use: No     Allergies   Patient has no known allergies.   Review of Systems Review of Systems  Reason unable to perform ROS: See HPI as above.     Physical Exam Triage Vital Signs ED Triage Vitals  Enc Vitals Group     BP 08/07/19 1351 123/80     Pulse Rate 08/07/19 1351 (!) 104     Resp 08/07/19 1351 15     Temp 08/07/19 1351 99.1 F (37.3 C)     Temp Source 08/07/19 1351 Oral     SpO2 08/07/19 1351 98 %     Weight --      Height --      Head Circumference --      Peak Flow --      Pain Score 08/07/19 1352 6     Pain Loc --      Pain Edu? --      Excl. in [place]? --    No data found.  Updated Vital Signs BP 123/80 (BP Location: Left Arm)   Pulse (!) 104   Temp 99.1 F (37.3 C) (Oral)   Resp 15   LMP 07/20/2019   SpO2 98%   Physical Exam Exam conducted with a chaperone present.  Constitutional:       General: She is not in acute distress.    Appearance: Normal appearance. She is well-developed. She is not toxic-appearing or diaphoretic.  HENT:     Head: Normocephalic and atraumatic.     Right Ear:  Ear canal and external ear normal. Tympanic membrane is erythematous. Tympanic membrane is not bulging.     Left Ear: Tympanic membrane, ear canal and external ear normal. Tympanic membrane is not erythematous or bulging.  Eyes:     Conjunctiva/sclera: Conjunctivae normal.     Pupils: Pupils are equal, round, and reactive to light.  Pulmonary:     Effort: Pulmonary effort is normal. No respiratory distress.     Comments: Speaking in full sentences without difficulty Abdominal:     General: Bowel sounds are normal.     Tenderness: There is no abdominal tenderness. There is no guarding or rebound.  Genitourinary:      Comments: No other lesions noted.  Musculoskeletal:     Cervical back: Normal range of motion and neck supple.  Skin:    General: Skin is warm and dry.  Neurological:     Mental Status: She is alert and oriented to person, place, and time.      UC Treatments / Results  Labs (all labs ordered are listed, but only abnormal results are displayed) Labs Reviewed  POCT URINALYSIS DIP (MANUAL ENTRY) - Abnormal; Notable for the following components:      Result Value   Ketones, POC UA trace (5) (*)    Blood, UA trace-intact (*)    Protein Ur, POC trace (*)    All other components within normal limits  CERVICOVAGINAL ANCILLARY ONLY    EKG   Radiology No results found.  Procedures Procedures (including critical care time)  Medications Ordered in UC Medications - No data to display  Initial Impression / Assessment and Plan / UC Course  I have reviewed the triage vital signs and the nursing notes.  Pertinent labs & imaging results that were available during my care of the patient were reviewed by me and considered in my medical decision making (see chart for  details).    1. Right ear fullness Otitis externa resolved. Likely due to eustachian tube dysfunction. Nasal spray as directed. Return precautions given.  2. Vaginal sore/folliculitis/vaginal discharge Discussed cannot rule out HSV. However, no history of HSV, single sore surrounding hair follicle consistent with folliculitis, likely from shaving. Will provide bactroban ointment. Cytology sent for testing. Patient with history of BV and feels the same, will cover with metrogel. Diflucan if needed for yeast. Return precautions given, patient expresses understanding and agrees to plan.  Final Clinical Impressions(s) / UC Diagnoses   Final diagnoses:  Ear fullness, right  Folliculitis  Vaginal discharge    ED Prescriptions    Medication Sig Dispense Auth. Provider   fluticasone (FLONASE) 50 MCG/ACT nasal spray Place 2 sprays into both nostrils daily. 1 g Yu, Amy V, PA-C   azelastine (ASTELIN) 0.1 % nasal spray Place 2 sprays into both nostrils 2 (two) times daily. 30 mL Yu, Amy V, PA-C   mupirocin ointment (BACTROBAN) 2 % Apply 1 application topically 2 (two) times daily. 22 g Yu, Amy V, PA-C   metroNIDAZOLE (METROGEL VAGINAL) 0.75 % vaginal gel Place 1 Applicatorful vaginally at bedtime for 5 days. 50 g Yu, Amy V, PA-C   fluconazole (DIFLUCAN) 150 MG tablet Take 1 tablet (150 mg total) by mouth daily. Take second dose 72 hours later if symptoms still persists. 2 tablet Ok Edwards, PA-C     PDMP not reviewed this encounter.   Ok Edwards, PA-C 08/07/19 1525

## 2019-08-09 LAB — CERVICOVAGINAL ANCILLARY ONLY
Chlamydia: NEGATIVE
Comment: NEGATIVE
Comment: NEGATIVE
Comment: NORMAL
Neisseria Gonorrhea: NEGATIVE
Trichomonas: NEGATIVE

## 2019-08-24 ENCOUNTER — Emergency Department (HOSPITAL_COMMUNITY)
Admission: EM | Admit: 2019-08-24 | Discharge: 2019-08-24 | Disposition: A | Discharge status: Home / Self Care | Payer: Medicaid Other | Attending: Emergency Medicine | Admitting: Emergency Medicine

## 2019-08-24 ENCOUNTER — Encounter (HOSPITAL_COMMUNITY): Payer: Self-pay | Admitting: Emergency Medicine

## 2019-08-24 DIAGNOSIS — R222 Localized swelling, mass and lump, trunk: Secondary | ICD-10-CM | POA: Diagnosis not present

## 2019-08-24 DIAGNOSIS — Z5321 Procedure and treatment not carried out due to patient leaving prior to being seen by health care provider: Secondary | ICD-10-CM | POA: Diagnosis not present

## 2019-08-24 DIAGNOSIS — R1032 Left lower quadrant pain: Secondary | ICD-10-CM | POA: Diagnosis not present

## 2019-08-24 DIAGNOSIS — R1031 Right lower quadrant pain: Secondary | ICD-10-CM | POA: Insufficient documentation

## 2019-08-24 LAB — URINALYSIS, ROUTINE W REFLEX MICROSCOPIC
Bilirubin Urine: NEGATIVE
Glucose, UA: NEGATIVE mg/dL
Hgb urine dipstick: NEGATIVE
Ketones, ur: NEGATIVE mg/dL
Leukocytes,Ua: NEGATIVE
Nitrite: NEGATIVE
Protein, ur: NEGATIVE mg/dL
Specific Gravity, Urine: 1.024 (ref 1.005–1.030)
pH: 6 (ref 5.0–8.0)

## 2019-08-24 LAB — COMPREHENSIVE METABOLIC PANEL
ALT: 16 U/L (ref 0–44)
AST: 32 U/L (ref 15–41)
Albumin: 4.1 g/dL (ref 3.5–5.0)
Alkaline Phosphatase: 63 U/L (ref 38–126)
Anion gap: 13 (ref 5–15)
BUN: 19 mg/dL (ref 6–20)
CO2: 24 mmol/L (ref 22–32)
Calcium: 9.3 mg/dL (ref 8.9–10.3)
Chloride: 105 mmol/L (ref 98–111)
Creatinine, Ser: 0.71 mg/dL (ref 0.44–1.00)
GFR calc Af Amer: >60 mL/min (ref >60)
GFR calc non Af Amer: >60 mL/min (ref >60)
Glucose, Bld: 98 mg/dL (ref 70–99)
Potassium: 4.4 mmol/L (ref 3.5–5.1)
Sodium: 142 mmol/L (ref 135–145)
Total Bilirubin: 0.9 mg/dL (ref 0.3–1.2)
Total Protein: 7.5 g/dL (ref 6.5–8.1)

## 2019-08-24 LAB — HCG, QUANTITATIVE, PREGNANCY: hCG, Beta Chain, Quant, S: <1 m[IU]/mL (ref <5)

## 2019-08-24 LAB — CBC
HCT: 41.9 % (ref 36.0–46.0)
Hemoglobin: 13.5 g/dL (ref 12.0–15.0)
MCH: 29.8 pg (ref 26.0–34.0)
MCHC: 32.2 g/dL (ref 30.0–36.0)
MCV: 92.5 fL (ref 80.0–100.0)
Platelets: 303 10*3/uL (ref 150–400)
RBC: 4.53 MIL/uL (ref 3.87–5.11)
RDW: 13 % (ref 11.5–15.5)
WBC: 10.7 10*3/uL — ABNORMAL HIGH (ref 4.0–10.5)
nRBC: 0 % (ref 0.0–0.2)

## 2019-08-24 LAB — LIPASE, BLOOD: Lipase: 26 U/L (ref 11–51)

## 2019-08-24 MED ORDER — SODIUM CHLORIDE 0.9% FLUSH: 3.0000 mL | Freq: Once | INTRAVENOUS | Status: DC

## 2019-08-24 NOTE — ED Triage Notes (Signed)
Patient presents C/O abdominal pain, flank pain, and a "lump" in her groin. Present X1 week. Denies N/V/D.

## 2019-08-24 NOTE — ED Notes (Signed)
Pt called for room x2 no response.  

## 2019-08-26 ENCOUNTER — Other Ambulatory Visit: Payer: Self-pay | Admitting: Physician Assistant

## 2019-08-26 DIAGNOSIS — R1909 Other intra-abdominal and pelvic swelling, mass and lump: Secondary | ICD-10-CM

## 2019-08-26 DIAGNOSIS — R109 Unspecified abdominal pain: Secondary | ICD-10-CM

## 2019-09-07 ENCOUNTER — Ambulatory Visit
Admission: RE | Admit: 2019-09-07 | Discharge: 2019-09-07 | Disposition: A | Discharge status: Home / Self Care | Payer: Medicaid Other | Source: Ambulatory Visit | Attending: Physician Assistant | Admitting: Physician Assistant

## 2019-09-07 ENCOUNTER — Other Ambulatory Visit: Payer: Self-pay | Admitting: Physician Assistant

## 2019-09-07 DIAGNOSIS — R1909 Other intra-abdominal and pelvic swelling, mass and lump: Secondary | ICD-10-CM

## 2019-09-07 DIAGNOSIS — N941 Unspecified dyspareunia: Secondary | ICD-10-CM

## 2019-09-07 DIAGNOSIS — R109 Unspecified abdominal pain: Secondary | ICD-10-CM

## 2019-10-24 ENCOUNTER — Other Ambulatory Visit: Payer: Self-pay

## 2019-10-24 ENCOUNTER — Other Ambulatory Visit (HOSPITAL_COMMUNITY)
Admission: RE | Admit: 2019-10-24 | Discharge: 2019-10-24 | Disposition: A | Discharge status: Home / Self Care | Payer: Medicaid Other | Source: Ambulatory Visit | Attending: Family Medicine | Admitting: Family Medicine

## 2019-10-24 ENCOUNTER — Ambulatory Visit (INDEPENDENT_AMBULATORY_CARE_PROVIDER_SITE_OTHER): Payer: Medicaid Other | Admitting: *Deleted

## 2019-10-24 DIAGNOSIS — N898 Other specified noninflammatory disorders of vagina: Secondary | ICD-10-CM | POA: Insufficient documentation

## 2019-10-24 NOTE — Progress Notes (Signed)
Having white/gray vaginal discharge with odor for 1 wk. Has frequent BV and thinks that is what is causing d/c. Recently changed soaps and feels this may have caused it. Denies any pain. Instructed in obtaining vag swabs and voiced understanding. Has mychart so will look for results in next few days. Understands will be notified via mychart if there are any concerns needing treatment. Pt voices understanding and agrees with POC

## 2019-10-24 NOTE — Progress Notes (Signed)
Chart reviewed for nurse visit. Agree with plan of care.   Marny Lowenstein, PA-C 10/24/2019 4:40 PM

## 2019-10-25 ENCOUNTER — Telehealth: Payer: Self-pay

## 2019-10-25 ENCOUNTER — Other Ambulatory Visit: Payer: Self-pay | Admitting: Medical

## 2019-10-25 DIAGNOSIS — B379 Candidiasis, unspecified: Secondary | ICD-10-CM

## 2019-10-25 DIAGNOSIS — B9689 Other specified bacterial agents as the cause of diseases classified elsewhere: Secondary | ICD-10-CM

## 2019-10-25 DIAGNOSIS — N760 Acute vaginitis: Secondary | ICD-10-CM

## 2019-10-25 LAB — CERVICOVAGINAL ANCILLARY ONLY
Bacterial Vaginitis (gardnerella): POSITIVE — AB
Candida Glabrata: NEGATIVE
Candida Vaginitis: POSITIVE — AB
Chlamydia: NEGATIVE
Comment: NEGATIVE
Comment: NEGATIVE
Comment: NEGATIVE
Comment: NEGATIVE
Comment: NEGATIVE
Comment: NORMAL
Neisseria Gonorrhea: NEGATIVE
Trichomonas: NEGATIVE

## 2019-10-25 MED ORDER — METRONIDAZOLE 500 MG PO TABS: 500.0000 mg | ORAL_TABLET | Freq: Two times a day (BID) | ORAL | 0 refills | Status: DC

## 2019-10-25 MED ORDER — FLUCONAZOLE 150 MG PO TABS: 150.0000 mg | ORAL_TABLET | Freq: Every day | ORAL | 0 refills | Status: DC

## 2019-10-25 MED ORDER — METRONIDAZOLE 0.75 % VA GEL: 1.0000 | Freq: Two times a day (BID) | VAGINAL | 0 refills | Status: DC

## 2019-10-25 NOTE — Telephone Encounter (Signed)
Pt called requesting to have Metrogel prescribed instead of the tablets.  L/M for pt that the medication she requested has been sent to her [facility] pharmacy on [address].  If she has any questions to please give the office a call.   Addison Naegeli, RN  10/25/19

## 2020-04-12 ENCOUNTER — Ambulatory Visit (INDEPENDENT_AMBULATORY_CARE_PROVIDER_SITE_OTHER): Payer: Medicaid Other | Admitting: *Deleted

## 2020-04-12 ENCOUNTER — Encounter: Payer: Self-pay | Admitting: *Deleted

## 2020-04-12 ENCOUNTER — Other Ambulatory Visit (HOSPITAL_COMMUNITY)
Admission: RE | Admit: 2020-04-12 | Discharge: 2020-04-12 | Disposition: A | Discharge status: Home / Self Care | Payer: Medicaid Other | Source: Ambulatory Visit | Attending: Family Medicine | Admitting: Family Medicine

## 2020-04-12 ENCOUNTER — Other Ambulatory Visit: Payer: Self-pay

## 2020-04-12 VITALS — BP 119/72 | HR 97 | Ht 60.0 in | Wt 161.0 lb

## 2020-04-12 DIAGNOSIS — Z9189 Other specified personal risk factors, not elsewhere classified: Secondary | ICD-10-CM | POA: Diagnosis not present

## 2020-04-12 DIAGNOSIS — N898 Other specified noninflammatory disorders of vagina: Secondary | ICD-10-CM

## 2020-04-12 NOTE — Progress Notes (Signed)
Pt reports white vaginal discharge x3 days. She denies odor, itching or irritation. Pt wants to be checked for possible yeast, BV and STI's including Syphilis and HIV due to occasional unprotected sex. Self swab was obtained as well as labs drawn. Pt was advised that she will be notified of results as well as treatment indicated if any via Mychart. Pt needs Annual Gyn exam and will schedule @ check out today. She voiced understanding.

## 2020-04-13 LAB — CERVICOVAGINAL ANCILLARY ONLY
Bacterial Vaginitis (gardnerella): NEGATIVE
Candida Glabrata: NEGATIVE
Candida Vaginitis: NEGATIVE
Chlamydia: NEGATIVE
Comment: NEGATIVE
Comment: NEGATIVE
Comment: NEGATIVE
Comment: NEGATIVE
Comment: NEGATIVE
Comment: NORMAL
Neisseria Gonorrhea: NEGATIVE
Trichomonas: POSITIVE — AB

## 2020-04-13 LAB — RPR: RPR Ser Ql: NONREACTIVE

## 2020-04-13 LAB — HIV ANTIBODY (ROUTINE TESTING W REFLEX): HIV Screen 4th Generation wRfx: NONREACTIVE

## 2020-04-14 ENCOUNTER — Other Ambulatory Visit: Payer: Self-pay | Admitting: Student

## 2020-04-14 MED ORDER — METRONIDAZOLE 500 MG PO TABS: 2000.0000 mg | ORAL_TABLET | Freq: Once | ORAL | 0 refills | Status: AC

## 2020-04-14 NOTE — Progress Notes (Signed)
Chart reviewed for nurse visit. Agree with plan of care.   Dorthey Depace Lorraine, CNM 04/14/2020 9:39 AM   

## 2020-04-20 ENCOUNTER — Other Ambulatory Visit: Payer: Self-pay

## 2020-04-20 ENCOUNTER — Encounter: Payer: Self-pay | Admitting: Emergency Medicine

## 2020-04-20 ENCOUNTER — Ambulatory Visit
Admission: EM | Admit: 2020-04-20 | Discharge: 2020-04-20 | Disposition: A | Discharge status: Home / Self Care | Payer: Medicaid Other

## 2020-04-20 DIAGNOSIS — J069 Acute upper respiratory infection, unspecified: Secondary | ICD-10-CM | POA: Diagnosis not present

## 2020-04-20 MED ORDER — BENZONATATE 100 MG PO CAPS
100.0000 mg | ORAL_CAPSULE | Freq: Three times a day (TID) | ORAL | 0 refills | Status: DC
Start: 2020-04-20 — End: 2020-05-15

## 2020-04-20 MED ORDER — CETIRIZINE HCL 10 MG PO TABS
10.0000 mg | ORAL_TABLET | Freq: Every day | ORAL | 0 refills | Status: DC
Start: 2020-04-20 — End: 2020-05-15

## 2020-04-20 MED ORDER — FLUTICASONE PROPIONATE 50 MCG/ACT NA SUSP
1.0000 | Freq: Every day | NASAL | 0 refills | Status: DC
Start: 2020-04-20 — End: 2020-05-15

## 2020-04-20 NOTE — Discharge Instructions (Signed)

## 2020-04-20 NOTE — ED Triage Notes (Signed)
Pt states that she is having nasal congestion, ear pain (R), and a cough. Pt states that her sx started Monday. Pt states that she did a covid test at [facility] yesterday and the results were negative.

## 2020-04-20 NOTE — ED Provider Notes (Signed)
[name]    CSN: [id number] Arrival date & time: 04/20/20  1601      History   Chief Complaint Chief Complaint  Patient presents with  . Cough  . Nasal Congestion  . Otalgia    HPI Erin Jacobson is a 30 y.o. female  With history as below presenting for day 5 of nasal congestion, ear pain, dry cough.  No difficulty breathing, chest pain, fever.  Has taken OTC cold medications with some relief.  Took a COVID test yesterday [facility] that was negative.  No known Covid exposures.  Past Medical History:  Diagnosis Date  . Complication of anesthesia   . Depression    on meds, doing ok  . DVT of upper extremity (deep vein thrombosis) (HCC)   . GERD (gastroesophageal reflux disease)    no current meds for reflux  . Infection    UTI  . Kidney stone    stent and foley placed to pass kidney stones 05/05/2014  . PONV (postoperative nausea and vomiting)   . Trichomoniasis     Patient Active Problem List   Diagnosis Date Noted  . Normal labor 06/14/2018  . Preterm labor 06/14/2018  . Group B Streptococcus carrier, antepartum 06/14/2018  . Pruritus of pregnancy, antepartum, third trimester 05/31/2018  . Pneumonia affecting pregnancy in third trimester 05/31/2018  . Anemia in pregnancy 04/12/2018  . History of poor fetal growth 04/09/2018  . Late prenatal care 04/09/2018  . History of sexually transmitted disease 04/09/2018  . Non-compliant pregnant patient 04/09/2018  . Anxiety and depression 04/09/2018  . Supervision of high risk pregnancy, antepartum 03/12/2018  . History of cesarean delivery 03/12/2018  . History of deep vein thrombosis (DVT) during pregnancy 03/12/2018    Past Surgical History:  Procedure Laterality Date  . APPENDECTOMY  04/01/2004   ex. lap.  . CESAREAN SECTION N/A 06/11/2015   Procedure: CESAREAN SECTION;  Surgeon: Marlow Baars, MD;  Location: [facility];  Service: Obstetrics;  Laterality: N/A;  . CYSTOSCOPY WITH RETROGRADE PYELOGRAM,  URETEROSCOPY AND STENT PLACEMENT Left 05/05/2014   Procedure: LEFT  URETEROSCOPY,  RETROGRADE PYELOGRAM,  AND STENT PLACEMENT, STONE REMOVAL;  Surgeon: Crist Fat, MD;  Location: [facility];  Service: Urology;  Laterality: Left;  . HOLMIUM LASER APPLICATION Left 05/05/2014   Procedure: HOLMIUM LASER APPLICATION;  Surgeon: Crist Fat, MD;  Location: [facility];  Service: Urology;  Laterality: Left;  . KNEE SURGERY    . LAPAROSCOPIC TUBAL LIGATION Bilateral 09/29/2018   Procedure: LAPAROSCOPIC TUBAL LIGATION WITH FILSHIE CLIPS;  Surgeon: Allie Bossier, MD;  Location:  [facility];  Service: Gynecology;  Laterality: Bilateral;    OB History    Gravida  3   Para  3   Term  2   Preterm  1   AB      Living  3     SAB      IAB      Ectopic      Multiple  0   Live Births  3            Home Medications    Prior to Admission medications   Medication Sig Start Date End Date Taking? Authorizing Provider  benzonatate (TESSALON) 100 MG capsule Take 1 capsule (100 mg total) by mouth every 8 (eight) hours. 04/20/20  Yes Hall-Potvin, [place], PA-C  cetirizine (ZYRTEC ALLERGY) 10 MG tablet Take 1 tablet (10 mg total) by mouth daily. 04/20/20  Yes Hall-Potvin, [place],  PA-C  fluticasone (FLONASE) 50 MCG/ACT nasal spray Place 1 spray into both nostrils daily. 04/20/20  Yes Hall-Potvin, [place], PA-C  albuterol (PROVENTIL HFA;VENTOLIN HFA) 108 (90 Base) MCG/ACT inhaler Inhale 2 puffs into the lungs every 6 (six) hours as needed for wheezing or shortness of breath. Patient not taking: Reported on 04/12/2020 05/28/18 04/20/20  Katrinka Blazing, [place], CNM  azelastine (ASTELIN) 0.1 % nasal spray Place 2 sprays into both nostrils 2 (two) times daily. Patient not taking: Reported on 04/12/2020 08/07/19 04/20/20  Lurline Idol    Family History Family History  Problem Relation Age of Onset  . Anesthesia problems Mother        post-op N/V, hard to wake up  . Arthritis Mother   .  Cancer Maternal Grandfather     Social History Social History   Tobacco Use  . Smoking status: Current Some Day Smoker    Types: Cigars    Last attempt to quit: 09/24/2014    Years since quitting: 5.5  . Smokeless tobacco: Never Used  Vaping Use  . Vaping Use: Never used  Substance Use Topics  . Alcohol use: Not Currently    Comment: occasional/ not since pregnancy  . Drug use: No     Allergies   Patient has no known allergies.   Review of Systems Review of Systems  Constitutional: Negative for fatigue and fever.  HENT: Positive for congestion and ear pain. Negative for dental problem, ear discharge, facial swelling, hearing loss, sinus pain, sore throat, trouble swallowing and voice change.   Eyes: Negative for photophobia, pain and visual disturbance.  Respiratory: Positive for cough. Negative for shortness of breath.   Cardiovascular: Negative for chest pain and palpitations.  Gastrointestinal: Negative for diarrhea and vomiting.  Musculoskeletal: Negative for arthralgias and myalgias.  Neurological: Negative for dizziness and headaches.     Physical Exam Triage Vital Signs ED Triage Vitals  Enc Vitals Group     BP 04/20/20 1705 118/78     Pulse Rate 04/20/20 1705 98     Resp 04/20/20 1705 18     Temp 04/20/20 1705 98.5 F (36.9 C)     Temp Source 04/20/20 1705 Oral     SpO2 04/20/20 1705 97 %     Weight --      Height --      Head Circumference --      Peak Flow --      Pain Score 04/20/20 1703 7     Pain Loc --      Pain Edu? --      Excl. in GC? --    No data found.  Updated Vital Signs BP 118/78 (BP Location: Left Arm)   Pulse 98   Temp 98.5 F (36.9 C) (Oral)   Resp 18   LMP 04/13/2020   SpO2 97%   Breastfeeding No   Visual Acuity Right Eye Distance:   Left Eye Distance:   Bilateral Distance:    Right Eye Near:   Left Eye Near:    Bilateral Near:     Physical Exam Constitutional:      General: She is not in acute distress.     Appearance: She is not ill-appearing or diaphoretic.  HENT:     Head: Normocephalic and atraumatic.     Right Ear: Tympanic membrane and ear canal normal.     Left Ear: Tympanic membrane and ear canal normal.     Mouth/Throat:     Mouth: Mucous membranes  are moist.     Pharynx: Oropharynx is clear. No oropharyngeal exudate or posterior oropharyngeal erythema.  Eyes:     General: No scleral icterus.    Conjunctiva/sclera: Conjunctivae normal.     Pupils: Pupils are equal, round, and reactive to light.  Neck:     Comments: Trachea midline, negative JVD Cardiovascular:     Rate and Rhythm: Normal rate and regular rhythm.     Heart sounds: No murmur heard. No gallop.   Pulmonary:     Effort: Pulmonary effort is normal. No respiratory distress.     Breath sounds: No wheezing, rhonchi or rales.  Musculoskeletal:     Cervical back: Neck supple. No tenderness.  Lymphadenopathy:     Cervical: No cervical adenopathy.  Skin:    Capillary Refill: Capillary refill takes less than 2 seconds.     Coloration: Skin is not jaundiced or pale.     Findings: No rash.  Neurological:     General: No focal deficit present.     Mental Status: She is alert and oriented to person, place, and time.      UC Treatments / Results  Labs (all labs ordered are listed, but only abnormal results are displayed) Labs Reviewed - No data to display  EKG   Radiology No results found.  Procedures Procedures (including critical care time)  Medications Ordered in UC Medications - No data to display  Initial Impression / Assessment and Plan / UC Course  I have reviewed the triage vital signs and the nursing notes.  Pertinent labs & imaging results that were available during my care of the patient were reviewed by me and considered in my medical decision making (see chart for details).     Patient febrile, nontoxic in office today.  Exam reassuring: Likely viral process.  Discussed possibility of false  negative with early testing and Covid: Declined repeat exam.  Will wear appropriate PPE, monitor for additional signs/symptoms.  Supportively as below.  Return precautions discussed, pt verbalized understanding and is agreeable to plan. Final Clinical Impressions(s) / UC Diagnoses   Final diagnoses:  URI with cough and congestion     Discharge Instructions     Tessalon for cough. Start flonase, atrovent nasal spray for nasal congestion/drainage. You can use over the counter nasal saline rinse such as neti pot for nasal congestion. Keep hydrated, your urine should be clear to pale yellow in color. Tylenol/motrin for fever and pain. Monitor for any worsening of symptoms, chest pain, shortness of breath, wheezing, swelling of the throat, go to the emergency department for further evaluation needed.     ED Prescriptions    Medication Sig Dispense Auth. Provider   cetirizine (ZYRTEC ALLERGY) 10 MG tablet Take 1 tablet (10 mg total) by mouth daily. 30 tablet Hall-Potvin, [place], PA-C   fluticasone (FLONASE) 50 MCG/ACT nasal spray Place 1 spray into both nostrils daily. 16 g Hall-Potvin, [place], PA-C   benzonatate (TESSALON) 100 MG capsule Take 1 capsule (100 mg total) by mouth every 8 (eight) hours. 21 capsule Hall-Potvin, [place], PA-C     PDMP not reviewed this encounter.   Hall-Potvin, [place], [place] 04/20/20 1804

## 2020-05-01 ENCOUNTER — Telehealth: Payer: Self-pay | Admitting: Family Medicine

## 2020-05-01 NOTE — Telephone Encounter (Signed)
Pt states she came in 04-12-20 for self swab and was given some meds but states she is still having same issues and needs to know what to do. Pt request a call back

## 2020-05-01 NOTE — Telephone Encounter (Signed)
Left message for patient that I am returning her call if she continues to have questions or concerns to please give the office a call or send [place] a MyChart message.    Addison Naegeli, RN  05/01/20

## 2020-05-15 ENCOUNTER — Ambulatory Visit (INDEPENDENT_AMBULATORY_CARE_PROVIDER_SITE_OTHER): Payer: Medicaid Other | Admitting: General Practice

## 2020-05-15 ENCOUNTER — Other Ambulatory Visit: Payer: Self-pay

## 2020-05-15 ENCOUNTER — Telehealth: Payer: Self-pay | Admitting: Clinical

## 2020-05-15 ENCOUNTER — Other Ambulatory Visit: Payer: Self-pay | Admitting: Clinical

## 2020-05-15 ENCOUNTER — Ambulatory Visit: Payer: Medicaid Other | Admitting: Clinical

## 2020-05-15 ENCOUNTER — Other Ambulatory Visit (HOSPITAL_COMMUNITY)
Admission: RE | Admit: 2020-05-15 | Discharge: 2020-05-15 | Disposition: A | Discharge status: Home / Self Care | Payer: Medicaid Other | Source: Ambulatory Visit | Attending: Family Medicine | Admitting: Family Medicine

## 2020-05-15 DIAGNOSIS — N898 Other specified noninflammatory disorders of vagina: Secondary | ICD-10-CM | POA: Insufficient documentation

## 2020-05-15 DIAGNOSIS — F332 Major depressive disorder, recurrent severe without psychotic features: Secondary | ICD-10-CM

## 2020-05-15 LAB — POCT URINALYSIS DIP (DEVICE)
Bilirubin Urine: NEGATIVE
Glucose, UA: NEGATIVE mg/dL
Hgb urine dipstick: NEGATIVE
Ketones, ur: NEGATIVE mg/dL
Leukocytes,Ua: NEGATIVE
Nitrite: NEGATIVE
Protein, ur: NEGATIVE mg/dL
Specific Gravity, Urine: >=1.03 (ref 1.005–1.030)
Urobilinogen, UA: 0.2 mg/dL (ref 0.0–1.0)
pH: 6 (ref 5.0–8.0)

## 2020-05-15 NOTE — BH Specialist Note (Signed)
Integrated Behavioral Health Initial In-Person Visit  MRN: [id number] Name: Erin Jacobson  Number of Integrated Behavioral Health Clinician visits:: 1/6 Session Start time: 9:00 Session End time: 9:10 Total time: 10 minutes  Types of Service: Other (Comment)Safety Plan  Interpretor:No. Interpretor Name and Language: n/a   Warm Hand Off Completed.       Subjective: Erin Jacobson is a 30 y.o. female accompanied by n/a Patient was referred by n/a for positive depression screen . Patient reports the following symptoms/concerns: Pt states she needs to get back on her BH medication (was previously on Zoloft, and felt more anxious after increasing dosage), and has been going through "a lot" lately, and would like to talk to someone about that today. Pt has SI the past two weeks, with no plan to harm herself today.    Severity of problem: severe  Objective: Mood: Depressed and Affect: Appropriate Risk of harm to self or others: Suicidal ideation No plan to harm self or others; no plan today   Interventions: Interventions utilized: Safety  Standardized Assessments completed: GAD-7 and PHQ 9  Patient and/or Family Response: Pt agrees to go to [facility] after leaving [facility]; agrees to referral to establish with psychiatry and ongoing therapy at [facility]. Pt also agrees for [facility] Vineeth Fell to call her back later today to check on her; agrees to call Asher Muir at [phone] if she has any problems starting back on [facility] medication at [facility], or any other questions or concerns  Plan: 1. Follow up with behavioral health clinician on : By phone again today; Call Teigan Sahli at [phone] as needed 2. Behavioral recommendations:  -Go to [facility] today, immediately upon leaving [facility] today -Accept referral to psychiatry for [facility] medication management and ongoing therapy  3. Referral(s): Integrated Programme researcher, broadcasting/film/video (In Clinic) and [facility] (LME/Outside Clinic)  Rae Lips, [place]  Depression screen [facility] 2/9 05/15/2020 10/24/2019 05/28/2018 04/09/2018 03/12/2018  Decreased Interest 1 0 2 1 0  Down, Depressed, Hopeless 3 1 3 1 1   PHQ - 2 Score 4 1 5 2 1   Altered sleeping 3 1 3 1 1   Tired, decreased energy 3 1 2 3 3   Change in appetite 2 1 2  0 1  Feeling bad or failure about yourself  3 2 1  0 1  Trouble concentrating 1 1 0 3 3  Moving slowly or fidgety/restless 1 0 0 0 0  Suicidal thoughts 2 1 2 1 1   PHQ-9 Score 19 8 15 10 11    GAD 7 : Generalized Anxiety Score 05/15/2020 10/24/2019 05/28/2018 04/09/2018  Nervous, Anxious, on Edge 2 1 3 2   Control/stop worrying 2 0 2 1  Worry too much - different things 3 0 3 3  Trouble relaxing 2 0 1 0  Restless 1 0 0 0  Easily annoyed or irritable 0 0 3 1  Afraid - awful might happen 1 0 1 0  Total GAD 7 Score 11 1 13  7

## 2020-05-15 NOTE — Telephone Encounter (Signed)
Follow up call, as agreed-upon by pt and [facility]; pt says she did not go to [facility] today, due to schedule, but plans to go tomorrow instead, to do that for herself before the weekend. Pt says she is doing okay right now, as she's spending time with her son, and no plan or intent to harm herself. Pt agrees if she begins having SI again, she will go into the [facility] 24/7. She is also aware she may call [facility] Reyne Falconi at [phone] as needed.

## 2020-05-15 NOTE — Progress Notes (Addendum)
Patient presents to office this morning requesting self swab due to thicker discharge with abnormal odor, and slight irritation x 2 weeks. She did request UA as well to rule out urinary tract infection. UA negative. Patient was instructed in self swab & specimen was collected. Discussed results take 24-48 hours to finalize and will be released to mychart. Advised if any result comes back abnormal we will reach out to you with results & any medication needed sent to pharmacy. Patient verbalized understanding.  Chase Caller RN BSN 05/15/20   Chart reviewed for nurse visit. Agree with plan of care.   Currie Paris, NP 05/16/2020 8:39 AM

## 2020-05-16 ENCOUNTER — Other Ambulatory Visit: Payer: Self-pay | Admitting: Nurse Practitioner

## 2020-05-16 ENCOUNTER — Other Ambulatory Visit: Payer: Self-pay | Admitting: *Deleted

## 2020-05-16 LAB — CERVICOVAGINAL ANCILLARY ONLY
Bacterial Vaginitis (gardnerella): POSITIVE — AB
Candida Glabrata: NEGATIVE
Candida Vaginitis: NEGATIVE
Chlamydia: NEGATIVE
Comment: NEGATIVE
Comment: NEGATIVE
Comment: NEGATIVE
Comment: NEGATIVE
Comment: NEGATIVE
Comment: NORMAL
Neisseria Gonorrhea: NEGATIVE
Trichomonas: NEGATIVE

## 2020-05-16 MED ORDER — METRONIDAZOLE 0.75 % VA GEL: 1.0000 | Freq: Every day | VAGINAL | 0 refills | Status: AC

## 2020-05-16 MED ORDER — METRONIDAZOLE 500 MG PO TABS
500.0000 mg | ORAL_TABLET | Freq: Two times a day (BID) | ORAL | 0 refills | Status: DC
Start: 2020-05-16 — End: 2020-05-16

## 2020-05-16 NOTE — Progress Notes (Signed)
Pt sent Mychart message requesting Metrogel to treat BV instead of Metronidazole tablets. Rx sent as prescribed and pharmacy was notified to D/C the Rx for tablets.

## 2020-05-17 ENCOUNTER — Telehealth: Payer: Self-pay | Admitting: Clinical

## 2020-05-17 NOTE — Telephone Encounter (Signed)
Left HIPPA-compliant message to call back Graison Leinberger from [facility] at  [phone] (Solita Macadam's office).   

## 2020-05-24 ENCOUNTER — Ambulatory Visit: Payer: Medicaid Other | Admitting: Advanced Practice Midwife

## 2020-06-18 ENCOUNTER — Other Ambulatory Visit: Payer: Self-pay

## 2020-06-18 ENCOUNTER — Ambulatory Visit
Admission: EM | Admit: 2020-06-18 | Discharge: 2020-06-18 | Disposition: A | Discharge status: Home / Self Care | Payer: Medicaid Other | Attending: Emergency Medicine | Admitting: Emergency Medicine

## 2020-06-18 ENCOUNTER — Encounter: Payer: Self-pay | Admitting: Emergency Medicine

## 2020-06-18 DIAGNOSIS — N898 Other specified noninflammatory disorders of vagina: Secondary | ICD-10-CM | POA: Diagnosis present

## 2020-06-18 MED ORDER — METRONIDAZOLE 0.75 % VA GEL: 1.0000 | Freq: Every day | VAGINAL | 0 refills | Status: AC

## 2020-06-18 NOTE — ED Provider Notes (Signed)
[name]    CSN: [id number] Arrival date & time: 06/18/20  1711      History   Chief Complaint Chief Complaint  Patient presents with  . Vaginal Discharge    HPI Erin Jacobson is a 30 y.o. female.   Erin Jacobson presents with complaints of vaginal discharge with odor for the past few days at least. She has had multiple episodes of bv in the past and this feels similar. States she was treated around a month ago but doesn't feel like it completely resolved. Periods have been more irregular but hasn't missed a period, has her tubes tied. No other vaginal bleeding or itching. Denies concerns for stds. No pelvic pain. No urinary symptoms. She is sexually active with one partner.     ROS per HPI, negative if not otherwise mentioned.      Past Medical History:  Diagnosis Date  . Complication of anesthesia   . Depression    on meds, doing ok  . DVT of upper extremity (deep vein thrombosis) (HCC)   . GERD (gastroesophageal reflux disease)    no current meds for reflux  . Infection    UTI  . Kidney stone    stent and foley placed to pass kidney stones 05/05/2014  . PONV (postoperative nausea and vomiting)   . Trichomoniasis     Patient Active Problem List   Diagnosis Date Noted  . Normal labor 06/14/2018  . Preterm labor 06/14/2018  . Group B Streptococcus carrier, antepartum 06/14/2018  . Pruritus of pregnancy, antepartum, third trimester 05/31/2018  . Pneumonia affecting pregnancy in third trimester 05/31/2018  . Anemia in pregnancy 04/12/2018  . History of poor fetal growth 04/09/2018  . Late prenatal care 04/09/2018  . History of sexually transmitted disease 04/09/2018  . Non-compliant pregnant patient 04/09/2018  . Anxiety and depression 04/09/2018  . Supervision of high risk pregnancy, antepartum 03/12/2018  . History of cesarean delivery 03/12/2018  . History of deep vein thrombosis (DVT) during pregnancy 03/12/2018    Past Surgical History:   Procedure Laterality Date  . APPENDECTOMY  04/01/2004   ex. lap.  . CESAREAN SECTION N/A 06/11/2015   Procedure: CESAREAN SECTION;  Surgeon: Marlow Baars, MD;  Location: [facility];  Service: Obstetrics;  Laterality: N/A;  . CYSTOSCOPY WITH RETROGRADE PYELOGRAM, URETEROSCOPY AND STENT PLACEMENT Left 05/05/2014   Procedure: LEFT  URETEROSCOPY,  RETROGRADE PYELOGRAM,  AND STENT PLACEMENT, STONE REMOVAL;  Surgeon: Crist Fat, MD;  Location: [facility];  Service: Urology;  Laterality: Left;  . HOLMIUM LASER APPLICATION Left 05/05/2014   Procedure: HOLMIUM LASER APPLICATION;  Surgeon: Crist Fat, MD;  Location: [facility];  Service: Urology;  Laterality: Left;  . KNEE SURGERY    . LAPAROSCOPIC TUBAL LIGATION Bilateral 09/29/2018   Procedure: LAPAROSCOPIC TUBAL LIGATION WITH FILSHIE CLIPS;  Surgeon: Allie Bossier, MD;  Location: [facility];  Service: Gynecology;  Laterality: Bilateral;    OB History    Gravida  3   Para  3   Term  2   Preterm  1   AB      Living  3     SAB      IAB      Ectopic      Multiple  0   Live Births  3            Home Medications    Prior to Admission medications   Medication Sig Start Date End  Date Taking? Authorizing Provider  metroNIDAZOLE (METROGEL VAGINAL) 0.75 % vaginal gel Place 1 Applicatorful vaginally at bedtime for 5 days. 06/18/20 06/23/20 Yes Aleksei Goodlin, Barron Al[place], NP  albuterol (PROVENTIL HFA;VENTOLIN HFA) 108 (90 Base) MCG/ACT inhaler Inhale 2 puffs into the lungs every 6 (six) hours as needed for wheezing or shortness of breath. Patient not taking: Reported on 04/12/2020 05/28/18 04/20/20  Katrinka Blazing, [place], CNM  azelastine (ASTELIN) 0.1 % nasal spray Place 2 sprays into both nostrils 2 (two) times daily. Patient not taking: Reported on 04/12/2020 08/07/19 04/20/20  Lurline Idol    Family History Family History  Problem Relation Age of Onset  . Anesthesia problems Mother        post-op N/V, hard to wake up  . Arthritis  Mother   . Cancer Maternal Grandfather     Social History Social History   Tobacco Use  . Smoking status: Current Some Day Smoker    Types: Cigars    Last attempt to quit: 09/24/2014    Years since quitting: 5.7  . Smokeless tobacco: Never Used  Vaping Use  . Vaping Use: Never used  Substance Use Topics  . Alcohol use: Yes  . Drug use: No     Allergies   Patient has no known allergies.   Review of Systems Review of Systems   Physical Exam Triage Vital Signs ED Triage Vitals  Enc Vitals Group     BP 06/18/20 1905 120/79     Pulse Rate 06/18/20 1905 94     Resp 06/18/20 1905 18     Temp 06/18/20 1905 98.4 F (36.9 C)     Temp Source 06/18/20 1905 Oral     SpO2 06/18/20 1905 97 %     Weight --      Height --      Head Circumference --      Peak Flow --      Pain Score 06/18/20 1903 0     Pain Loc --      Pain Edu? --      Excl. in GC? --    No data found.  Updated Vital Signs BP 120/79 (BP Location: Left Arm)   Pulse 94   Temp 98.4 F (36.9 C) (Oral)   Resp 18   LMP 06/08/2020   SpO2 97%   Visual Acuity Right Eye Distance:   Left Eye Distance:   Bilateral Distance:    Right Eye Near:   Left Eye Near:    Bilateral Near:     Physical Exam Constitutional:      General: She is not in acute distress.    Appearance: She is well-developed.  Cardiovascular:     Rate and Rhythm: Normal rate.  Pulmonary:     Effort: Pulmonary effort is normal.  Abdominal:     Palpations: Abdomen is not rigid.     Tenderness: There is no abdominal tenderness. There is no guarding or rebound.  Genitourinary:    Comments: Denies sores, lesions, vaginal bleeding; no pelvic pain; gu exam deferred at this time, vaginal self swab collected.   Skin:    General: Skin is warm and dry.  Neurological:     Mental Status: She is alert and oriented to person, place, and time.      UC Treatments / Results  Labs (all labs ordered are listed, but only abnormal results are  displayed) Labs Reviewed  CERVICOVAGINAL ANCILLARY ONLY    EKG   Radiology No results found.  Procedures Procedures (including critical care time)  Medications Ordered in UC Medications - No data to display  Initial Impression / Assessment and Plan / UC Course  I have reviewed the triage vital signs and the nursing notes.  Pertinent labs & imaging results that were available during my care of the patient were reviewed by me and considered in my medical decision making (see chart for details).     bv treatment pending due to history of recurrence, with vaginal cytology pending. Return precautions provided. Patient verbalized understanding and agreeable to plan.   Final Clinical Impressions(s) / UC Diagnoses   Final diagnoses:  Vaginal discharge     Discharge Instructions     Start metrogel as we await final testing from your vaginal swab.  We will notify of you any positive findings or if any changes to treatment are needed. If normal or otherwise without concern to your results, we will not call you. Please log on to your MyChart to review your results if interested in so.     ED Prescriptions    Medication Sig Dispense Auth. Provider   metroNIDAZOLE (METROGEL VAGINAL) 0.75 % vaginal gel Place 1 Applicatorful vaginally at bedtime for 5 days. 50 g Georgetta Haber, NP     PDMP not reviewed this encounter.   Georgetta Haber, NP 06/18/20 1949

## 2020-06-18 NOTE — Discharge Instructions (Addendum)
Start metrogel as we await final testing from your vaginal swab.  We will notify of you any positive findings or if any changes to treatment are needed. If normal or otherwise without concern to your results, we will not call you. Please log on to your MyChart to review your results if interested in so.

## 2020-06-18 NOTE — ED Triage Notes (Signed)
Patient reports vaginal discharge that started one week ago.  Patient was treated last month for BV and does not believe it went away

## 2020-06-20 LAB — CERVICOVAGINAL ANCILLARY ONLY
Bacterial Vaginitis (gardnerella): POSITIVE — AB
Candida Glabrata: NEGATIVE
Candida Vaginitis: NEGATIVE
Chlamydia: NEGATIVE
Comment: NEGATIVE
Comment: NEGATIVE
Comment: NEGATIVE
Comment: NEGATIVE
Comment: NEGATIVE
Comment: NORMAL
Neisseria Gonorrhea: NEGATIVE
Trichomonas: NEGATIVE

## 2020-06-28 ENCOUNTER — Ambulatory Visit: Payer: Medicaid Other | Admitting: Student

## 2020-10-08 ENCOUNTER — Ambulatory Visit: Payer: Medicaid Other

## 2020-10-11 ENCOUNTER — Other Ambulatory Visit (HOSPITAL_COMMUNITY)
Admission: RE | Admit: 2020-10-11 | Discharge: 2020-10-11 | Disposition: A | Discharge status: Home / Self Care | Payer: Medicaid Other | Source: Ambulatory Visit | Attending: Family Medicine | Admitting: Family Medicine

## 2020-10-11 ENCOUNTER — Ambulatory Visit (INDEPENDENT_AMBULATORY_CARE_PROVIDER_SITE_OTHER): Payer: Medicaid Other | Admitting: General Practice

## 2020-10-11 ENCOUNTER — Other Ambulatory Visit: Payer: Self-pay

## 2020-10-11 VITALS — BP 111/68 | HR 84 | Ht 60.0 in | Wt 155.0 lb

## 2020-10-11 DIAGNOSIS — N898 Other specified noninflammatory disorders of vagina: Secondary | ICD-10-CM | POA: Insufficient documentation

## 2020-10-11 NOTE — Progress Notes (Signed)
Patient was assessed and managed by nursing staff during this encounter. I have reviewed the chart and agree with the documentation and plan.   Jaynie Collins, MD 10/11/2020 2:48 PM

## 2020-10-11 NOTE — Progress Notes (Signed)
Patient reports white vaginal discharge with fishy odor x 4 days. She states she was treated for a yeast infection several weeks ago with two doses of Diflucan and clotrimazole cream but still continues to have symptoms. She is worried she still has a yeast infection and maybe BV. Patient denies itching. Patient reports being tested for STIs last month at [facility]. She was instructed in self swab & specimen was collected. Discussed results will be available in mychart within 24-48 hours and she will be contacted with any abnormal results. Patient verbalized understanding.  Chase Caller RN BSN 10/11/20

## 2020-10-12 ENCOUNTER — Other Ambulatory Visit: Payer: Self-pay

## 2020-10-12 ENCOUNTER — Other Ambulatory Visit: Payer: Self-pay | Admitting: Obstetrics & Gynecology

## 2020-10-12 DIAGNOSIS — B9689 Other specified bacterial agents as the cause of diseases classified elsewhere: Secondary | ICD-10-CM

## 2020-10-12 DIAGNOSIS — N760 Acute vaginitis: Secondary | ICD-10-CM

## 2020-10-12 LAB — CERVICOVAGINAL ANCILLARY ONLY
Bacterial Vaginitis (gardnerella): POSITIVE — AB
Candida Glabrata: NEGATIVE
Candida Vaginitis: NEGATIVE
Comment: NEGATIVE
Comment: NEGATIVE
Comment: NEGATIVE
Comment: NEGATIVE
Trichomonas: NEGATIVE

## 2020-10-12 MED ORDER — METRONIDAZOLE 500 MG PO TABS: 500.0000 mg | ORAL_TABLET | Freq: Two times a day (BID) | ORAL | 0 refills | Status: AC

## 2020-10-18 ENCOUNTER — Other Ambulatory Visit: Payer: Self-pay

## 2020-10-18 MED ORDER — METRONIDAZOLE 0.75 % VA GEL: 1.0000 | Freq: Two times a day (BID) | VAGINAL | 0 refills | Status: DC

## 2020-10-18 NOTE — Telephone Encounter (Signed)
Left message stating that gel has been e-prescribed so she can take that instead of the tablets as she requested.   Leonette Nutting  10/18/20

## 2021-01-02 ENCOUNTER — Telehealth: Payer: Self-pay | Admitting: Family Medicine

## 2021-01-02 NOTE — Telephone Encounter (Signed)
Call placed back to pt. Spoke with pt.. Pt states feeling tired more lately and wanting to get some labs drawn. Pt advised over due for annual exam. Pt agreeable to schedule Annual exam with provider. Pt scheduled on 11/1 at 2:55pm with Camelia Eng, NP. Pt verbalized understanding to date and time of appt.   Pt states also having yeast symptoms and would like to come for self swab. Pt scheduled for nurse visit on 10/13 at 1:30pm. Pt verbalized understanding to date and time of appt.   Judeth Cornfield, RN

## 2021-01-02 NOTE — Telephone Encounter (Signed)
Patient sate she has been super tired and she want to get some labs drawn

## 2021-01-03 ENCOUNTER — Ambulatory Visit (INDEPENDENT_AMBULATORY_CARE_PROVIDER_SITE_OTHER): Payer: Medicaid Other

## 2021-01-03 ENCOUNTER — Other Ambulatory Visit (HOSPITAL_COMMUNITY)
Admission: RE | Admit: 2021-01-03 | Discharge: 2021-01-03 | Disposition: A | Discharge status: Home / Self Care | Payer: Medicaid Other | Source: Ambulatory Visit | Attending: Family Medicine | Admitting: Family Medicine

## 2021-01-03 ENCOUNTER — Other Ambulatory Visit: Payer: Self-pay

## 2021-01-03 VITALS — BP 131/67 | HR 86 | Wt 154.4 lb

## 2021-01-03 DIAGNOSIS — N898 Other specified noninflammatory disorders of vagina: Secondary | ICD-10-CM | POA: Diagnosis present

## 2021-01-03 MED ORDER — FLUCONAZOLE 150 MG PO TABS: 150.0000 mg | ORAL_TABLET | Freq: Once | ORAL | 0 refills | Status: AC

## 2021-01-03 NOTE — Progress Notes (Signed)
Here today with complaint of vaginal itching. Self swab instructions given and specimen obtained. Diflucan 150 mg tablet ordered per protocol. Explained we will contact patient with any abnormal results. Pt has no other concerns today. Will return for well woman/annual visit on 01/22/21.  Fleet Contras RN 01/03/21

## 2021-01-04 LAB — CERVICOVAGINAL ANCILLARY ONLY
Bacterial Vaginitis (gardnerella): POSITIVE — AB
Candida Glabrata: NEGATIVE
Candida Vaginitis: POSITIVE — AB
Chlamydia: NEGATIVE
Comment: NEGATIVE
Comment: NEGATIVE
Comment: NEGATIVE
Comment: NEGATIVE
Comment: NEGATIVE
Comment: NORMAL
Neisseria Gonorrhea: NEGATIVE
Trichomonas: NEGATIVE

## 2021-01-07 ENCOUNTER — Telehealth: Payer: Self-pay | Admitting: *Deleted

## 2021-01-07 DIAGNOSIS — B9689 Other specified bacterial agents as the cause of diseases classified elsewhere: Secondary | ICD-10-CM

## 2021-01-07 DIAGNOSIS — N760 Acute vaginitis: Secondary | ICD-10-CM

## 2021-01-07 MED ORDER — METRONIDAZOLE 500 MG PO TABS
500.0000 mg | ORAL_TABLET | Freq: Two times a day (BID) | ORAL | 0 refills | Status: DC
Start: 2021-01-07 — End: 2021-01-29

## 2021-01-07 NOTE — Telephone Encounter (Signed)
Erin Jacobson left a voice message today she had appointment last week and was treated for yeast but states results now show she has BV . She request treatment. I called her and informed her I can send in Flagyl again but since she had BV 3 months she needs to be seen by a provider per our protocol . She states she already has yearly scheduled and I confirmed.  Bowie Doiron,RN

## 2021-01-14 NOTE — Progress Notes (Signed)
Attestation of Attending Supervision of clinical support staff: I agree with the care provided to this patient and was available for any consultation.  I have reviewed the RN's note and chart. I was available for consult and to see the patient if needed.   Lavante Toso Niles Taia Bramlett, MD, MPH, ABFM Attending Physician Faculty Practice- [facility]  

## 2021-01-22 ENCOUNTER — Ambulatory Visit: Payer: Medicaid Other | Admitting: Nurse Practitioner

## 2021-01-29 ENCOUNTER — Other Ambulatory Visit: Payer: Self-pay

## 2021-01-29 ENCOUNTER — Ambulatory Visit (INDEPENDENT_AMBULATORY_CARE_PROVIDER_SITE_OTHER): Payer: Medicaid Other | Admitting: Nurse Practitioner

## 2021-01-29 ENCOUNTER — Encounter: Payer: Self-pay | Admitting: Nurse Practitioner

## 2021-01-29 ENCOUNTER — Other Ambulatory Visit (HOSPITAL_COMMUNITY)
Admission: RE | Admit: 2021-01-29 | Discharge: 2021-01-29 | Disposition: A | Discharge status: Home / Self Care | Payer: Medicaid Other | Source: Ambulatory Visit | Attending: Nurse Practitioner | Admitting: Nurse Practitioner

## 2021-01-29 VITALS — BP 124/75 | HR 74 | Wt 151.6 lb

## 2021-01-29 DIAGNOSIS — Z01419 Encounter for gynecological examination (general) (routine) without abnormal findings: Secondary | ICD-10-CM

## 2021-01-29 DIAGNOSIS — R5383 Other fatigue: Secondary | ICD-10-CM

## 2021-01-29 DIAGNOSIS — F419 Anxiety disorder, unspecified: Secondary | ICD-10-CM

## 2021-01-29 DIAGNOSIS — F32A Depression, unspecified: Secondary | ICD-10-CM | POA: Diagnosis not present

## 2021-01-29 NOTE — Progress Notes (Signed)
GYNECOLOGY ANNUAL PREVENTATIVE CARE ENCOUNTER NOTE  Subjective:   Erin Jacobson is a 30 y.o. [phone] female here for a routine annual gynecologic exam.  Current complaints: having trouble with depression and anxiety.  She also reports she does not have any energy.  Does not want to see BH in our office - she has before and did not think it was helpful.  She has tried medication that was not helpful.  She has not called for therapy or other help.  .   Denies abnormal vaginal bleeding, discharge, pelvic pain, problems with intercourse or other gynecologic concerns.    Gynecologic History Patient's last menstrual period was 01/09/2021 (exact date). Contraception: none - not having intercourse at this time Last Pap: 09-28-17. Results were: normal   Obstetric History OB History  Gravida Para Term Preterm AB Living  3 3 2 1   3   SAB IAB Ectopic Multiple Live Births        0 3    # Outcome Date GA Lbr Len/2nd Weight Sex Delivery Anes PTL Lv  3 Preterm 06/14/18 [redacted]w[redacted]d  7 lb 1.8 oz (3.225 kg) M VBAC None  LIV  2 Term 06/11/15 [redacted]w[redacted]d 30:55 / 04:20 6 lb 10.4 oz (3.015 kg) M CS-LTranv EPI  LIV  1 Term 12/14/09 [redacted]w[redacted]d  5 lb 3 oz (2.353 kg) M Vag-Spont   LIV    Past Medical History:  Diagnosis Date   Complication of anesthesia    Depression    on meds, doing ok   DVT of upper extremity (deep vein thrombosis) (HCC)    GERD (gastroesophageal reflux disease)    no current meds for reflux   Infection    UTI   Kidney stone    stent and foley placed to pass kidney stones 05/05/2014   PONV (postoperative nausea and vomiting)    Trichomoniasis     Past Surgical History:  Procedure Laterality Date   APPENDECTOMY  04/01/2004   ex. lap.   CESAREAN SECTION N/A 06/11/2015   Procedure: CESAREAN SECTION;  Surgeon: 06/13/2015, MD;  Location: [facility];  Service: Obstetrics;  Laterality: N/A;   CYSTOSCOPY WITH RETROGRADE PYELOGRAM, URETEROSCOPY AND STENT PLACEMENT Left 05/05/2014   Procedure: LEFT   URETEROSCOPY,  RETROGRADE PYELOGRAM,  AND STENT PLACEMENT, STONE REMOVAL;  Surgeon: 07/04/2014, MD;  Location: [facility];  Service: Urology;  Laterality: Left;   HOLMIUM LASER APPLICATION Left 05/05/2014   Procedure: HOLMIUM LASER APPLICATION;  Surgeon: 07/04/2014, MD;  Location: [facility];  Service: Urology;  Laterality: Left;   KNEE SURGERY     LAPAROSCOPIC TUBAL LIGATION Bilateral 09/29/2018   Procedure: LAPAROSCOPIC TUBAL LIGATION WITH FILSHIE CLIPS;  Surgeon: 11/30/2018, MD;  Location: [facility];  Service: Gynecology;  Laterality: Bilateral;    Current Outpatient Medications on File Prior to Visit  Medication Sig Dispense Refill   [DISCONTINUED] albuterol (PROVENTIL HFA;VENTOLIN HFA) 108 (90 Base) MCG/ACT inhaler Inhale 2 puffs into the lungs every 6 (six) hours as needed for wheezing or shortness of breath. (Patient not taking: Reported on 04/12/2020) 1 Inhaler 2   [DISCONTINUED] azelastine (ASTELIN) 0.1 % nasal spray Place 2 sprays into both nostrils 2 (two) times daily. (Patient not taking: No sig reported) 30 mL 0   No current facility-administered medications on file prior to visit.    No Known Allergies  Social History   Socioeconomic History   Marital status: Single    Spouse name: Not on  file   Number of children: Not on file   Years of education: Not on file   Highest education level: Not on file  Occupational History   Not on file  Tobacco Use   Smoking status: Some Days    Types: Cigars    Last attempt to quit: 09/24/2014    Years since quitting: 6.3   Smokeless tobacco: Never  Vaping Use   Vaping Use: Never used  Substance and Sexual Activity   Alcohol use: Yes   Drug use: No   Sexual activity: Yes    Birth control/protection: None  Other Topics Concern   Not on file  Social History Narrative   Not on file   Social Determinants of Health   Financial Resource Strain: Not on file  Food Insecurity: No Food Insecurity   Worried  About Running Out of Food in the Last Year: Never true   Ran Out of Food in the Last Year: Never true  Transportation Needs: No Transportation Needs   Lack of Transportation (Medical): No   Lack of Transportation (Non-Medical): No  Physical Activity: Not on file  Stress: Not on file  Social Connections: Not on file  Intimate Partner Violence: Not on file    Family History  Problem Relation Age of Onset   Anesthesia problems Mother        post-op N/V, hard to wake up   Arthritis Mother    Cancer Maternal Grandfather     The following portions of the patient's history were reviewed and updated as appropriate: allergies, current medications, past family history, past medical history, past social history, past surgical history and problem list.  Review of Systems Pertinent items noted in HPI and remainder of comprehensive ROS otherwise negative.   Objective:  BP 124/75   Pulse 74   Wt 151 lb 9.6 oz (68.8 kg)   LMP 01/09/2021 (Exact Date)   BMI 29.61 kg/m  CONSTITUTIONAL: Well-developed, well-nourished female in no acute distress.  HENT:  Normocephalic, atraumatic, External right and left ear normal.  EYES: Conjunctivae and EOM are normal. Pupils are equal, round.  No scleral icterus.  NECK: Normal range of motion, supple, no masses.  Normal thyroid.  SKIN: Skin is warm and dry. No rash noted. Not diaphoretic. No erythema. No pallor. NEUROLOGIC: Alert and oriented to person, place, and time. Normal reflexes, muscle tone coordination. No cranial nerve deficit noted. PSYCHIATRIC: Normal mood and affect. Normal behavior. Normal judgment and thought content. CARDIOVASCULAR: Normal heart rate noted, regular rhythm RESPIRATORY: Clear to auscultation bilaterally. Effort and breath sounds normal, no problems with respiration noted. BREASTS: Symmetric in size. No masses, skin changes, nipple drainage, or lymphadenopathy. ABDOMEN: Soft, no distention noted.  No tenderness, rebound or  guarding.  PELVIC: Normal appearing external genitalia; normal appearing vaginal mucosa and cervix.  No abnormal discharge noted.  Pap smear obtained.  Normal uterine size, no other palpable masses, no uterine or adnexal tenderness.  Difficult to visualize cervix - on palpation, cervix is located behind pubic symphysi. MUSCULOSKELETAL: Normal range of motion. No tenderness.  No cyanosis, clubbing, or edema.    Assessment and Plan:  1. Well woman exam with routine gynecological exam Discussed fatigue and depression - will take labs to see if these are related problems and if there is a physical source aggravating the depression. Patient agrees not to harm herself and to take the first step of seeking mental health resources in the next 1-2 days.  She lives with her child  and her mother so she does have childcare to be able to seek care at a 24 hour facility.  Reviewed the location of [facility].  - Cytology - PAP( [place]) - Cervicovaginal ancillary only( [place]) - Hepatitis B Surface AntiGEN - Hepatitis C Antibody - CBC - TSH - HIV antibody (with reflex) - RPR  Will follow up results of pap smear and manage accordingly. Routine preventative health maintenance measures emphasized. Please refer to After Visit Summary for other counseling recommendations.    Nolene Bernheim, RN, MSN, NP-BC Nurse Practitioner, [facility]

## 2021-01-29 NOTE — Progress Notes (Signed)
PHQ-9 and GAD-7 positive today. Endorses SI and thoughts of harm. Has made plan for harm within the last 2 weeks. Does not have any plan to harm herself today. Asher Muir [facility] is not available at this time for visit.   Reports history of Zoloft, did not feel it was helpful. [facility] Asher Muir has seen patient and recommended pt follow up with [facility]. Explained to pt that this urgent care is available 24-7. Also reviewed area emergency departments and suicide help line that will print out on AVS.  Fleet Contras RN 01/29/21

## 2021-01-30 LAB — HEPATITIS B SURFACE ANTIGEN: Hepatitis B Surface Ag: NEGATIVE

## 2021-01-30 LAB — CBC
Hematocrit: 37.7 % (ref 34.0–46.6)
Hemoglobin: 12.7 g/dL (ref 11.1–15.9)
MCH: 28.5 pg (ref 26.6–33.0)
MCHC: 33.7 g/dL (ref 31.5–35.7)
MCV: 85 fL (ref 79–97)
Platelets: 349 10*3/uL (ref 150–450)
RBC: 4.46 x10E6/uL (ref 3.77–5.28)
RDW: 14.5 % (ref 11.7–15.4)
WBC: 8.5 10*3/uL (ref 3.4–10.8)

## 2021-01-30 LAB — TSH: TSH: 1.67 u[IU]/mL (ref 0.450–4.500)

## 2021-01-30 LAB — RPR: RPR Ser Ql: NONREACTIVE

## 2021-01-30 LAB — HEPATITIS C ANTIBODY: Hep C Virus Ab: <0.1 s/co ratio (ref 0.0–0.9)

## 2021-01-30 LAB — HIV ANTIBODY (ROUTINE TESTING W REFLEX): HIV Screen 4th Generation wRfx: NONREACTIVE

## 2021-01-31 ENCOUNTER — Telehealth: Payer: Self-pay

## 2021-01-31 LAB — CYTOLOGY - PAP
Adequacy: ABSENT
Diagnosis: NEGATIVE

## 2021-01-31 NOTE — Telephone Encounter (Signed)
Call placed to pt. Pt did not answer. Will send mychart message to pt.  Erin Jacobson

## 2021-01-31 NOTE — Telephone Encounter (Signed)
-----   Message from Currie Paris, NP sent at 01/30/2021 11:08 PM EST ----- Please call patient,  Her labs do not show any physical reason for her being tired.  It may be related to depression.  Please make sure she went [facility] for [facility].

## 2021-02-01 ENCOUNTER — Encounter: Payer: Self-pay | Admitting: Nurse Practitioner

## 2021-02-01 DIAGNOSIS — R87616 Satisfactory cervical smear but lacking transformation zone: Secondary | ICD-10-CM | POA: Insufficient documentation

## 2021-02-01 LAB — CERVICOVAGINAL ANCILLARY ONLY
Bacterial Vaginitis (gardnerella): POSITIVE — AB
Chlamydia: NEGATIVE
Comment: NEGATIVE
Comment: NEGATIVE
Comment: NORMAL
Neisseria Gonorrhea: NEGATIVE

## 2021-02-03 MED ORDER — METRONIDAZOLE 500 MG PO TABS: 500.0000 mg | ORAL_TABLET | Freq: Two times a day (BID) | ORAL | 0 refills | Status: DC

## 2021-02-03 MED ORDER — METRONIDAZOLE 0.75 % VA GEL
1.0000 | Freq: Every day | VAGINAL | 0 refills | Status: AC
Start: 2021-02-03 — End: 2021-02-08

## 2021-02-03 NOTE — Addendum Note (Signed)
Addended by: Currie Paris on: 02/03/2021 10:48 AM   Modules accepted: Orders

## 2021-04-18 ENCOUNTER — Other Ambulatory Visit: Payer: Self-pay

## 2021-04-18 MED ORDER — FLUCONAZOLE 150 MG PO TABS: 150.0000 mg | ORAL_TABLET | Freq: Once | ORAL | 0 refills | Status: AC

## 2021-04-18 NOTE — Telephone Encounter (Signed)
Diflucan e-prescribed per standing protocol.    Erin Jacobson  04/18/21

## 2021-04-30 ENCOUNTER — Other Ambulatory Visit: Payer: Self-pay

## 2021-04-30 DIAGNOSIS — N898 Other specified noninflammatory disorders of vagina: Secondary | ICD-10-CM

## 2021-04-30 DIAGNOSIS — Z792 Long term (current) use of antibiotics: Secondary | ICD-10-CM

## 2021-04-30 MED ORDER — METRONIDAZOLE 0.75 % VA GEL: 1.0000 | Freq: Every day | VAGINAL | 0 refills | Status: AC

## 2021-04-30 MED ORDER — FLUCONAZOLE 150 MG PO TABS: 150.0000 mg | ORAL_TABLET | Freq: Every day | ORAL | 0 refills | Status: DC

## 2021-05-01 ENCOUNTER — Ambulatory Visit: Payer: Medicaid Other

## 2021-05-07 ENCOUNTER — Ambulatory Visit: Payer: Medicaid Other

## 2021-05-15 ENCOUNTER — Ambulatory Visit (INDEPENDENT_AMBULATORY_CARE_PROVIDER_SITE_OTHER): Payer: Medicaid Other

## 2021-05-15 ENCOUNTER — Other Ambulatory Visit: Payer: Self-pay

## 2021-05-15 ENCOUNTER — Other Ambulatory Visit (HOSPITAL_COMMUNITY)
Admission: RE | Admit: 2021-05-15 | Discharge: 2021-05-15 | Disposition: A | Discharge status: Home / Self Care | Payer: Medicaid Other | Source: Ambulatory Visit | Attending: Family Medicine | Admitting: Family Medicine

## 2021-05-15 VITALS — BP 114/77 | HR 105 | Wt 152.6 lb

## 2021-05-15 DIAGNOSIS — N898 Other specified noninflammatory disorders of vagina: Secondary | ICD-10-CM | POA: Diagnosis not present

## 2021-05-15 NOTE — Progress Notes (Signed)
Patient here today with complaint of vaginal discharge that has an irregular texture. Patient denies any vaginal itching or burning with urination. I explained to patient how to collect a self swab. Self swab collected without issue. I informed patient we will notify her with any abnormal results. Patient verbalized understanding and denies any other questions.   Paulina Fusi, RN 05/15/21

## 2021-05-16 LAB — CERVICOVAGINAL ANCILLARY ONLY
Bacterial Vaginitis (gardnerella): NEGATIVE
Candida Glabrata: NEGATIVE
Candida Vaginitis: POSITIVE — AB
Chlamydia: NEGATIVE
Comment: NEGATIVE
Comment: NEGATIVE
Comment: NEGATIVE
Comment: NEGATIVE
Comment: NEGATIVE
Comment: NORMAL
Neisseria Gonorrhea: NEGATIVE
Trichomonas: NEGATIVE

## 2021-05-19 ENCOUNTER — Other Ambulatory Visit: Payer: Self-pay | Admitting: Certified Nurse Midwife

## 2021-05-19 DIAGNOSIS — B3731 Acute candidiasis of vulva and vagina: Secondary | ICD-10-CM

## 2021-05-19 MED ORDER — FLUCONAZOLE 150 MG PO TABS: 150.0000 mg | ORAL_TABLET | Freq: Every day | ORAL | 1 refills | Status: DC

## 2021-07-02 ENCOUNTER — Other Ambulatory Visit: Payer: Self-pay

## 2021-07-02 MED ORDER — FLUCONAZOLE 150 MG PO TABS: 150.0000 mg | ORAL_TABLET | Freq: Once | ORAL | 0 refills | Status: AC

## 2021-07-02 MED ORDER — METRONIDAZOLE 500 MG PO TABS: 500.0000 mg | ORAL_TABLET | Freq: Two times a day (BID) | ORAL | 0 refills | Status: DC

## 2021-07-02 NOTE — Progress Notes (Signed)
Patient experiencing itching, thick white discharge, reports fishy odor. RX sent in per protocol for Flagyl and Diflucan. Patient notified via MyChart ? ? ?Wynona Canes, CMA ?

## 2021-07-08 MED ORDER — METRONIDAZOLE 0.75 % VA GEL: 1.0000 | Freq: Two times a day (BID) | VAGINAL | 0 refills | Status: DC

## 2021-08-30 ENCOUNTER — Other Ambulatory Visit: Payer: Self-pay | Admitting: Physician Assistant

## 2021-08-30 DIAGNOSIS — N644 Mastodynia: Secondary | ICD-10-CM

## 2021-09-03 ENCOUNTER — Other Ambulatory Visit: Payer: Medicaid Other

## 2021-09-11 ENCOUNTER — Other Ambulatory Visit: Payer: Medicaid Other

## 2021-10-24 ENCOUNTER — Other Ambulatory Visit (HOSPITAL_COMMUNITY)
Admission: RE | Admit: 2021-10-24 | Discharge: 2021-10-24 | Disposition: A | Discharge status: Home / Self Care | Payer: Medicaid Other | Source: Ambulatory Visit | Attending: Family Medicine | Admitting: Family Medicine

## 2021-10-24 ENCOUNTER — Other Ambulatory Visit: Payer: Self-pay

## 2021-10-24 ENCOUNTER — Ambulatory Visit (INDEPENDENT_AMBULATORY_CARE_PROVIDER_SITE_OTHER): Payer: Medicaid Other | Admitting: General Practice

## 2021-10-24 VITALS — BP 128/75 | HR 87 | Ht 60.0 in | Wt 157.0 lb

## 2021-10-24 DIAGNOSIS — N898 Other specified noninflammatory disorders of vagina: Secondary | ICD-10-CM | POA: Diagnosis present

## 2021-10-24 NOTE — Progress Notes (Signed)
Patient presents to office today requesting self swab due to noticing a yeast smell and thick white discharge x 2 days with some itching. She reports a history of yeast & BV infections. Patient was instructed in self swab & specimen collected. Advised results will be back in 24-48 hours and available via mychart with any needed prescriptions sent to her pharmacy. Patient verbalized understanding.   Chase Caller RN BSN 10/24/21

## 2021-10-25 LAB — CERVICOVAGINAL ANCILLARY ONLY
Bacterial Vaginitis (gardnerella): POSITIVE — AB
Candida Glabrata: NEGATIVE
Candida Vaginitis: NEGATIVE
Chlamydia: NEGATIVE
Comment: NEGATIVE
Comment: NEGATIVE
Comment: NEGATIVE
Comment: NEGATIVE
Comment: NEGATIVE
Comment: NORMAL
Neisseria Gonorrhea: NEGATIVE
Trichomonas: NEGATIVE

## 2021-10-25 MED ORDER — METRONIDAZOLE 500 MG PO TABS: 500.0000 mg | ORAL_TABLET | Freq: Two times a day (BID) | ORAL | 0 refills | Status: AC

## 2021-10-25 NOTE — Addendum Note (Signed)
Addended by: Reva Bores on: 10/25/2021 12:18 PM   Modules accepted: Orders

## 2021-11-22 ENCOUNTER — Ambulatory Visit
Admission: RE | Admit: 2021-11-22 | Discharge: 2021-11-22 | Disposition: A | Discharge status: Home / Self Care | Payer: Medicaid Other | Source: Ambulatory Visit | Attending: Physician Assistant | Admitting: Physician Assistant

## 2021-11-22 VITALS — BP 118/78 | HR 96 | Temp 98.3°F | Resp 16

## 2021-11-22 DIAGNOSIS — N898 Other specified noninflammatory disorders of vagina: Secondary | ICD-10-CM | POA: Diagnosis not present

## 2021-11-22 DIAGNOSIS — Z113 Encounter for screening for infections with a predominantly sexual mode of transmission: Secondary | ICD-10-CM | POA: Diagnosis present

## 2021-11-22 NOTE — ED Provider Notes (Signed)
[name]    CSN: [id number] Arrival date & time: 11/22/21  1004      History   Chief Complaint Chief Complaint  Patient presents with   SEXUALLY TRANSMITTED DISEASE    I've noticed a creamy white thick discharge and I have small bumps around my lips. - Entered by patient    HPI Erin Jacobson is a 31 y.o. female.   Patient here today for evaluation of vaginal discharge that is been ongoing the last 2 days.  She reports associated vaginal itching as well.  She would like screening for STDs as she is concerned her partner may have cheated.  She denies any known STD exposure.  She denies any genital lesions.  She denies risk of pregnancy, prior tubal ligation.   The history is provided by the patient.    Past Medical History:  Diagnosis Date   Complication of anesthesia    Depression    on meds, doing ok   DVT of upper extremity (deep vein thrombosis) (HCC)    GERD (gastroesophageal reflux disease)    no current meds for reflux   Infection    UTI   Kidney stone    stent and foley placed to pass kidney stones 05/05/2014   PONV (postoperative nausea and vomiting)    Trichomoniasis     Patient Active Problem List   Diagnosis Date Noted   Pap smear of cervix satisfactory but lacking transformation zone 02/01/2021   History of sexually transmitted disease 04/09/2018   Anxiety and depression 04/09/2018   History of cesarean delivery 03/12/2018   History of deep vein thrombosis (DVT) during pregnancy 03/12/2018    Past Surgical History:  Procedure Laterality Date   APPENDECTOMY  04/01/2004   ex. lap.   CESAREAN SECTION N/A 06/11/2015   Procedure: CESAREAN SECTION;  Surgeon: Marlow Baars, MD;  Location: [facility];  Service: Obstetrics;  Laterality: N/A;   CYSTOSCOPY WITH RETROGRADE PYELOGRAM, URETEROSCOPY AND STENT PLACEMENT Left 05/05/2014   Procedure: LEFT  URETEROSCOPY,  RETROGRADE PYELOGRAM,  AND STENT PLACEMENT, STONE REMOVAL;  Surgeon: Crist Fat, MD;   Location[facility];  Service: Urology;  Laterality: Left;   HOLMIUM LASER APPLICATION Left 05/05/2014   Procedure: HOLMIUM LASER APPLICATION;  Surgeon: Crist Fat, MD;  Location: [facility];  Service: Urology;  Laterality: Left;   KNEE SURGERY     LAPAROSCOPIC TUBAL LIGATION Bilateral 09/29/2018   Procedure: LAPAROSCOPIC TUBAL LIGATION WITH FILSHIE CLIPS;  Surgeon: Allie Bossier, MD;  Location: [facility];  Service: Gynecology;  Laterality: Bilateral;    OB History     Gravida  3   Para  3   Term  2   Preterm  1   AB      Living  3      SAB      IAB      Ectopic      Multiple  0   Live Births  3            Home Medications    Prior to Admission medications   Medication Sig Start Date End Date Taking? Authorizing Provider  albuterol (PROVENTIL HFA;VENTOLIN HFA) 108 (90 Base) MCG/ACT inhaler Inhale 2 puffs into the lungs every 6 (six) hours as needed for wheezing or shortness of breath. Patient not taking: Reported on 04/12/2020 05/28/18 04/20/20  Katrinka Blazing, [place], CNM  azelastine (ASTELIN) 0.1 % nasal spray Place 2 sprays into both nostrils 2 (two) times daily.  Patient not taking: No sig reported 08/07/19 04/20/20  Belinda Fisher, PA-C    Family History Family History  Problem Relation Age of Onset   Anesthesia problems Mother        post-op N/V, hard to wake up   Arthritis Mother    Cancer Maternal Grandfather     Social History Social History   Tobacco Use   Smoking status: Some Days    Types: Cigars    Last attempt to quit: 09/24/2014    Years since quitting: 7.1   Smokeless tobacco: Never  Vaping Use   Vaping Use: Never used  Substance Use Topics   Alcohol use: Yes   Drug use: No     Allergies   Patient has no known allergies.   Review of Systems Review of Systems  Constitutional:  Negative for chills and fever.  Eyes:  Negative for discharge and redness.  Gastrointestinal:  Negative for nausea and vomiting.  Genitourinary:   Positive for vaginal discharge. Negative for dysuria, genital sores and vaginal bleeding.     Physical Exam Triage Vital Signs ED Triage Vitals  Enc Vitals Group     BP 11/22/21 1016 118/78     Pulse Rate 11/22/21 1016 96     Resp 11/22/21 1016 16     Temp 11/22/21 1016 98.3 F (36.8 C)     Temp Source 11/22/21 1016 Oral     SpO2 11/22/21 1016 94 %     Weight --      Height --      Head Circumference --      Peak Flow --      Pain Score 11/22/21 1017 0     Pain Loc --      Pain Edu? --      Excl. in GC? --    No data found.  Updated Vital Signs BP 118/78 (BP Location: Right Arm)   Pulse 96   Temp 98.3 F (36.8 C) (Oral)   Resp 16   LMP 10/28/2021   SpO2 94%   Physical Exam Vitals and nursing note reviewed.  Constitutional:      General: She is not in acute distress.    Appearance: Normal appearance. She is not ill-appearing.  HENT:     Head: Normocephalic and atraumatic.  Eyes:     Conjunctiva/sclera: Conjunctivae normal.  Cardiovascular:     Rate and Rhythm: Normal rate.  Pulmonary:     Effort: Pulmonary effort is normal.  Neurological:     Mental Status: She is alert.  Psychiatric:        Mood and Affect: Mood normal.        Behavior: Behavior normal.        Thought Content: Thought content normal.      UC Treatments / Results  Labs (all labs ordered are listed, but only abnormal results are displayed) Labs Reviewed  RPR  HEPATITIS PANEL, ACUTE  HIV ANTIBODY (ROUTINE TESTING W REFLEX)  CERVICOVAGINAL ANCILLARY ONLY    EKG   Radiology No results found.  Procedures Procedures (including critical care time)  Medications Ordered in UC Medications - No data to display  Initial Impression / Assessment and Plan / UC Course  I have reviewed the triage vital signs and the nursing notes.  Pertinent labs & imaging results that were available during my care of the patient were reviewed by me and considered in my medical decision making (see  chart for details).    STD  screening ordered as requested as well as BV and yeast screening.  Will await results further recommendation but encouraged follow-up with any concerns.  Final Clinical Impressions(s) / UC Diagnoses   Final diagnoses:  Screen for STD (sexually transmitted disease)  Vaginal discharge   Discharge Instructions   None    ED Prescriptions   None    PDMP not reviewed this encounter.   Tomi Bamberger, PA-C 11/22/21 1150

## 2021-11-22 NOTE — ED Triage Notes (Signed)
Pt states white vaginal discharge and itching for the past 2 days.  Also wants to be tested for STD's because she thinks her partner cheated on her.

## 2021-11-23 LAB — RPR: RPR Ser Ql: NONREACTIVE

## 2021-11-23 LAB — HIV ANTIBODY (ROUTINE TESTING W REFLEX): HIV Screen 4th Generation wRfx: NONREACTIVE

## 2021-11-26 LAB — CERVICOVAGINAL ANCILLARY ONLY
Bacterial Vaginitis (gardnerella): POSITIVE — AB
Candida Glabrata: NEGATIVE
Candida Vaginitis: POSITIVE — AB
Chlamydia: NEGATIVE
Comment: NEGATIVE
Comment: NEGATIVE
Comment: NEGATIVE
Comment: NEGATIVE
Comment: NEGATIVE
Comment: NORMAL
Neisseria Gonorrhea: NEGATIVE
Trichomonas: NEGATIVE

## 2021-11-27 ENCOUNTER — Telehealth (HOSPITAL_COMMUNITY): Payer: Self-pay | Admitting: Emergency Medicine

## 2021-11-27 MED ORDER — METRONIDAZOLE 500 MG PO TABS: 500.0000 mg | ORAL_TABLET | Freq: Two times a day (BID) | ORAL | 0 refills | Status: DC

## 2021-11-27 MED ORDER — FLUCONAZOLE 150 MG PO TABS: 150.0000 mg | ORAL_TABLET | Freq: Once | ORAL | 0 refills | Status: AC

## 2022-01-07 ENCOUNTER — Encounter: Payer: Self-pay | Admitting: *Deleted

## 2022-01-07 ENCOUNTER — Ambulatory Visit (INDEPENDENT_AMBULATORY_CARE_PROVIDER_SITE_OTHER): Payer: Medicaid Other | Admitting: *Deleted

## 2022-01-07 ENCOUNTER — Other Ambulatory Visit (HOSPITAL_COMMUNITY)
Admission: RE | Admit: 2022-01-07 | Discharge: 2022-01-07 | Disposition: A | Discharge status: Home / Self Care | Payer: Medicaid Other | Source: Ambulatory Visit | Attending: Family Medicine | Admitting: Family Medicine

## 2022-01-07 ENCOUNTER — Other Ambulatory Visit: Payer: Self-pay

## 2022-01-07 VITALS — BP 109/69 | HR 87 | Ht 60.0 in | Wt 155.0 lb

## 2022-01-07 DIAGNOSIS — N898 Other specified noninflammatory disorders of vagina: Secondary | ICD-10-CM | POA: Insufficient documentation

## 2022-01-07 NOTE — Progress Notes (Signed)
Here for nurse visit for self swab. C/o whitish vaginal discharge and fishy odor for about a week. States she thinks she has BV and knows she has to have test to be treated. Has had BV 3 times in last year. Self swab obtained. Explained will be contacted if positive. We discussed if she continues to have recurrent BV there are other treatments. She voices understanding. Staci Acosta

## 2022-01-08 LAB — CERVICOVAGINAL ANCILLARY ONLY
Bacterial Vaginitis (gardnerella): POSITIVE — AB
Candida Glabrata: NEGATIVE
Candida Vaginitis: NEGATIVE
Comment: NEGATIVE
Comment: NEGATIVE
Comment: NEGATIVE

## 2022-01-09 ENCOUNTER — Other Ambulatory Visit: Payer: Self-pay

## 2022-01-09 ENCOUNTER — Other Ambulatory Visit: Payer: Self-pay | Admitting: Advanced Practice Midwife

## 2022-01-09 DIAGNOSIS — B9689 Other specified bacterial agents as the cause of diseases classified elsewhere: Secondary | ICD-10-CM

## 2022-01-09 DIAGNOSIS — N760 Acute vaginitis: Secondary | ICD-10-CM

## 2022-01-09 MED ORDER — TINIDAZOLE 500 MG PO TABS: 2.0000 g | ORAL_TABLET | Freq: Every day | ORAL | 0 refills | Status: AC

## 2022-01-14 MED ORDER — TINIDAZOLE 500 MG PO TABS: 2.0000 g | ORAL_TABLET | Freq: Every day | ORAL | 0 refills | Status: DC

## 2022-01-28 ENCOUNTER — Telehealth: Payer: Self-pay | Admitting: Family Medicine

## 2022-01-28 DIAGNOSIS — B3731 Acute candidiasis of vulva and vagina: Secondary | ICD-10-CM

## 2022-01-28 MED ORDER — FLUCONAZOLE 150 MG PO TABS: 150.0000 mg | ORAL_TABLET | Freq: Once | ORAL | 0 refills | Status: AC

## 2022-01-28 NOTE — Telephone Encounter (Signed)
Patient would like a call back regarding her previous visit

## 2022-01-28 NOTE — Telephone Encounter (Signed)
Messaged pt via MyChart. Sent pt medication per protocol.

## 2022-01-29 ENCOUNTER — Ambulatory Visit: Payer: Medicaid Other

## 2022-02-01 ENCOUNTER — Ambulatory Visit
Admission: RE | Admit: 2022-02-01 | Discharge: 2022-02-01 | Disposition: A | Discharge status: Home / Self Care | Payer: Medicaid Other | Source: Ambulatory Visit | Attending: Urgent Care | Admitting: Urgent Care

## 2022-02-01 VITALS — BP 131/96 | HR 89 | Temp 97.7°F | Resp 16

## 2022-02-01 DIAGNOSIS — B9689 Other specified bacterial agents as the cause of diseases classified elsewhere: Secondary | ICD-10-CM | POA: Diagnosis present

## 2022-02-01 DIAGNOSIS — N760 Acute vaginitis: Secondary | ICD-10-CM

## 2022-02-01 MED ORDER — FLUCONAZOLE 150 MG PO TABS: 150.0000 mg | ORAL_TABLET | ORAL | 0 refills | Status: DC

## 2022-02-01 MED ORDER — METRONIDAZOLE 0.75 % VA GEL: 1.0000 | Freq: Two times a day (BID) | VAGINAL | 0 refills | Status: AC

## 2022-02-01 NOTE — ED Provider Notes (Signed)
[facility]  Note:  This document was prepared using Conservation officer, historic buildings and may include unintentional dictation errors.  MRN: [id number] DOB: April 09, 1990  Subjective:   Erin Jacobson is a 31 y.o. female presenting for suspected recurrent bacterial vaginosis.  Patient has a history of this and has been more consistent in the past few months.  This is related to sex with a new partner.  She is working with a gynecologist but would like to get treatment for this current episode.  Last episode was in mid October and was treated with Tindamax.  She states that helped some but did not clear all the way.  Has previously used metronidazole successfully but hates the way that the tablet taste.  She has done better with MetroGel. Denies fever, n/v, abdominal pain, pelvic pain, rashes, dysuria, urinary frequency, hematuria.  Would also like coverage for secondary yeast infection.  She did not pick up the prescription that was sent by her PCP/gynecologist office.   No current facility-administered medications for this encounter.  Current Outpatient Medications:    metroNIDAZOLE (FLAGYL) 500 MG tablet, Take 1 tablet (500 mg total) by mouth 2 (two) times daily., Disp: 14 tablet, Rfl: 0   tinidazole (TINDAMAX) 500 MG tablet, Take 4 tablets (2,000 mg total) by mouth daily with breakfast., Disp: 8 tablet, Rfl: 0   No Known Allergies  Past Medical History:  Diagnosis Date   Complication of anesthesia    Depression    on meds, doing ok   DVT of upper extremity (deep vein thrombosis) (HCC)    GERD (gastroesophageal reflux disease)    no current meds for reflux   Infection    UTI   Kidney stone    stent and foley placed to pass kidney stones 05/05/2014   PONV (postoperative nausea and vomiting)    Trichomoniasis      Past Surgical History:  Procedure Laterality Date   APPENDECTOMY  04/01/2004   ex. lap.   CESAREAN SECTION N/A 06/11/2015   Procedure: CESAREAN  SECTION;  Surgeon: Marlow Baars, MD;  Location: [facility];  Service: Obstetrics;  Laterality: N/A;   CYSTOSCOPY WITH RETROGRADE PYELOGRAM, URETEROSCOPY AND STENT PLACEMENT Left 05/05/2014   Procedure: LEFT  URETEROSCOPY,  RETROGRADE PYELOGRAM,  AND STENT PLACEMENT, STONE REMOVAL;  Surgeon: Crist Fat, MD;  Location[facility];  Service: Urology;  Laterality: Left;   HOLMIUM LASER APPLICATION Left 05/05/2014   Procedure: HOLMIUM LASER APPLICATION;  Surgeon: Crist Fat, MD;  [facility];  Service: Urology;  Laterality: Left;   KNEE SURGERY     LAPAROSCOPIC TUBAL LIGATION Bilateral 09/29/2018   Procedure: LAPAROSCOPIC TUBAL LIGATION WITH FILSHIE CLIPS;  Surgeon: Allie Bossier, MD;  Location: [facility];  Service: Gynecology;  Laterality: Bilateral;    Family History  Problem Relation Age of Onset   Anesthesia problems Mother        post-op N/V, hard to wake up   Arthritis Mother    Cancer Maternal Grandfather     Social History   Tobacco Use   Smoking status: Some Days    Types: Cigars    Last attempt to quit: 09/24/2014    Years since quitting: 7.3   Smokeless tobacco: Never  Vaping Use   Vaping Use: Never used  Substance Use Topics   Alcohol use: Yes   Drug use: No    ROS   Objective:   Vitals: BP (!) 131/96 (BP Location: Right  Arm)   Pulse 89   Temp 97.7 F (36.5 C) (Oral)   Resp 16   LMP 01/19/2022 (Approximate)   SpO2 99%   Physical Exam Constitutional:      General: She is not in acute distress.    Appearance: Normal appearance. She is well-developed. She is not ill-appearing, toxic-appearing or diaphoretic.  HENT:     Head: Normocephalic and atraumatic.     Nose: Nose normal.     Mouth/Throat:     Mouth: Mucous membranes are moist.  Eyes:     General: No scleral icterus.       Right eye: No discharge.        Left eye: No discharge.     Extraocular Movements: Extraocular movements intact.  Cardiovascular:     Rate and Rhythm:  Normal rate.  Pulmonary:     Effort: Pulmonary effort is normal.  Skin:    General: Skin is warm and dry.  Neurological:     General: No focal deficit present.     Mental Status: She is alert and oriented to person, place, and time.  Psychiatric:        Mood and Affect: Mood normal.        Behavior: Behavior normal.     Assessment and Plan :   PDMP not reviewed this encounter.  1. Bacterial vaginosis     I was agreeable and will be using metronidazole gel twice daily given her history of frequent BV infections.  If she cannot tolerate this, recommended backing off to once daily.  Use oral fluconazole for empiric treatment of yeast vaginitis.  Follow-up with PCP/gynecologist.  We will treat as appropriate based off of lab results otherwise. Counseled patient on potential for adverse effects with medications prescribed/recommended today, ER and return-to-clinic precautions discussed, patient verbalized understanding.    Wallis Bamberg, PA-C 02/01/22 1152

## 2022-02-01 NOTE — ED Triage Notes (Signed)
Patient presents to [facility] for vaginal discharge. States she was treated for BV 10/19. States her PCP sent her a prescription for yeast. No improvement in symptoms. Hx of BV/yeast.

## 2022-02-01 NOTE — Discharge Instructions (Addendum)
If the metronidazole does not work for the recurrent BV infection, then it may be reasonable to try clindamycin. Review this with your PCP and/or gynecologist.

## 2022-02-03 LAB — CERVICOVAGINAL ANCILLARY ONLY
Bacterial Vaginitis (gardnerella): POSITIVE — AB
Candida Glabrata: NEGATIVE
Candida Vaginitis: NEGATIVE
Chlamydia: NEGATIVE
Comment: NEGATIVE
Comment: NEGATIVE
Comment: NEGATIVE
Comment: NEGATIVE
Comment: NEGATIVE
Comment: NORMAL
Neisseria Gonorrhea: NEGATIVE
Trichomonas: NEGATIVE

## 2022-02-24 ENCOUNTER — Ambulatory Visit: Payer: Medicaid Other | Admitting: Obstetrics and Gynecology

## 2022-03-06 ENCOUNTER — Ambulatory Visit: Payer: Self-pay

## 2022-03-22 ENCOUNTER — Ambulatory Visit
Admission: RE | Admit: 2022-03-22 | Discharge: 2022-03-22 | Disposition: A | Discharge status: Home / Self Care | Payer: Medicaid Other | Source: Ambulatory Visit | Attending: Emergency Medicine | Admitting: Emergency Medicine

## 2022-03-22 VITALS — BP 130/87 | HR 91 | Temp 98.7°F | Resp 16

## 2022-03-22 DIAGNOSIS — N760 Acute vaginitis: Secondary | ICD-10-CM | POA: Insufficient documentation

## 2022-03-22 LAB — POCT URINALYSIS DIP (MANUAL ENTRY)
Bilirubin, UA: NEGATIVE
Blood, UA: NEGATIVE
Glucose, UA: NEGATIVE mg/dL
Leukocytes, UA: NEGATIVE
Nitrite, UA: NEGATIVE
Protein Ur, POC: NEGATIVE mg/dL
Spec Grav, UA: 1.025 (ref 1.010–1.025)
Urobilinogen, UA: 0.2 E.U./dL
pH, UA: 5.5 (ref 5.0–8.0)

## 2022-03-22 LAB — POCT URINE PREGNANCY: Preg Test, Ur: NEGATIVE

## 2022-03-22 MED ORDER — LIDOCAINE 5 % EX OINT: 1.0000 | TOPICAL_OINTMENT | CUTANEOUS | 0 refills | Status: DC | PRN

## 2022-03-22 NOTE — ED Triage Notes (Signed)
Pt c/o vaginal itching and discharge for a few days.   Home interventions: none

## 2022-03-22 NOTE — Discharge Instructions (Signed)
The results of your vaginal swab test which screens for BV, yeast, gonorrhea, chlamydia and trichomonas will be made posted to your MyChart account once it is complete.  This typically takes 2 to 4 days.  Please abstain from sexual intercourse of any kind, vaginal, oral or anal, until you have received the results of your STD testing.     If any of your results are abnormal, you will receive a phone call regarding treatment.  Prescriptions, if any are needed, will be provided for you at your pharmacy.     For comfort, while you are waiting for the result of your vaginal swab test, I have provided you with an external vaginal cream that you can apply 3-4 times daily for relief of vaginal itching and burning.  I recommend that you abstain from sexual intercourse, tampon use or any other other intravaginal activities while you are waiting on these results and possible treatment.   If you have not had complete resolution of your symptoms after completing any recommended treatment or if your symptoms worsen, please return for repeat evaluation.   Thank you for visiting urgent care today.  I appreciate the opportunity to participate in your care.

## 2022-03-22 NOTE — ED Provider Notes (Signed)
[facility]    CSN: [id number] Arrival date & time: 03/22/22  1410    HISTORY   Chief Complaint  Patient presents with   Vaginal Itching    Possible BV - Entered by patient   HPI Erin Jacobson is a pleasant, 31 y.o. female who presents to urgent care today. Pt c/o vaginal itching and discharge for a few days, has not to anything to alleviate her symptoms.  Patient tested positive for bacterial vaginosis in November of this year.  Patient denies abnormal odor of urine, burning with urination, increased frequency of urination, flank pain, fever, chills, malaise, rigors, significant fatigue, dyspareunia, and possible exposure to STD.     The history is provided by the patient.   Past Medical History:  Diagnosis Date   Complication of anesthesia    Depression    on meds, doing ok   DVT of upper extremity (deep vein thrombosis) (HCC)    GERD (gastroesophageal reflux disease)    no current meds for reflux   Infection    UTI   Kidney stone    stent and foley placed to pass kidney stones 05/05/2014   PONV (postoperative nausea and vomiting)    Trichomoniasis    Patient Active Problem List   Diagnosis Date Noted   Pap smear of cervix satisfactory but lacking transformation zone 02/01/2021   History of sexually transmitted disease 04/09/2018   Anxiety and depression 04/09/2018   History of cesarean delivery 03/12/2018   History of deep vein thrombosis (DVT) during pregnancy 03/12/2018   Past Surgical History:  Procedure Laterality Date   APPENDECTOMY  04/01/2004   ex. lap.   CESAREAN SECTION N/A 06/11/2015   Procedure: CESAREAN SECTION;  Surgeon: Marlow Baars, MD;  Location: [facility];  Service: Obstetrics;  Laterality: N/A;   CYSTOSCOPY WITH RETROGRADE PYELOGRAM, URETEROSCOPY AND STENT PLACEMENT Left 05/05/2014   Procedure: LEFT  URETEROSCOPY,  RETROGRADE PYELOGRAM,  AND STENT PLACEMENT, STONE REMOVAL;  Surgeon: Crist Fat, MD;  Location: [facility];  Service:  Urology;  Laterality: Left;   HOLMIUM LASER APPLICATION Left 05/05/2014   Procedure: HOLMIUM LASER APPLICATION;  Surgeon: Crist Fat, MD;  Location: [facility];  Service: Urology;  Laterality: Left;   KNEE SURGERY     LAPAROSCOPIC TUBAL LIGATION Bilateral 09/29/2018   Procedure: LAPAROSCOPIC TUBAL LIGATION WITH FILSHIE CLIPS;  Surgeon: Allie Bossier, MD;  Location: [facility];  Service: Gynecology;  Laterality: Bilateral;   OB History     Gravida  3   Para  3   Term  2   Preterm  1   AB      Living  3      SAB      IAB      Ectopic      Multiple  0   Live Births  3          Home Medications    Prior to Admission medications   Medication Sig Start Date End Date Taking? Authorizing Provider  fluconazole (DIFLUCAN) 150 MG tablet Take 1 tablet (150 mg total) by mouth once a week. 02/01/22   Wallis Bamberg, PA-C  albuterol (PROVENTIL HFA;VENTOLIN HFA) 108 (90 Base) MCG/ACT inhaler Inhale 2 puffs into the lungs every 6 (six) hours as needed for wheezing or shortness of breath. Patient not taking: Reported on 04/12/2020 05/28/18 04/20/20  Katrinka Blazing, [place], CNM  azelastine (ASTELIN) 0.1 % nasal spray Place 2 sprays into both nostrils 2 (two) times  daily. Patient not taking: No sig reported 08/07/19 04/20/20  Belinda Fisher, PA-C    Family History Family History  Problem Relation Age of Onset   Anesthesia problems Mother        post-op N/V, hard to wake up   Arthritis Mother    Cancer Maternal Grandfather    Social History Social History   Tobacco Use   Smoking status: Some Days    Types: Cigars    Last attempt to quit: 09/24/2014    Years since quitting: 7.4   Smokeless tobacco: Never  Vaping Use   Vaping Use: Never used  Substance Use Topics   Alcohol use: Yes   Drug use: No   Allergies   Patient has no known allergies.  Review of Systems Review of Systems Pertinent findings revealed after performing a 14 point review of systems has been noted in the  history of present illness.  Physical Exam Triage Vital Signs ED Triage Vitals  Enc Vitals Group     BP 01/18/21 0827 (!) 147/82     Pulse Rate 01/18/21 0827 72     Resp 01/18/21 0827 18     Temp 01/18/21 0827 98.3 F (36.8 C)     Temp Source 01/18/21 0827 Oral     SpO2 01/18/21 0827 98 %     Weight --      Height --      Head Circumference --      Peak Flow --      Pain Score 01/18/21 0826 5     Pain Loc --      Pain Edu? --      Excl. in GC? --   No data found.  Updated Vital Signs BP 130/87 (BP Location: Left Arm)   Pulse 91   Temp 98.7 F (37.1 C) (Oral)   Resp 16   SpO2 98%   Physical Exam Vitals and nursing note reviewed.  Constitutional:      General: She is not in acute distress.    Appearance: Normal appearance. She is not ill-appearing.  HENT:     Head: Normocephalic and atraumatic.  Eyes:     General: Lids are normal.        Right eye: No discharge.        Left eye: No discharge.     Extraocular Movements: Extraocular movements intact.     Conjunctiva/sclera: Conjunctivae normal.     Right eye: Right conjunctiva is not injected.     Left eye: Left conjunctiva is not injected.  Neck:     Trachea: Trachea and phonation normal.  Cardiovascular:     Rate and Rhythm: Normal rate and regular rhythm.     Pulses: Normal pulses.     Heart sounds: Normal heart sounds. No murmur heard.    No friction rub. No gallop.  Pulmonary:     Effort: Pulmonary effort is normal. No accessory muscle usage, prolonged expiration or respiratory distress.     Breath sounds: Normal breath sounds. No stridor, decreased air movement or transmitted upper airway sounds. No decreased breath sounds, wheezing, rhonchi or rales.  Chest:     Chest wall: No tenderness.  Genitourinary:    Comments: Patient politely declines pelvic exam today, patient provided a vaginal swab for testing. Musculoskeletal:        General: Normal range of motion.     Cervical back: Normal range of motion  and neck supple. Normal range of motion.  Lymphadenopathy:  Cervical: No cervical adenopathy.  Skin:    General: Skin is warm and dry.     Findings: No erythema or rash.  Neurological:     General: No focal deficit present.     Mental Status: She is alert and oriented to person, place, and time.  Psychiatric:        Mood and Affect: Mood normal.        Behavior: Behavior normal.     Visual Acuity Right Eye Distance:   Left Eye Distance:   Bilateral Distance:    Right Eye Near:   Left Eye Near:    Bilateral Near:     UC Couse / Diagnostics / Procedures:     Radiology No results found.  Procedures Procedures (including critical care time) EKG  Pending results:  Labs Reviewed  POCT URINALYSIS DIP (MANUAL ENTRY) - Abnormal; Notable for the following components:      Result Value   Clarity, UA cloudy (*)    Ketones, POC UA trace (5) (*)    All other components within normal limits  POCT URINE PREGNANCY  CERVICOVAGINAL ANCILLARY ONLY    Medications Ordered in UC: Medications - No data to display  UC Diagnoses / Final Clinical Impressions(s)   I have reviewed the triage vital signs and the nursing notes.  Pertinent labs & imaging results that were available during my care of the patient were reviewed by me and considered in my medical decision making (see chart for details).    Final diagnoses:  Vaginitis and vulvovaginitis  If patient's vaginal swab test is positive for BV, recommend dual treatment with metronidazole gel PV for 5 days and metronidazole tablets 500 mg twice daily p.o for 7 days.  Patient is aware. STD screening was performed, patient advised that the results be posted to their MyChart and if any of the results are positive, they will be notified by phone, further treatment will be provided as indicated based on results of STD screening. Patient was advised to abstain from sexual intercourse until that they receive the results of their STD testing.   Patient was also advised to use condoms to protect themselves from STD exposure. Urinalysis today was normal. Urine pregnancy test was negative. Return precautions advised.  Drug allergies reviewed, all questions addressed.     Please see discharge instructions below for details of plan of care as provided to patient. ED Prescriptions     Medication Sig Dispense Auth. Provider   lidocaine (XYLOCAINE) 5 % ointment Apply 1 Application topically as needed. 35.44 g Theadora Rama Scales, PA-C      PDMP not reviewed this encounter.  Disposition Upon Discharge:  Condition: stable for discharge home  Patient presented with concern for an acute illness with associated systemic symptoms and significant discomfort requiring urgent management. In my opinion, this is a condition that a prudent lay person (someone who possesses an average knowledge of health and medicine) may potentially expect to result in complications if not addressed urgently such as respiratory distress, impairment of bodily function or dysfunction of bodily organs.   As such, the patient has been evaluated and assessed, work-up was performed and treatment was provided in alignment with urgent care protocols and evidence based medicine.  Patient/parent/caregiver has been advised that the patient may require follow up for further testing and/or treatment if the symptoms continue in spite of treatment, as clinically indicated and appropriate.  Routine symptom specific, illness specific and/or disease specific instructions were discussed with the patient  and/or caregiver at length.  Prevention strategies for avoiding STD exposure were also discussed.  The patient will follow up with their current PCP if and as advised. If the patient does not currently have a PCP we will assist them in obtaining one.   The patient may need specialty follow up if the symptoms continue, in spite of conservative treatment and management, for further  workup, evaluation, consultation and treatment as clinically indicated and appropriate.  Patient/parent/caregiver verbalized understanding and agreement of plan as discussed.  All questions were addressed during visit.  Please see discharge instructions below for further details of plan.  Discharge Instructions:   Discharge Instructions      The results of your vaginal swab test which screens for BV, yeast, gonorrhea, chlamydia and trichomonas will be made posted to your MyChart account once it is complete.  This typically takes 2 to 4 days.  Please abstain from sexual intercourse of any kind, vaginal, oral or anal, until you have received the results of your STD testing.     If any of your results are abnormal, you will receive a phone call regarding treatment.  Prescriptions, if any are needed, will be provided for you at your pharmacy.     For comfort, while you are waiting for the result of your vaginal swab test, I have provided you with an external vaginal cream that you can apply 3-4 times daily for relief of vaginal itching and burning.  I recommend that you abstain from sexual intercourse, tampon use or any other other intravaginal activities while you are waiting on these results and possible treatment.   If you have not had complete resolution of your symptoms after completing any recommended treatment or if your symptoms worsen, please return for repeat evaluation.   Thank you for visiting urgent care today.  I appreciate the opportunity to participate in your care.       This office note has been dictated using Teaching laboratory technician.  Unfortunately, this method of dictation can sometimes lead to typographical or grammatical errors.  I apologize for your inconvenience in advance if this occurs.  Please do not hesitate to reach out to me if clarification is needed.       Theadora Rama Scales, PA-C 03/23/22 1304

## 2022-03-25 ENCOUNTER — Telehealth: Payer: Self-pay

## 2022-03-25 LAB — CERVICOVAGINAL ANCILLARY ONLY
Bacterial Vaginitis (gardnerella): POSITIVE — AB
Candida Glabrata: NEGATIVE
Candida Vaginitis: NEGATIVE
Chlamydia: NEGATIVE
Comment: NEGATIVE
Comment: NEGATIVE
Comment: NEGATIVE
Comment: NEGATIVE
Comment: NEGATIVE
Comment: NORMAL
Neisseria Gonorrhea: NEGATIVE
Trichomonas: NEGATIVE

## 2022-03-25 NOTE — Telephone Encounter (Signed)
Patient called about the Lido cream medication that was sent in to Pharmacy. After speaking with Dr Windy Carina she advised that patient use OTC Vaginal Cream and wait till swab results come back. I spoke with patient and informed her of Dr. Murray Hodgkins instructions.

## 2022-03-26 ENCOUNTER — Telehealth (HOSPITAL_COMMUNITY): Payer: Self-pay | Admitting: Emergency Medicine

## 2022-03-26 MED ORDER — METRONIDAZOLE 0.75 % VA GEL: 1.0000 | Freq: Every day | VAGINAL | 0 refills | Status: AC

## 2022-03-26 MED ORDER — METRONIDAZOLE 500 MG PO TABS: 500.0000 mg | ORAL_TABLET | Freq: Two times a day (BID) | ORAL | 0 refills | Status: DC

## 2022-04-02 ENCOUNTER — Other Ambulatory Visit: Payer: Self-pay | Admitting: Physician Assistant

## 2022-04-02 ENCOUNTER — Ambulatory Visit
Admission: RE | Admit: 2022-04-02 | Discharge: 2022-04-02 | Disposition: A | Discharge status: Home / Self Care | Payer: Medicaid Other | Source: Ambulatory Visit | Attending: Physician Assistant | Admitting: Physician Assistant

## 2022-04-02 DIAGNOSIS — S99912A Unspecified injury of left ankle, initial encounter: Secondary | ICD-10-CM

## 2022-04-03 ENCOUNTER — Other Ambulatory Visit: Payer: Self-pay

## 2022-04-03 ENCOUNTER — Ambulatory Visit (INDEPENDENT_AMBULATORY_CARE_PROVIDER_SITE_OTHER): Payer: Medicaid Other | Admitting: Obstetrics and Gynecology

## 2022-04-03 ENCOUNTER — Other Ambulatory Visit (HOSPITAL_COMMUNITY)
Admission: RE | Admit: 2022-04-03 | Discharge: 2022-04-03 | Disposition: A | Discharge status: Home / Self Care | Payer: Medicaid Other | Source: Ambulatory Visit | Attending: Obstetrics and Gynecology | Admitting: Obstetrics and Gynecology

## 2022-04-03 VITALS — BP 122/82 | HR 85 | Ht 61.0 in | Wt 152.6 lb

## 2022-04-03 DIAGNOSIS — Z113 Encounter for screening for infections with a predominantly sexual mode of transmission: Secondary | ICD-10-CM

## 2022-04-03 DIAGNOSIS — Z01419 Encounter for gynecological examination (general) (routine) without abnormal findings: Secondary | ICD-10-CM

## 2022-04-03 DIAGNOSIS — R102 Pelvic and perineal pain: Secondary | ICD-10-CM

## 2022-04-03 NOTE — Progress Notes (Signed)
ANNUAL EXAM Patient name: Erin Jacobson MRN [id number]  Date of birth: 07/27/1990 Chief Complaint:   Gynecologic Exam  History of Present Illness:   Erin Jacobson is a 32 y.o. [phone] being seen today for a routine annual exam.  Current complaints: pelvic pain  Patient's last menstrual period was 03/11/2022 (exact date).  Intermittent pelvic pain. Not a full year but sometime last year started having pain. Intermittent sharp and dull, lasts for 1-2 minutes, 10x in a week but not necessarily daily. Nothing that she notes may provoke it. Can be unilateral or throughout vaginal area. Nothing to relieve due to quickness. Not positional. Pain with intercoure, positional but no sex in months. Diff pain w intercourse - same location, and actually causes pain. Premenstrual pain resolved after c-section. Cycle pain started within the last year. No intermenstrual bleeding.   Last pap 01/2021. Results were: NILM w/ HRHPV not done.  Last mammogram: n/a Last colonoscopy: n/a     01/29/2021    2:45 PM 01/03/2021    1:58 PM 05/15/2020    9:39 AM 10/24/2019    3:33 PM 05/28/2018    9:24 AM  Depression screen PHQ 2/9  Decreased Interest 3 0 1 0 2  Down, Depressed, Hopeless 3 0 3 1 3   PHQ - 2 Score 6 0 4 1 5   Altered sleeping 3 0 3 1 3   Tired, decreased energy 3 2 3 1 2   Change in appetite 1 0 2 1 2   Feeling bad or failure about yourself  3 2 3 2 1   Trouble concentrating 1 2 1 1  0  Moving slowly or fidgety/restless 0 0 1 0 0  Suicidal thoughts 3 0 2 1 2   PHQ-9 Score 20 6 19 8 15         01/29/2021    2:45 PM 01/03/2021    1:58 PM 05/15/2020    9:40 AM 10/24/2019    3:34 PM  GAD 7 : Generalized Anxiety Score  Nervous, Anxious, on Edge 3 0 2 1  Control/stop worrying 3 0 2 0  Worry too much - different things 3 0 3 0  Trouble relaxing 1 0 2 0  Restless 0 0 1 0  Easily annoyed or irritable 3 0 0 0  Afraid - awful might happen 2 0 1 0  Total GAD 7 Score 15 0 11 1     Review of Systems:    Pertinent items are noted in HPI Denies any headaches, blurred vision, fatigue, shortness of breath, chest pain, abdominal pain, abnormal vaginal discharge/itching/odor/irritation, problems with periods, bowel movements, urination, or intercourse unless otherwise stated above. Pertinent History Reviewed:  Reviewed past medical,surgical, social and family history.  Reviewed problem list, medications and allergies. Physical Assessment:   Vitals:   04/03/22 1549  BP: 122/82  Pulse: 85  Weight: 152 lb 9.6 oz (69.2 kg)  Height: 5\' 1"  (1.549 m)  Body mass index is 28.83 kg/m.        Physical Examination:   General appearance - well appearing, and in no distress  Mental status - alert, oriented to person, place, and time  Psych:  She has a normal mood and affect  Skin - warm and dry, normal color, no suspicious lesions noted  Chest - effort normal, all lung fields clear to auscultation bilaterally  Heart - normal rate and regular rhythm  Breasts - breasts appear normal, no suspicious masses, no skin or nipple changes or  axillary nodes  Abdomen - soft, nontender, nondistended, no masses or organomegaly  Pelvic -  VULVA: normal appearing vulva with no masses, tenderness or lesions   VAGINA: normal appearing vagina with normal color and discharge, no lesions   UTERUS: uterus is felt to be normal size, shape, consistency and nontender   ADNEXA: No adnexal masses or tenderness noted.  Nontender superficial pelvic floor muscles  No allodynia  Tender pelvic floor bilaterally   Extremities:  No swelling or varicosities noted  Chaperone present for exam  No results found for this or any previous visit (from the past 24 hour(s)).    Assessment & Plan:  1. Routine screening for STI (sexually transmitted infection) - RPR+HBsAg+HCVAb+... - Cervicovaginal ancillary only  2. Pelvic pain Pelvic myalgia - PFPT referral  [place] to complete workup  Follow up vaginitis swab - Ambulatory referral  to Physical Therapy - US PELVIC COMPLETE WITH TRANSVAGINAL; Future   No orders of the defined types were placed in this encounter.   Meds: No orders of the defined types were placed in this encounter.   Follow-up: No follow-ups on file.  Darliss Cheney, MD 04/03/2022 3:55 PM

## 2022-04-04 LAB — CERVICOVAGINAL ANCILLARY ONLY
Bacterial Vaginitis (gardnerella): POSITIVE — AB
Candida Glabrata: NEGATIVE
Candida Vaginitis: POSITIVE — AB
Chlamydia: NEGATIVE
Comment: NEGATIVE
Comment: NEGATIVE
Comment: NEGATIVE
Comment: NEGATIVE
Comment: NEGATIVE
Comment: NORMAL
Neisseria Gonorrhea: NEGATIVE
Trichomonas: NEGATIVE

## 2022-04-04 LAB — RPR+HBSAG+HCVAB+...
HIV Screen 4th Generation wRfx: NONREACTIVE
Hep C Virus Ab: NONREACTIVE
Hepatitis B Surface Ag: NEGATIVE
RPR Ser Ql: NONREACTIVE

## 2022-04-22 ENCOUNTER — Telehealth: Payer: Self-pay

## 2022-04-22 ENCOUNTER — Other Ambulatory Visit: Payer: Self-pay

## 2022-04-22 MED ORDER — METRONIDAZOLE 500 MG PO TABS: 500.0000 mg | ORAL_TABLET | Freq: Two times a day (BID) | ORAL | 0 refills | Status: AC

## 2022-04-22 MED ORDER — FLUCONAZOLE 150 MG PO TABS: 150.0000 mg | ORAL_TABLET | Freq: Once | ORAL | 0 refills | Status: AC

## 2022-04-22 NOTE — Telephone Encounter (Signed)
Called patient via mobile phone number listed in chart. Reviewed results of positive yeast and BV infection from last wet-prep swab done on 04/03/22. Rx for both BV and yeast sent to pharmacy on file, per protocol. Patient verified pharmacy on file. Instructed patient to take medications as prescribed; patient verbalized understanding and had no further questions.   Adonis Huguenin RN 04/22/22 at [phone]

## 2022-04-24 ENCOUNTER — Ambulatory Visit
Admission: RE | Admit: 2022-04-24 | Discharge: 2022-04-24 | Disposition: A | Discharge status: Home / Self Care | Payer: Medicaid Other | Source: Ambulatory Visit | Attending: Obstetrics and Gynecology | Admitting: Obstetrics and Gynecology

## 2022-04-24 DIAGNOSIS — R102 Pelvic and perineal pain: Secondary | ICD-10-CM | POA: Insufficient documentation

## 2022-04-24 DIAGNOSIS — Z9851 Tubal ligation status: Secondary | ICD-10-CM | POA: Diagnosis not present

## 2022-04-25 ENCOUNTER — Other Ambulatory Visit: Payer: Self-pay | Admitting: Obstetrics and Gynecology

## 2022-05-30 DIAGNOSIS — Z0000 Encounter for general adult medical examination without abnormal findings: Secondary | ICD-10-CM | POA: Diagnosis not present

## 2022-05-30 DIAGNOSIS — Z13220 Encounter for screening for lipoid disorders: Secondary | ICD-10-CM | POA: Diagnosis not present

## 2022-06-02 ENCOUNTER — Encounter (HOSPITAL_COMMUNITY): Payer: Self-pay

## 2022-06-02 ENCOUNTER — Other Ambulatory Visit: Payer: Self-pay

## 2022-06-02 ENCOUNTER — Emergency Department (HOSPITAL_COMMUNITY)
Admission: EM | Admit: 2022-06-02 | Discharge: 2022-06-02 | Disposition: A | Discharge status: Home / Self Care | Payer: BC Managed Care – PPO | Attending: Emergency Medicine | Admitting: Emergency Medicine

## 2022-06-02 DIAGNOSIS — Z1152 Encounter for screening for COVID-19: Secondary | ICD-10-CM | POA: Insufficient documentation

## 2022-06-02 DIAGNOSIS — J029 Acute pharyngitis, unspecified: Secondary | ICD-10-CM | POA: Diagnosis not present

## 2022-06-02 DIAGNOSIS — J020 Streptococcal pharyngitis: Secondary | ICD-10-CM | POA: Diagnosis not present

## 2022-06-02 LAB — RESP PANEL BY RT-PCR (RSV, FLU A&B, COVID)  RVPGX2
Influenza A by PCR: NEGATIVE
Influenza B by PCR: NEGATIVE
Resp Syncytial Virus by PCR: NEGATIVE
SARS Coronavirus 2 by RT PCR: NEGATIVE

## 2022-06-02 LAB — GROUP A STREP BY PCR: Group A Strep by PCR: DETECTED — AB

## 2022-06-02 MED ORDER — AMOXICILLIN 500 MG PO CAPS: 500.0000 mg | ORAL_CAPSULE | Freq: Two times a day (BID) | ORAL | 0 refills | Status: DC

## 2022-06-02 MED ORDER — AMOXICILLIN 500 MG PO CAPS: 500.0000 mg | ORAL_CAPSULE | Freq: Two times a day (BID) | ORAL | 0 refills | Status: AC

## 2022-06-02 NOTE — ED Provider Triage Note (Signed)
Emergency Medicine Provider Triage Evaluation Note  Erin Jacobson , a 32 y.o. female  was evaluated in triage.  Pt complains of sore throat, fever, congestion, diarrhea.  Symptoms started on Friday.  No sick contacts. Took over-the-counter COVID/flu test that was negative. Review of Systems  Positive:  Negative:   Physical Exam  BP (!) 155/71 (BP Location: Right Arm)   Pulse (!) 129   Temp (!) 102.5 F (39.2 C) (Oral)   Resp 20   Ht 5' (1.524 m)   Wt 68.9 kg   LMP 06/02/2022   SpO2 99%   BMI 29.69 kg/m  Gen:   Awake, no distress   Resp:  Normal effort  MSK:   Moves extremities without difficulty  Other:    Medical Decision Making  Medically screening exam initiated at 6:47 PM.  Appropriate orders placed.  GRACEMARY LETENDRE was informed that the remainder of the evaluation will be completed by another provider, this initial triage assessment does not replace that evaluation, and the importance of remaining in the ED until their evaluation is complete.     Tacy Learn, PA-C 06/02/22 [id number]

## 2022-06-02 NOTE — Discharge Instructions (Signed)
Take antibiotics as prescribed and complete the full course. Recheck with your primary care provider if not improving, return to ER for worsening or concerning symptoms.  Gargle warm salt water, drink warm tea with honey.  Motrin and Tylenol as needed as directed.

## 2022-06-02 NOTE — ED Provider Notes (Signed)
[facility] Provider Note   CSN: [id number] Arrival date & time: 06/02/22  1831     History  Chief Complaint  Patient presents with   Sore Throat    Erin Jacobson is a 32 y.o. female.  32 y.o. female  was evaluated in triage.  Pt complains of sore throat, fever, congestion, diarrhea.  Symptoms started on Friday.  No sick contacts. Took over-the-counter COVID/flu test that was negative.        Home Medications Prior to Admission medications   Medication Sig Start Date End Date Taking? Authorizing Provider  amoxicillin (AMOXIL) 500 MG capsule Take 1 capsule (500 mg total) by mouth 2 (two) times daily for 10 days. 06/02/22 06/12/22 Yes Tacy Learn, PA-C  lidocaine (XYLOCAINE) 5 % ointment Apply 1 Application topically as needed. 03/22/22   Lynden Oxford Scales, PA-C  albuterol (PROVENTIL HFA;VENTOLIN HFA) 108 (90 Base) MCG/ACT inhaler Inhale 2 puffs into the lungs every 6 (six) hours as needed for wheezing or shortness of breath. Patient not taking: Reported on 04/12/2020 05/28/18 04/20/20  Tamala Julian, [place], CNM  azelastine (ASTELIN) 0.1 % nasal spray Place 2 sprays into both nostrils 2 (two) times daily. Patient not taking: No sig reported 08/07/19 04/20/20  Ok Edwards, PA-C      Allergies    Patient has no known allergies.    Review of Systems   Review of Systems Negative except as per HPI Physical Exam Updated Vital Signs BP (!) 155/71 (BP Location: Right Arm)   Pulse (!) 129   Temp (!) 102.5 F (39.2 C) (Oral)   Resp 20   Ht 5' (1.524 m)   Wt 68.9 kg   LMP 06/02/2022   SpO2 99%   BMI 29.69 kg/m  Physical Exam  ED Results / Procedures / Treatments   Labs (all labs ordered are listed, but only abnormal results are displayed) Labs Reviewed  GROUP A STREP BY PCR - Abnormal; Notable for the following components:      Result Value   Group A Strep by PCR DETECTED (*)    All other components within normal limits  RESP  PANEL BY RT-PCR (RSV, FLU A&B, COVID)  [id number]    EKG None  Radiology No results found.  Procedures Procedures    Medications Ordered in ED Medications - No data to display  ED Course/ Medical Decision Making/ A&P                             Medical Decision Making  32 year old female presents with complaint of sore throat with fever, congestion and diarrhea.  Symptoms started 3 days ago without sick contacts.  Patient is febrile on arrival, tolerating secretions, speaking complete sentences without difficulty.  She is found to be negative for COVID/flu/RSV.  She is positive for strep.  Plan is to treat with antibiotics.  Advised return for worsening or concerning symptoms.        Final Clinical Impression(s) / ED Diagnoses Final diagnoses:  Strep throat    Rx / DC Orders ED Discharge Orders          Ordered    amoxicillin (AMOXIL) 500 MG capsule  2 times daily        06/02/22 2031              Tacy Learn, PA-C 06/02/22 2031    Sherwood Gambler, MD 06/05/22 1123

## 2022-06-02 NOTE — ED Triage Notes (Addendum)
Pt reports sore throat, headache, fever/chills, diarrhea onset Friday. She took flu/covid test at [facility] yesterday and it was negative. She last took Dayquil and Ibuprofen today at 1530.

## 2022-06-04 NOTE — Therapy (Unsigned)
OUTPATIENT PHYSICAL THERAPY FEMALE PELVIC EVALUATION   Patient Name: Erin Jacobson MRN: [id number] DOB:1990/11/28, 32 y.o., female Today's Date: 06/05/2022  END OF SESSION:  PT End of Session - 06/05/22 1601     Visit Number 1    Date for PT Re-Evaluation 08/28/22    Authorization Type [employer] Medicaid    PT Start Time 1500    PT Stop Time 1545    PT Time Calculation (min) 45 min    Activity Tolerance Patient tolerated treatment well    Behavior During Therapy WFL for tasks assessed/performed             Past Medical History:  Diagnosis Date   Complication of anesthesia    Depression    on meds, doing ok   DVT of upper extremity (deep vein thrombosis) (HCC)    GERD (gastroesophageal reflux disease)    no current meds for reflux   Infection    UTI   Kidney stone    stent and foley placed to pass kidney stones 05/05/2014   PONV (postoperative nausea and vomiting)    Trichomoniasis    Past Surgical History:  Procedure Laterality Date   APPENDECTOMY  04/01/2004   ex. lap.   CESAREAN SECTION N/A 06/11/2015   Procedure: CESAREAN SECTION;  Surgeon: Jerelyn Charles, MD;  Location: [facility];  Service: Obstetrics;  Laterality: N/A;   CYSTOSCOPY WITH RETROGRADE PYELOGRAM, URETEROSCOPY AND STENT PLACEMENT Left 05/05/2014   Procedure: LEFT  URETEROSCOPY,  RETROGRADE PYELOGRAM,  AND STENT PLACEMENT, STONE REMOVAL;  Surgeon: Ardis Hughs, MD;  Location: [facility];  Service: Urology;  Laterality: Left;   HOLMIUM LASER APPLICATION Left 0000000   Procedure: HOLMIUM LASER APPLICATION;  Surgeon: Ardis Hughs, MD;  Location: [facility];  Service: Urology;  Laterality: Left;   KNEE SURGERY     LAPAROSCOPIC TUBAL LIGATION Bilateral 09/29/2018   Procedure: LAPAROSCOPIC TUBAL LIGATION WITH FILSHIE CLIPS;  Surgeon: Emily Filbert, MD;  Location: [facility];  Service: Gynecology;  Laterality: Bilateral;   Patient Active Problem List   Diagnosis Date Noted   Pap smear of cervix  satisfactory but lacking transformation zone 02/01/2021   History of sexually transmitted disease 04/09/2018   Anxiety and depression 04/09/2018   History of cesarean delivery 03/12/2018   History of deep vein thrombosis (DVT) during pregnancy 03/12/2018    PCP: Lennie Odor, PA  REFERRING PROVIDER: Darliss Cheney, MD   REFERRING DIAG: R10.2 (ICD-10-CM) - Pelvic pain   THERAPY DIAG:  Cramp and spasm  Pelvic pain  Rationale for Evaluation and Treatment: Rehabilitation  ONSET DATE: 2017  SUBJECTIVE:  SUBJECTIVE STATEMENT: Had issues after her first son in 2017. Patient a c-section.  Fluid intake: Yes: water, juice, wine, probiotic    PAIN:  Are you having pain? Yes NPRS scale: 7/10 Pain location:  groin, suprapubic area, vaginal  Pain type: dull and sharp Pain description: intermittent ; Intermittent sharp and dull, lasts for 1-2 minutes, 10x in a week but not necessarily daily.   Aggravating factors: random pain Relieving factors: nothing  PRECAUTIONS: None  WEIGHT BEARING RESTRICTIONS: No  FALLS:  Has patient fallen in last 6 months? Yes. 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  PLOF: Independent  PATIENT GOALS: reduce pain, relax her muscles, reduce frequency of urination  PERTINENT HISTORY:  1 c-section   BOWEL MOVEMENT: Pain with bowel movement: No  URINATION: Pain with urination: Yes, 5/10 Fully empty bladder: No, strains to fully empty her bladder Stream:  depends Urgency: Yes:   Frequency: urinate every 5-10 minutes depending on the amount of liquid Leakage:  none  INTERCOURSE: yes  PREGNANCY: Vaginal deliveries 2 Tearing  Yes: just a little C-section deliveries 1 Currently pregnant No   OBJECTIVE:   DIAGNOSTIC FINDINGS:  MD reports tenderness in both side of the pelvic floor  PATIENT SURVEYS:  POPIQ-7 10  COGNITION: Overall cognitive status: Within functional limits for tasks assessed     SENSATION: Light touch: Appears intact Proprioception: Appears intact   POSTURE:  increased lordosis at Thoracic lumbar junction  PELVIC ALIGNMENT: Pelvis in correct alignment  LUMBARAROM/PROM:  A/PROM A/PROM  eval  Flexion Full with tightness in t/l area  Extension full  Right lateral flexion full  Left lateral flexion full  Right rotation full  Left rotation full   (Blank rows = not tested)  LOWER EXTREMITY ROM: bilateral hip ROM is full   LOWER EXTREMITY MMT:  MMT Right eval Left eval  Hip extension 4/5 4/5  Hip abduction 4-/5 4-/5  Hip adduction 4/5 4/5   PALPATION:   General  tightness in the diaphragm, difficulty with contracting the upper and lower abdomen together, increased fascial tightness in the area of the c-section and appendectomy scar                External Perineal Exam no tenderness and good coloring                             Internal Pelvic Floor right obturator internist, iliococcygeus, right side of bladder; the iliococcygeus is not tender when therapist flexes the right hip  Patient confirms identification and approves PT to assess internal pelvic floor and treatment Yes  PELVIC MMT:   MMT eval  Vaginal 3/5 with good hug and pull up  Diastasis Recti none  (Blank rows = not tested)        TONE: Increased on the right  PROLAPSE: None  TODAY'S TREATMENT:  DATE: 06/05/22  EVAL See below   PATIENT EDUCATION:  Education details: Access Code: [email or web address] Person educated: Patient Education method: Explanation, Demonstration, Tactile  cues, Verbal cues, and Handouts Education comprehension: verbalized understanding, returned demonstration, verbal cues required, tactile cues required, and needs further education  HOME EXERCISE PROGRAM: 06/05/22 Access Code: [email or web address] URL: [email or web address] Date: 06/05/2022 Prepared by: Earlie Counts  Program Notes sit on ball on right side and roll it back and forth 10x 2 times per day  Exercises - Seated Piriformis Stretch with Trunk Bend  - 1 x daily - 7 x weekly - 2 sets - 1 reps - 30 sec hold - Side Lunge Adductor Stretch  - 1 x daily - 7 x weekly - 2 sets - 1 reps - 30 sec hold  ASSESSMENT:  CLINICAL IMPRESSION: Patient is a 32 y.o. female who was seen today for physical therapy evaluation and treatment for pelvic pain. Patient reports random pelvic pain at level 7/10 and when she urinates is at 5/10. She has tenderness located on the right iliococcygeus with hip in neutral and obturator internist, and right side of urethra. She has to strain to empty her bladder. Patient urinates every 5-10 minutes. She is not able to contract the lower and upper abdominals correctly. She has fascial tightness of the c-section scar and appendectomy scar. Patient has weakness in the hips and tightness in the thoracolumbar junction. Pelvic floor strength is 3/5 with hug of therapist finger and lift. Patient would benefit from skilled therapy to improve tissue mobility, reduce tension and trigger points to reduce pelvic pain.   OBJECTIVE IMPAIRMENTS: decreased endurance, decreased strength, increased fascial restrictions, increased muscle spasms, and pain.   ACTIVITY LIMITATIONS: lifting, sitting, standing, and toileting  PARTICIPATION LIMITATIONS: cleaning, laundry, community activity, and occupation  PERSONAL FACTORS: 1 comorbidity: c-section  are also affecting patient's functional outcome.   REHAB POTENTIAL: Excellent  CLINICAL DECISION MAKING: Stable/uncomplicated  EVALUATION  COMPLEXITY: Low   GOALS: Goals reviewed with patient? Yes  SHORT TERM GOALS: Target date: 07/01/22  Patient independent with scar massage.  Baseline:Not educated yet Goal status: INITIAL   LONG TERM GOALS: Target date: 08/28/22  Patient independent with core strength and pelvic floor coordination to reduce her pain.  Baseline: Not educated yet Goal status: INITIAL  2.  Patient is able to urinate without straining due to the ability to fully relax her pelvic floor.  Baseline:  Goal status: INITIAL  3.  Patient frustration with her pain decreased due to her POPIQ-7 is </= 5/10.  Baseline: POPIQ-7 10 Goal status: INITIAL  4.  Patient pain with urination decreased </= 1/10 due to reduction of trigger points in the pelvic floor.  Baseline: Pain level 5/10 Goal status: INITIAL  5.  Patient daily pelvic pain with work and home tasks decreased </- 1/10 due to reduction of trigger points in the pelvic floor Baseline: Pain level 7/10 Goal status: INITIAL   PLAN:  PT FREQUENCY: 1x/week  PT DURATION: 12 weeks  PLANNED INTERVENTIONS: Therapeutic exercises, Therapeutic activity, Neuromuscular re-education, Patient/Family education, Joint mobilization, Dry Needling, Electrical stimulation, Spinal mobilization, Cryotherapy, Moist heat, Biofeedback, and Manual therapy  PLAN FOR NEXT SESSION: Manual work to right pelvic floor, scar massage, diaphragm mobility   Earlie Counts, PT 06/05/22 4:02 PM

## 2022-06-05 ENCOUNTER — Other Ambulatory Visit: Payer: Self-pay

## 2022-06-05 ENCOUNTER — Encounter: Payer: BC Managed Care – PPO | Attending: Obstetrics and Gynecology | Admitting: Physical Therapy

## 2022-06-05 ENCOUNTER — Encounter: Payer: Self-pay | Admitting: Physical Therapy

## 2022-06-05 DIAGNOSIS — Z889 Allergy status to unspecified drugs, medicaments and biological substances status: Secondary | ICD-10-CM | POA: Diagnosis not present

## 2022-06-05 DIAGNOSIS — R252 Cramp and spasm: Secondary | ICD-10-CM | POA: Insufficient documentation

## 2022-06-05 DIAGNOSIS — R102 Pelvic and perineal pain: Secondary | ICD-10-CM | POA: Diagnosis not present

## 2022-06-05 DIAGNOSIS — J020 Streptococcal pharyngitis: Secondary | ICD-10-CM | POA: Diagnosis not present

## 2022-06-12 ENCOUNTER — Encounter: Payer: BC Managed Care – PPO | Admitting: Physical Therapy

## 2022-06-12 ENCOUNTER — Encounter: Payer: Self-pay | Admitting: Physical Therapy

## 2022-06-12 DIAGNOSIS — R252 Cramp and spasm: Secondary | ICD-10-CM

## 2022-06-12 DIAGNOSIS — R102 Pelvic and perineal pain: Secondary | ICD-10-CM

## 2022-06-12 NOTE — Therapy (Signed)
OUTPATIENT PHYSICAL THERAPY TREATMENT NOTE   Patient Name: Erin Jacobson MRN: [id number] DOB:04/17/1990, 32 y.o., female Today's Date: 06/12/2022  PCP: Lennie Odor, PA  REFERRING PROVIDER: Darliss Cheney, MD   END OF SESSION:   PT End of Session - 06/12/22 1507     Visit Number 2    Date for PT Re-Evaluation 08/28/22    Authorization Type [employer] Medicaid    Authorization - Visit Number 1    Authorization - Number of Visits 27    PT Start Time 1500    PT Stop Time 1545    PT Time Calculation (min) 45 min    Activity Tolerance Patient tolerated treatment well    Behavior During Therapy WFL for tasks assessed/performed             Past Medical History:  Diagnosis Date   Complication of anesthesia    Depression    on meds, doing ok   DVT of upper extremity (deep vein thrombosis) (HCC)    GERD (gastroesophageal reflux disease)    no current meds for reflux   Infection    UTI   Kidney stone    stent and foley placed to pass kidney stones 05/05/2014   PONV (postoperative nausea and vomiting)    Trichomoniasis    Past Surgical History:  Procedure Laterality Date   APPENDECTOMY  04/01/2004   ex. lap.   CESAREAN SECTION N/A 06/11/2015   Procedure: CESAREAN SECTION;  Surgeon: Jerelyn Charles, MD;  Location: [facility];  Service: Obstetrics;  Laterality: N/A;   CYSTOSCOPY WITH RETROGRADE PYELOGRAM, URETEROSCOPY AND STENT PLACEMENT Left 05/05/2014   Procedure: LEFT  URETEROSCOPY,  RETROGRADE PYELOGRAM,  AND STENT PLACEMENT, STONE REMOVAL;  Surgeon: Ardis Hughs, MD;  Location: [facility];  Service: Urology;  Laterality: Left;   HOLMIUM LASER APPLICATION Left 0000000   Procedure: HOLMIUM LASER APPLICATION;  Surgeon: Ardis Hughs, MD;  Location: [facility];  Service: Urology;  Laterality: Left;   KNEE SURGERY     LAPAROSCOPIC TUBAL LIGATION Bilateral 09/29/2018   Procedure: LAPAROSCOPIC TUBAL LIGATION WITH FILSHIE CLIPS;  Surgeon: Emily Filbert, MD;  Location: [facility];  Service: Gynecology;  Laterality: Bilateral;   Patient Active Problem List   Diagnosis Date Noted   Pap smear of cervix satisfactory but lacking transformation zone 02/01/2021   History of sexually transmitted disease 04/09/2018   Anxiety and depression 04/09/2018   History of cesarean delivery 03/12/2018   History of deep vein thrombosis (DVT) during pregnancy 03/12/2018   REFERRING DIAG: R10.2 (ICD-10-CM) - Pelvic pain    THERAPY DIAG:  Cramp and spasm   Pelvic pain   Rationale for Evaluation and Treatment: Rehabilitation   ONSET DATE: 2017   SUBJECTIVE:  SUBJECTIVE STATEMENT: Stretches help pain.   Fluid intake: Yes: water, juice, wine, probiotic     PAIN:  Are you having pain? Yes NPRS scale: 7/10 Pain location:  groin, suprapubic area, vaginal   Pain type: dull and sharp Pain description: intermittent ; Intermittent sharp and dull, lasts for 1-2 minutes, 10x in a week but not necessarily daily.    Aggravating factors: random pain Relieving factors: nothing   PRECAUTIONS: None   WEIGHT BEARING RESTRICTIONS: No   FALLS:  Has patient fallen in last 6 months? Yes. 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   PLOF: Independent   PATIENT GOALS: reduce pain, relax her muscles, reduce frequency of urination   PERTINENT HISTORY:  1 c-section     BOWEL MOVEMENT: Pain with bowel movement: No   URINATION: Pain with urination: Yes, 5/10 Fully empty bladder: No, strains to fully empty her bladder Stream:  depends Urgency: Yes:   Frequency: urinate every 5-10 minutes depending on the amount of liquid Leakage:  none   INTERCOURSE: yes    PREGNANCY: Vaginal deliveries 2 Tearing Yes: just a little C-section deliveries 1 Currently pregnant No     OBJECTIVE:    DIAGNOSTIC FINDINGS:  MD reports tenderness in both side of the pelvic floor   PATIENT SURVEYS:  POPIQ-7 10   COGNITION: Overall cognitive status: Within functional limits for tasks assessed                          SENSATION: Light touch: Appears intact Proprioception: Appears intact     POSTURE:  increased lordosis at Thoracic lumbar junction   PELVIC ALIGNMENT: Pelvis in correct alignment   LUMBARAROM/PROM:   A/PROM A/PROM  eval  Flexion Full with tightness in t/l area  Extension full  Right lateral flexion full  Left lateral flexion full  Right rotation full  Left rotation full   (Blank rows = not tested)   LOWER EXTREMITY ROM: bilateral hip ROM is full     LOWER EXTREMITY MMT:   MMT Right eval Left eval  Hip extension 4/5 4/5  Hip abduction 4-/5 4-/5  Hip adduction 4/5 4/5    PALPATION:   General  tightness in the diaphragm, difficulty with contracting the upper and lower abdomen together, increased fascial tightness in the area of the c-section and appendectomy scar                 External Perineal Exam no tenderness and good coloring                             Internal Pelvic Floor right obturator internist, iliococcygeus, right side of bladder; the iliococcygeus is not tender when therapist flexes the right hip   Patient confirms identification and approves PT to assess internal pelvic floor and treatment Yes   PELVIC MMT:   MMT eval  Vaginal 3/5 with good hug and pull up  Diastasis Recti none  (Blank rows = not tested)         TONE: Increased on the right   PROLAPSE: None   TODAY'S TREATMENT:    06/12/22 Manual: Soft tissue mobilization: Manual work to lower abdomina area to increase tissue mobility Scar tissue mobilization: C-section scar massage  to improve scar mobility  and educated patient on how to  perform at home Myofascial release: Fascial release around the umbilicus, along the suprapubic area, and lateral lower abdominal area to reduce pain and improve mobility Exercises: Stretches/mobility: Piriformis stretch in sitting holding 30 sec bil.  Hip adductor stretch in standing bil.  Strengthening: Supine ball squeeze with transverse abdominus contraction with tactile cues    PATIENT EDUCATION:  06/12/22 Education details: Access Code: [email or web address] Person educated: Patient Education method: Explanation, Demonstration, Tactile cues, Verbal cues, and Handouts Education comprehension: verbalized understanding, returned demonstration, verbal cues required, tactile cues required, and needs further education   HOME EXERCISE PROGRAM: 06/12/22 Access Code: [email or web address] URL: [email or web address] Date: 06/12/2022 Prepared by: Earlie Counts  Program Notes sit on ball on right side and roll it back and forth 10x 2 times per day  Exercises - Seated Piriformis Stretch with Trunk Bend  - 1 x daily - 7 x weekly - 2 sets - 1 reps - 30 sec hold - Side Lunge Adductor Stretch  - 1 x daily - 7 x weekly - 2 sets - 1 reps - 30 sec hold - Hooklying Transversus Abdominis Palpation  - 1 x daily - 7 x weekly - 1 sets - 10 reps   ASSESSMENT:   CLINICAL IMPRESSION: Patient is a 32 y.o. female who was seen today for physical therapy  treatment for pelvic pain.  Patient understands how to contract the lower abdominals by the end of the treatment. She understands how to perform scar massage. She has fascial restrictions on the lower abdominal region. Patient would benefit from skilled therapy to improve tissue mobility, reduce tension and trigger points to reduce pelvic pain.    OBJECTIVE IMPAIRMENTS: decreased endurance, decreased strength, increased fascial restrictions, increased muscle spasms, and pain.    ACTIVITY LIMITATIONS: lifting, sitting, standing, and toileting   PARTICIPATION  LIMITATIONS: cleaning, laundry, community activity, and occupation   PERSONAL FACTORS: 1 comorbidity: c-section  are also affecting patient's functional outcome.    REHAB POTENTIAL: Excellent   CLINICAL DECISION MAKING: Stable/uncomplicated   EVALUATION COMPLEXITY: Low     GOALS: Goals reviewed with patient? Yes   SHORT TERM GOALS: Target date: 07/01/22   Patient independent with scar massage.  Baseline:Not educated yet Goal status: INITIAL     LONG TERM GOALS: Target date: 08/28/22   Patient independent with core strength and pelvic floor coordination to reduce her pain.  Baseline: Not educated yet Goal status: INITIAL   2.  Patient is able to urinate without straining due to the ability to fully relax her pelvic floor.  Baseline:  Goal status: INITIAL   3.  Patient frustration with her pain decreased due to her POPIQ-7 is </= 5/10.  Baseline: POPIQ-7 10 Goal status: INITIAL   4.  Patient pain with urination decreased </= 1/10 due to reduction of trigger points in the pelvic floor.  Baseline: Pain level 5/10 Goal status: INITIAL   5.  Patient daily pelvic pain with work and home tasks decreased </- 1/10 due to reduction of trigger points in the pelvic floor Baseline: Pain level 7/10 Goal status: INITIAL     PLAN:   PT FREQUENCY: 1x/week   PT DURATION: 12 weeks   PLANNED INTERVENTIONS: Therapeutic exercises, Therapeutic activity, Neuromuscular re-education, Patient/Family education, Joint mobilization, Dry Needling, Electrical stimulation, Spinal mobilization, Cryotherapy, Moist heat, Biofeedback, and Manual therapy   PLAN FOR NEXT SESSION: Manual work to right pelvic floor, scar massage, diaphragm mobility, abdominal contraction  Earlie Counts, PT 06/12/22 3:50  PM

## 2022-06-19 ENCOUNTER — Other Ambulatory Visit (HOSPITAL_COMMUNITY)
Admission: RE | Admit: 2022-06-19 | Discharge: 2022-06-19 | Disposition: A | Discharge status: Home / Self Care | Payer: BC Managed Care – PPO | Source: Ambulatory Visit | Attending: Family Medicine | Admitting: Family Medicine

## 2022-06-19 ENCOUNTER — Encounter: Payer: BC Managed Care – PPO | Admitting: Physical Therapy

## 2022-06-19 ENCOUNTER — Ambulatory Visit (INDEPENDENT_AMBULATORY_CARE_PROVIDER_SITE_OTHER): Payer: BC Managed Care – PPO | Admitting: *Deleted

## 2022-06-19 ENCOUNTER — Encounter: Payer: Self-pay | Admitting: Physical Therapy

## 2022-06-19 ENCOUNTER — Other Ambulatory Visit: Payer: Self-pay

## 2022-06-19 VITALS — BP 124/70 | HR 76 | Ht 60.5 in | Wt 142.9 lb

## 2022-06-19 DIAGNOSIS — R102 Pelvic and perineal pain: Secondary | ICD-10-CM

## 2022-06-19 DIAGNOSIS — N898 Other specified noninflammatory disorders of vagina: Secondary | ICD-10-CM

## 2022-06-19 DIAGNOSIS — R252 Cramp and spasm: Secondary | ICD-10-CM | POA: Diagnosis not present

## 2022-06-19 NOTE — Therapy (Addendum)
OUTPATIENT PHYSICAL THERAPY TREATMENT NOTE   Patient Name: Erin Jacobson MRN: [id number] DOB:Sep 27, 1990, 32 y.o., female Today's Date: 06/19/2022  PCP: Lennie Odor, PA  REFERRING PROVIDER: Darliss Cheney, MD   END OF SESSION:   PT End of Session - 06/19/22 1503     Visit Number 3    Date for PT Re-Evaluation 08/28/22    Authorization Type [employer]    Authorization - Visit Number 2    Authorization - Number of Visits 27    PT Start Time 1400    PT Stop Time 1445    PT Time Calculation (min) 45 min    Activity Tolerance Patient tolerated treatment well    Behavior During Therapy WFL for tasks assessed/performed             Past Medical History:  Diagnosis Date   Complication of anesthesia    Depression    on meds, doing ok   DVT of upper extremity (deep vein thrombosis) (HCC)    GERD (gastroesophageal reflux disease)    no current meds for reflux   Infection    UTI   Kidney stone    stent and foley placed to pass kidney stones 05/05/2014   PONV (postoperative nausea and vomiting)    Trichomoniasis    Past Surgical History:  Procedure Laterality Date   APPENDECTOMY  04/01/2004   ex. lap.   CESAREAN SECTION N/A 06/11/2015   Procedure: CESAREAN SECTION;  Surgeon: Jerelyn Charles, MD;  Location: [facility];  Service: Obstetrics;  Laterality: N/A;   CYSTOSCOPY WITH RETROGRADE PYELOGRAM, URETEROSCOPY AND STENT PLACEMENT Left 05/05/2014   Procedure: LEFT  URETEROSCOPY,  RETROGRADE PYELOGRAM,  AND STENT PLACEMENT, STONE REMOVAL;  Surgeon: Ardis Hughs, MD;  Location: [facility];  Service: Urology;  Laterality: Left;   HOLMIUM LASER APPLICATION Left 0000000   Procedure: HOLMIUM LASER APPLICATION;  Surgeon: Ardis Hughs, MD;  Location: [facility];  Service: Urology;  Laterality: Left;   KNEE SURGERY     LAPAROSCOPIC TUBAL LIGATION Bilateral 09/29/2018   Procedure: LAPAROSCOPIC TUBAL LIGATION WITH FILSHIE CLIPS;  Surgeon: Emily Filbert, MD;  Location: [facility];  Service: Gynecology;  Laterality: Bilateral;   Patient Active Problem List   Diagnosis Date Noted   Pap smear of cervix satisfactory but lacking transformation zone 02/01/2021   History of sexually transmitted disease 04/09/2018   Anxiety and depression 04/09/2018   History of cesarean delivery 03/12/2018   History of deep vein thrombosis (DVT) during pregnancy 03/12/2018   REFERRING DIAG: R10.2 (ICD-10-CM) - Pelvic pain    THERAPY DIAG:  Cramp and spasm   Pelvic pain   Rationale for Evaluation and Treatment: Rehabilitation   ONSET DATE: 2017   SUBJECTIVE:  SUBJECTIVE STATEMENT:   Fluid intake: Yes: water, juice, wine, probiotic     PAIN:  Are you having pain? Yes NPRS scale: 7/10 Pain location:  groin, suprapubic area, vaginal   Pain type: dull and sharp Pain description: intermittent ; Intermittent sharp and dull, lasts for 1-2 minutes, 10x in a week but not necessarily daily.    Aggravating factors: random pain Relieving factors: nothing   PRECAUTIONS: None   WEIGHT BEARING RESTRICTIONS: No   FALLS:  Has patient fallen in last 6 months? Yes. 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   PLOF: Independent   PATIENT GOALS: reduce pain, relax her muscles, reduce frequency of urination   PERTINENT HISTORY:  1 c-section     BOWEL MOVEMENT: Pain with bowel movement: No   URINATION: Pain with urination: Yes, 5/10 Fully empty bladder: No, strains to fully empty her bladder Stream:  depends Urgency: Yes:   Frequency: urinate every 5-10 minutes depending on the amount of liquid Leakage:  none   INTERCOURSE: yes   PREGNANCY: Vaginal  deliveries 2 Tearing Yes: just a little C-section deliveries 1 Currently pregnant No     OBJECTIVE:    DIAGNOSTIC FINDINGS:  MD reports tenderness in both side of the pelvic floor   PATIENT SURVEYS:  POPIQ-7 10   COGNITION: Overall cognitive status: Within functional limits for tasks assessed                          SENSATION: Light touch: Appears intact Proprioception: Appears intact     POSTURE:  increased lordosis at Thoracic lumbar junction   PELVIC ALIGNMENT: Pelvis in correct alignment   LUMBARAROM/PROM:   A/PROM A/PROM  eval  Flexion Full with tightness in t/l area  Extension full  Right lateral flexion full  Left lateral flexion full  Right rotation full  Left rotation full   (Blank rows = not tested)   LOWER EXTREMITY ROM: bilateral hip ROM is full     LOWER EXTREMITY MMT:   MMT Right eval Left eval  Hip extension 4/5 4/5  Hip abduction 4-/5 4-/5  Hip adduction 4/5 4/5    PALPATION:   General  tightness in the diaphragm, difficulty with contracting the upper and lower abdomen together, increased fascial tightness in the area of the c-section and appendectomy scar                 External Perineal Exam no tenderness and good coloring                             Internal Pelvic Floor right obturator internist, iliococcygeus, right side of bladder; the iliococcygeus is not tender when therapist flexes the right hip   Patient confirms identification and approves PT to assess internal pelvic floor and treatment Yes   PELVIC MMT:   MMT eval  Vaginal 3/5 with good hug and pull up  Diastasis Recti none  (Blank rows = not tested)         TONE: Increased on the right   PROLAPSE: None   TODAY'S TREATMENT:   06/19/22 Neuromuscular re-education: Down training: Educated patient on the urge to void to deter bladder urgency and she can walk to the bathroom slowly Exercises: Stretches/mobility: Piriformis stretch in sitting  holding 30 sec bil.   Hip adductor stretch in standing bil.  Sitting with yoga block between knees and internally rotate the hips 15x Sitting with yoga block between knees and move the knees forward and back Sitting open book with yoga block between knees Sitting with yoga block between knees and move one knee forward as the opposite arm goes to the forward knee 15 x each side Sitting on foam roll and massage the pelvic floor by rolling side to side Sit on foam roll and massage the piriformis bil.  Lay on side on foam roll and massage the ITB band bil.  Lay prone on the foam roll and massage the quads Frog leg stretch rocking back and forth      06/12/22 Manual: Soft tissue mobilization: Manual work to lower abdomina area to increase tissue mobility Scar tissue mobilization: C-section scar massage  to improve scar mobility and educated patient on how to perform at home Myofascial release: Fascial release around the umbilicus, along the suprapubic area, and lateral lower abdominal area to reduce pain and improve mobility Exercises: Stretches/mobility: Piriformis stretch in sitting holding 30 sec bil.  Hip adductor stretch in standing bil.  Strengthening: Supine ball squeeze with transverse abdominus contraction with tactile cues    PATIENT EDUCATION:  06/19/22 Education details: Access Code: [email or web address], urge to void Person educated: Patient Education method: Explanation, Demonstration, Tactile cues, Verbal cues, and Handouts Education comprehension: verbalized understanding, returned demonstration, verbal cues required, tactile cues required, and needs further education   HOME EXERCISE PROGRAM: 06/19/22 Access Code: [email or web address] URL: [email or web address] Date: 06/19/2022 Prepared by: Earlie Counts  Program Notes sit on ball on right side and roll it back and forth 10x 2 times per day  Exercises - Seated Piriformis Stretch with Trunk Bend  - 1 x daily - 7 x weekly - 2 sets - 1 reps - 30  sec hold - Side Lunge Adductor Stretch  - 1 x daily - 7 x weekly - 2 sets - 1 reps - 30 sec hold - Hooklying Transversus Abdominis Palpation  - 1 x daily - 7 x weekly - 1 sets - 10 reps - Piriformis Mobilization on Foam Roll  - 1 x daily - 3 x weekly - 1 sets - 10 reps - Sidelying IT Band Foam Roll Mobilization  - 1 x daily - 3 x weekly - 1 sets - 10 reps - Seated Hip Internal Rotation with Ball and Resistance  - 1 x daily - 3 x weekly - 1 sets - 10 reps - Hip Flexor Mobilization with Foam Roll  - 1 x daily - 3 x weekly - 1 sets - 10 reps   ASSESSMENT:   CLINICAL IMPRESSION: Patient is a 32 y.o. female who was seen today for physical therapy  treatment for pelvic pain.   Patient has not been straining as much to urinate. Patient is learning ways to relax the pelvic floor without internal manual work. She understands the way to delay the urge to void. She is doing her scar massage regularly. Patient would benefit from skilled therapy to improve tissue mobility, reduce tension and trigger points to reduce pelvic pain.    OBJECTIVE IMPAIRMENTS: decreased endurance, decreased strength, increased fascial restrictions, increased muscle spasms, and pain.    ACTIVITY LIMITATIONS: lifting, sitting, standing, and toileting   PARTICIPATION LIMITATIONS: cleaning, laundry, community activity, and occupation   PERSONAL FACTORS: 1 comorbidity: c-section  are also affecting patient's functional outcome.    REHAB POTENTIAL: Excellent  CLINICAL DECISION MAKING: Stable/uncomplicated   EVALUATION COMPLEXITY: Low     GOALS: Goals reviewed with patient? Yes   SHORT TERM GOALS: Target date: 07/01/22   Patient independent with scar massage.  Baseline:Not educated yet Goal status: Met 06/19/22    LONG TERM GOALS: Target date: 08/28/22   Patient independent with core strength and pelvic floor coordination to reduce her pain.  Baseline: Not educated yet Goal status: INITIAL   2.  Patient is able to  urinate without straining due to the ability to fully relax her pelvic floor.  Baseline:  Goal status: INITIAL   3.  Patient frustration with her pain decreased due to her POPIQ-7 is </= 5/10.  Baseline: POPIQ-7 10 Goal status: INITIAL   4.  Patient pain with urination decreased </= 1/10 due to reduction of trigger points in the pelvic floor.  Baseline: Pain level 5/10 Goal status: INITIAL   5.  Patient daily pelvic pain with work and home tasks decreased </- 1/10 due to reduction of trigger points in the pelvic floor Baseline: Pain level 7/10 Goal status: INITIAL     PLAN:   PT FREQUENCY: 1x/week   PT DURATION: 12 weeks   PLANNED INTERVENTIONS: Therapeutic exercises, Therapeutic activity, Neuromuscular re-education, Patient/Family education, Joint mobilization, Dry Needling, Electrical stimulation, Spinal mobilization, Cryotherapy, Moist heat, Biofeedback, and Manual therapy   PLAN FOR NEXT SESSION: Manual work to right pelvic floor, scar massage, diaphragm mobility, abdominal contraction  Eulis Foster, PT 06/19/22 3:48 PM   PHYSICAL THERAPY DISCHARGE SUMMARY  Visits from Start of Care: 3  Current functional level related to goals / functional outcomes: See above. Has not returned since last visit. No-showed on 09/23/22.    Remaining deficits: See above.    Education / Equipment: HEP   Patient agrees to discharge. Patient goals were not met. Patient is being discharged due to not returning since the last visit.Thank you for the referral. Eulis Foster, PT 09/23/22 1:26 PM

## 2022-06-19 NOTE — Patient Instructions (Signed)
Urge Incontinence  Ideal urination frequency is every 2-4 wakeful hours, which equates to 5-8 times within a 24-hour period.   Urge incontinence is leakage that occurs when the bladder muscle contracts, creating a sudden need to go before getting to the bathroom.   Going too often when your bladder isn't actually full can disrupt the body's automatic signals to store and hold urine longer, which will increase urgency/frequency.  In this case, the bladder "is running the show" and strategies can be learned to retrain this pattern.   One should be able to control the first urge to urinate, at around 126mL.  The bladder can hold up to a "grande latte," or 462mL. To help you gain control, practice the Urge Drill below when urgency strikes.  This drill will help retrain your bladder signals and allow you to store and hold urine longer.  The overall goal is to stretch out your time between voids to reach a more manageable voiding schedule.    Practice your "quick flicks" often throughout the day (each waking hour) even when you don't need feel the urge to go.  This will help strengthen your pelvic floor muscles, making them more effective in controlling leakage.  Urge Drill  When you feel an urge to go, follow these steps to regain control: Stop what you are doing and be still Take one deep breath, directing your air into your abdomen Think an affirming thought, such as "I've got this." Do 5 quick flicks of your pelvic floor Go up and down on heels 5 times Walk with control to the bathroom to void, or delay voiding While walking to the bathroom and get the urge again then repeat as above.   Earlie Counts, PT [facility] [address], [place], [place] [address] W: [phone] [email or web address]

## 2022-06-19 NOTE — Progress Notes (Signed)
Here for self swab. States she is having white discharge and yeasty smell. If it is yeast, she requests pill and repeat in 3 days. If it is  BV she prefers gel. Saw PT last week and had some massage around c/s scar. States she had a little bloody spotting twice that day afterwards. She states she does not spot between periods . States she called the PT and she said ask OB/GYN. Self swab obtained- patient only wants to test for yeast and BV, not STD's. Discussed c/o of spotting after PT with Dr. Currie Paris ; she advised she is not worried it is something dangerous- and recommeds follow up with her end of April/ early May after she has had several sessions of PT to evaluate. Advised to call us if spotting/ bleeding worsens. Patient voices understanding. Staci Acosta

## 2022-06-19 NOTE — Patient Instructions (Signed)

## 2022-06-20 LAB — CERVICOVAGINAL ANCILLARY ONLY
Bacterial Vaginitis (gardnerella): POSITIVE — AB
Candida Glabrata: NEGATIVE
Candida Vaginitis: NEGATIVE
Comment: NEGATIVE
Comment: NEGATIVE
Comment: NEGATIVE

## 2022-06-23 ENCOUNTER — Other Ambulatory Visit: Payer: Self-pay

## 2022-06-23 MED ORDER — METRONIDAZOLE 0.75 % VA GEL
1.0000 | Freq: Two times a day (BID) | VAGINAL | 0 refills | Status: DC
Start: 2022-06-23 — End: 2022-07-16

## 2022-06-23 NOTE — Telephone Encounter (Signed)
Pt called requesting gel for tx for BV sent to [facility] on [address].  Notified pt her request has been sent to her pharmacy.    Jeanettta,RN

## 2022-06-24 ENCOUNTER — Other Ambulatory Visit: Payer: Self-pay | Admitting: Obstetrics and Gynecology

## 2022-06-26 ENCOUNTER — Encounter: Payer: Medicaid Other | Admitting: Physical Therapy

## 2022-07-02 DIAGNOSIS — N9089 Other specified noninflammatory disorders of vulva and perineum: Secondary | ICD-10-CM | POA: Diagnosis not present

## 2022-07-16 ENCOUNTER — Ambulatory Visit (HOSPITAL_COMMUNITY)
Admission: EM | Admit: 2022-07-16 | Discharge: 2022-07-16 | Disposition: A | Discharge status: Home / Self Care | Payer: BC Managed Care – PPO | Attending: Behavioral Health | Admitting: Behavioral Health

## 2022-07-16 ENCOUNTER — Encounter (HOSPITAL_COMMUNITY): Payer: Self-pay | Admitting: Behavioral Health

## 2022-07-16 DIAGNOSIS — F331 Major depressive disorder, recurrent, moderate: Secondary | ICD-10-CM | POA: Diagnosis present

## 2022-07-16 NOTE — ED Provider Notes (Signed)
Behavioral Health Urgent Care Medical Screening Exam  Patient Name: Erin Jacobson MRN: [id number] Date of Evaluation: 07/16/22 Chief Complaint:  "I got a lot on my mind, a lot going on in my life, I just need someone to talk to" Diagnosis:  Final diagnoses:  MDD (major depressive disorder), recurrent episode, moderate   History of Present Illness: Erin Jacobson is a 32 y.o. female patient with a past psychiatric history of depression and anxiety who presented to [facility] voluntarily and unaccompanied for a walk-in assessment with complaints of worsening depression and anxiety.  Patient assessed face-to-face by this provider and chart reviewed on 07/16/22. On evaluation, Erin Jacobson is seated in assessment area in no acute distress. Patient is alert and oriented x4, calm, cooperative, and pleasant. Speech is clear and coherent, normal rate and volume. Patient appears well-groomed. Eye contact is fair. Mood is euthymic with congruent affect. Thought process is coherent with logical thought content. Patient denies current suicidal and homicidal ideations and easily contracts verbally for safety with this Clinical research associate. Patient states she had passive suicidal ideations (no plan or intent) earlier today that have been occurring "on and off for a few weeks." Patient states she had passive homicidal ideations (no plan or intent) earlier today, declines to share who this was towards, however states "I ain't gonna do it." Per triage screening completed by Counselor Beryle Flock, "Pt stated that she "has a lot on me." Pt mentioned work issues and "a health concern" that she recently found out about. Pt denied active SI but stated that she has been having passive SI in the form of "if a car hit me it would be okay." Pt also stated that she has passive thoughts of hurting the person who caused the helath problem. Pt stated she has no plans to hurt anyone but "sometimes I have thoughts. Pt denied NSSH, AVH and paranoia. Pt  reported that for the last 2 weeks (since she has known about the health concern) she has been drinking every other day and smoking cannabis daily. Pt has no curent OP psychiatric providers.and has not had any for about 3-4 years. Pt reported that she has been IVC'd 2 times with the last imtes occurring about 4 years ago due to SI." Patient reports recently increased symptoms of anxiety and depression and states "I got a lot on my mind, a lot going on in my life, I just need someone to talk to." Patient reports a history of 2 past suicide attempts (in 2020 and between 2017-2018 by overdosing on medication). Patient denies a history of self-harm. Patient reports 2 past psychiatric hospitalizations (in 2020 at [facility] and between 2017-2018 at [facility] following her suicide attempts). Patient denies auditory and visual hallucinations. Patient denies symptoms of paranoia. Patient is able to converse coherently with goal-directed thoughts and no distractibility or preoccupation. Objectively, there is no evidence of psychosis/mania, delusional thinking, or indication that patient is responding to internal or external stimuli.  Patient endorses fair sleep and fair appetite. Patient lives in [place] with her mother and oldest son and is employed at [employer]. Patient denies access to weapons. Patient endorses alcohol use of 1 "small bottle" of wine per week. Patient denies use of other illicit substances. Patient states she does not currently have outpatient psychiatric services in place for therapy or medication management but is interested in starting these services again. Patient states she used to take Zoloft, but stopped in 2021 or 2022 because she stopped going to  her appointments. Patient is requesting therapy and medication management resources.  Patient offered support and encouragement. Discussed following up with outpatient psychiatric resources provided in AVS for therapy and medication management.  Patient is in agreement with plan of care.  At this time, Erin Jacobson is educated and verbalizes understanding of mental health resources and other crisis services in the community. She is instructed to call 911 and present to the nearest emergency room should she experience any suicidal/homicidal ideation, auditory/visual/hallucinations, or detrimental worsening of her mental health condition. She was also advised by Clinical research associate that she could call the toll-free phone on back of  insurance card to assist with identifying in network services and agencies or the number on back of Medicaid card to speak with care coordinator.   Flowsheet Row ED from 07/16/2022 in [facility] ED from 06/02/2022 in [facility] Emergency Department at [facility] ED from 03/22/2022 in [facility] at [facility])  C-SSRS RISK CATEGORY Low Risk No Risk No Risk       Psychiatric Specialty Exam  Presentation  General Appearance:Appropriate for Environment; Casual; Well Groomed  Eye Contact:Fair  Speech:Clear and Coherent; Normal Rate  Speech Volume:Normal  Handedness:Right   Mood and Affect  Mood: Euthymic  Affect: Congruent   Thought Process  Thought Processes: Coherent; Goal Directed  Descriptions of Associations:Intact  Orientation:Full (Time, Place and Person)  Thought Content:Logical    Hallucinations:None  Ideas of Reference:None  Suicidal Thoughts:No  Homicidal Thoughts:No   Sensorium  Memory: Immediate Good; Recent Good; Remote Good  Judgment: Fair  Insight: Fair   Chartered certified accountant: Fair  Attention Span: Fair  Recall: [employer] of Knowledge: Fair  Language: Fair   Psychomotor Activity  Psychomotor Activity: Normal   Assets  Assets: Manufacturing systems engineer; Desire for Improvement; Financial Resources/Insurance; Housing; Physical Health; Resilience; Social Support;  Transportation   Sleep  Sleep: Fair  Number of hours:  6   Physical Exam: Physical Exam Vitals and nursing note reviewed.  Constitutional:      General: She is not in acute distress.    Appearance: Normal appearance. She is not ill-appearing.  HENT:     Head: Normocephalic and atraumatic.     Nose: Nose normal.  Eyes:     General:        Right eye: No discharge.        Left eye: No discharge.     Conjunctiva/sclera: Conjunctivae normal.  Cardiovascular:     Rate and Rhythm: Normal rate.  Pulmonary:     Effort: Pulmonary effort is normal. No respiratory distress.  Musculoskeletal:        General: Normal range of motion.     Cervical back: Normal range of motion.  Skin:    General: Skin is warm and dry.  Neurological:     General: No focal deficit present.     Mental Status: She is alert and oriented to person, place, and time. Mental status is at baseline.  Psychiatric:        Attention and Perception: Attention and perception normal.        Mood and Affect: Mood and affect normal.        Speech: Speech normal.        Behavior: Behavior normal. Behavior is cooperative.        Thought Content: Thought content normal. Thought content is not paranoid or delusional. Thought content does not include homicidal or suicidal ideation.  Thought content does not include homicidal or suicidal plan.        Cognition and Memory: Cognition and memory normal.        Judgment: Judgment normal.    Review of Systems  Constitutional: Negative.   HENT: Negative.    Eyes: Negative.   Respiratory: Negative.    Cardiovascular: Negative.   Gastrointestinal: Negative.   Genitourinary: Negative.   Musculoskeletal: Negative.   Skin: Negative.   Neurological: Negative.   Endo/Heme/Allergies: Negative.   Psychiatric/Behavioral:  Positive for depression. Negative for hallucinations, memory loss and suicidal ideas. The patient is not nervous/anxious and does not have insomnia.    Blood  pressure (!) 138/96, pulse 100, temperature 98 F (36.7 C), temperature source Oral, resp. rate 18, SpO2 100 %. There is no height or weight on file to calculate BMI.  Musculoskeletal: Strength & Muscle Tone: within normal limits Gait & Station: normal Patient leans: N/A   [facility] MSE Discharge Disposition for Follow up and Recommendations: Based on my evaluation the patient does not appear to have an emergency medical condition and can be discharged with resources and follow up care in outpatient services for Medication Management and Individual Therapy   Sunday Corn, NP 07/16/2022, 5:40 PM

## 2022-07-16 NOTE — Progress Notes (Signed)
   07/16/22 1547  [facility] Triage Screening (Walk-ins at [facility] only)  How Did You Hear About [place]? Self  What Is the Reason for Your Visit/Call Today? Pt is a 32 yo female who presented voluntarily and unaccompanied due to worsening depression and anxiety. Pt stated that she "has a lot on me." Pt mentioned work issues and "a health concern" that she recently found out about. Pt denied active SI but stated that she has been having passive SI in the form of "if a car hit me it would be okay." Pt also stated that she has passive thoughts of hurting the person who caused the helath problem. Pt stated she has no plans to hurt anyone but "sometimes I have thoughts." Pt denied NSSH, AVH and paranoia. Pt reported that for the last 2 weeks (since she has known about the health concern) she has been drinking every other day and smoking cannabis daily. Pt has no curent OP psychiatric providers.and has not had any for about 3-4 years. Pt reported that she has been IVC'd 2 times with the last imtes occurring about 4 years ago due to SI.  How Long Has This Been Causing You Problems? 1 wk - 1 month  Have You Recently Had Any Thoughts About Hurting Yourself? No  Are You Planning to Commit Suicide/Harm Yourself At This time? No  Have you Recently Had Thoughts About Hurting Someone Karolee Ohs? Yes  How long ago did you have thoughts of harming others? several days ago  Are You Planning To Harm Someone At This Time? No  Are you currently experiencing any auditory, visual or other hallucinations? No  Have You Used Any Alcohol or Drugs in the Past 24 Hours? Yes  How long ago did you use Drugs or Alcohol? cannabis and wine yesterday  What Did You Use and How Much? unknown amount  Do you have any current medical co-morbidities that require immediate attention?  (unknown medical concern)  Clinician description of patient physical appearance/behavior: tearful, anxious, calm, cooperative, alert, oriented. depressed mood and flat affect.  good insight and fair judgment. dressed casually and neatly.  What Do You Feel Would Help You the Most Today? Treatment for Depression or other mood problem  If access to [facility] was not available, would you have sought care in the Emergency Department? Yes  Determination of Need Routine (7 days)  Options For Referral Outpatient Therapy;Medication Management

## 2022-07-16 NOTE — Discharge Instructions (Addendum)
Based on what you have shared, a list of resources for outpatient therapy and psychiatry is provided below to get you started back on treatment.  It is imperative that you follow through with treatment within 5-7 days from the day of discharge to prevent any further risk to your safety or mental well-being.  You are not limited to the list provided.  In case of an urgent crisis, you may contact the Mobile Crisis Unit with [facility] at [phone].  Please contact one of the following facilities to start medication management and therapy services:   [facility] [address] [place]  [place], [place] [address] [phone]   [facility]  [address] [place], [place] [address] [phone]  [facility] [place]  [address] Vella R[address] [place], [place] [address] [phone]  [facility]  [address]  [place], [place] [address] [phone]  [facility]  [address] [place], [place] [address] [phone]  [facility]  [address] Renee Rival  [place], [place] [address] [phone]   Outpatient Services for Therapy and Medication Management for [facility] [address], [place], [address] [phone] phone  New Patient Assessment/Therapy Walk-ins Monday and Wednesday: 8am until slots are full. Every 1st and 2nd Friday: 1pm - 5pm  NO ASSESSMENT/THERAPY WALK-INS ON TUESDAYS OR THURSDAYS  New Patient Psychiatry/Medication Management Walk-ins Monday-Friday: 8am-11am  For all walk-ins, we ask that you arrive by 7:30am because patient will be seen in the order of arrival.  Availability is limited; therefore, you may not be seen on the same day that you walk-in.  Our goal is to serve and meet the needs of our community to the best of our ability.   [facility] [address] [place], [place], [address] [phone] phone  [facility] [address] [place], [place], [address] [phone] phone [phone] phone ([address], [address], Anthem/Elevance, [address], [address], [address], [address], Healthy [place], [place], [place], [address], [employer]oco[employer]ill[employer], Phil Campbell, UHC, [employer] Financial, [place], Out of Network)  [employer], [place] [address] [place], [place], [address] [phone] phone ([place], Anthem/Elevance, Sanmina-SCI Options/Carelon, BCBS, [address], [place], [place], [place], [place], [employer], [place], [place], [place], [facility])  [facility] [address] [place], [place], [address] [phone] phone (Medicaid, ask about other insurance)  [facility] [address] [place], [place], [address] [phone] phone [phone] fax ([address], [place] , [place], [place], [place] Health Choice, UHC, General Electric, Self-Pay)  [facility] [address] [place], [place], [address] [phone] phone ([address] Anthem/Elevance, [address], [address], [place], [employer]X [employer], [place], [place], [place], [employer], [place], [place], [place], [facility])  [facility] - 6-8 MONTH WAIT FOR THERAPY; [facility] [address] [place], [place], [address] [phone] phone ([address], AmeriHealth [address] - [place], [address], [place], [place], Friday Health Plans, [address], BCBS Healthy [place], [place], [address], [place], [place], [place], [place], Tricare, UHC, Safeco Corporation, [place])  [facility] [address], [address] [place], [place], [address] [phone] phone  [facility] [address] [place], [place], [address] [phone] phone  [facility] [address] [place], [place],  [address] [phone] phone  [facility] TH[facility]AP[facility] ONLY [address], [place], [address] [phone] phone  [facility] [place] [address], [place], [address] [phone] phone  [facility] [address] [place], [place], [address] [phone] phone [phone] fax  [facility] [address] [place], [place], [address] [phone] phone [phone] fax  [facility] [phone] [address] [place], [place], [address] [phone] phone  Jovita Kussmaul [address] [place], [place], [address]  [phone] phone  [facility]  (Adults Only) [address] [place], [place], [address]  [phone] phone [phone] fax   [facility]: Outpatient psychiatric Services  New Patient Assessment and Therapy Walk-in Monday thru Thursday 8:00 am first come first serve until slots are full Every Friday from 1:00 pm to 4:00 pm first come first serve until slots are full  New Patient Psychiatric Medication Management Monday thru Friday from 8:00 am to 11:00 am first come first served until slots are full  For all walk-ins we ask that you arrive by 7:15 am because patients will be seen in there order of arrival.   Availability is limited, and therefore you may not be seen on the same day that you walk in.  Our goal is to serve and meet the needs of our community to the best of our ability.

## 2022-08-04 ENCOUNTER — Encounter: Payer: Self-pay | Admitting: Obstetrics and Gynecology

## 2022-08-04 ENCOUNTER — Ambulatory Visit: Payer: BC Managed Care – PPO | Admitting: Obstetrics and Gynecology

## 2022-08-04 DIAGNOSIS — N926 Irregular menstruation, unspecified: Secondary | ICD-10-CM

## 2022-08-04 DIAGNOSIS — F40298 Other specified phobia: Secondary | ICD-10-CM

## 2022-08-04 HISTORY — DX: Irregular menstruation, unspecified: N92.6

## 2022-08-04 HISTORY — DX: Other specified phobia: F40.298

## 2022-08-07 ENCOUNTER — Encounter: Payer: BC Managed Care – PPO | Attending: Obstetrics and Gynecology | Admitting: Physical Therapy

## 2022-08-07 ENCOUNTER — Telehealth: Payer: Self-pay | Admitting: Physical Therapy

## 2022-08-07 DIAGNOSIS — R102 Pelvic and perineal pain: Secondary | ICD-10-CM | POA: Insufficient documentation

## 2022-08-07 DIAGNOSIS — R252 Cramp and spasm: Secondary | ICD-10-CM | POA: Insufficient documentation

## 2022-08-07 NOTE — Telephone Encounter (Signed)
Spoke to patient on the phone about her missed appointment today at 10:30. She did not realize the appointment was in the morning and needs afternoons. Therapist let patient know about the attendance policy and that 2 no shows we will have to discharge her.  Eulis Foster, PT @5 /16/24@ 10:51 AM

## 2022-08-14 ENCOUNTER — Encounter: Payer: BC Managed Care – PPO | Admitting: Physical Therapy

## 2022-08-16 ENCOUNTER — Ambulatory Visit (HOSPITAL_COMMUNITY)
Admission: EM | Admit: 2022-08-16 | Discharge: 2022-08-16 | Disposition: A | Discharge status: Home / Self Care | Payer: BC Managed Care – PPO | Attending: Internal Medicine | Admitting: Internal Medicine

## 2022-08-16 ENCOUNTER — Encounter (HOSPITAL_COMMUNITY): Payer: Self-pay | Admitting: Emergency Medicine

## 2022-08-16 DIAGNOSIS — N898 Other specified noninflammatory disorders of vagina: Secondary | ICD-10-CM | POA: Diagnosis not present

## 2022-08-16 MED ORDER — FLUCONAZOLE 150 MG PO TABS: 150.0000 mg | ORAL_TABLET | ORAL | 0 refills | Status: AC

## 2022-08-16 NOTE — ED Triage Notes (Signed)
Pt reports for about 3-4 days having white vaginal discharge and "yeast odor". Requesting testing for BV and/or yeast.

## 2022-08-16 NOTE — Discharge Instructions (Addendum)
Take diflucan 1 pill today, then again in 3 days to treat suspected vaginal yeast infection.   We will treat for any other positive results when your labs come back. If you do not hear from [place], this means that your STI testing was negative or there is no change to your treatment plan. You will also receive these results via MyChart.   Return if you experience fevers 100.4 or greater, worsening or uncontrolled pain, rashes, sores, vomiting, or for any other concerning symptoms.

## 2022-08-16 NOTE — ED Provider Notes (Signed)
[facility]    CSN: [id number] Arrival date & time: 08/16/22  1539      History   Chief Complaint Chief Complaint  Patient presents with   Vaginal Discharge    HPI Erin Jacobson is a 32 y.o. female.   Patient presents to urgent care for evaluation of thick white vaginal discharge and vaginal itching for the last 3 to 4 days.  She is concerned that she may have a vaginal yeast infection and states that she gets these frequently.  Reports mild vaginal odor as well but states that it "does not smell like BV".  She does state that she recently used a new type of Dove soap to her vaginal area than her normal soap recently and believes that this may have caused some vaginal irritation.  She is not diabetic and denies use of SGLT2 inhibitor.  No recent antibiotic or steroid use.  Denies urinary symptoms, nausea, vomiting, diarrhea, abdominal pain, fever, chills, and dizziness.  No concern for STD.  She is not in a monogamous relationship with her female partner.  Last menstrual cycle July 22, 2022.   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 lap.   CESAREAN SECTION N/A 06/11/2015   Procedure: CESAREAN SECTION;  Surgeon: Marlow Baars, MD;  Location: [facility];  Service: Obstetrics;  Laterality: N/A;   CYSTOSCOPY WITH RETROGRADE PYELOGRAM, URETEROSCOPY AND STENT PLACEMENT Left 05/05/2014   Procedure: LEFT  URETEROSCOPY,  RETROGRADE PYELOGRAM,  AND STENT PLACEMENT, STONE REMOVAL;  Surgeon: Crist Fat, MD;  Location: [facility];  Service: Urology;  Laterality: Left;   HOLMIUM LASER APPLICATION Left 05/05/2014   Procedure: HOLMIUM LASER APPLICATION;  Surgeon: Crist Fat, MD;  Location: [facility];  Service: Urology;  Laterality: Left;   KNEE SURGERY     LAPAROSCOPIC TUBAL LIGATION Bilateral 09/29/2018   Procedure: LAPAROSCOPIC TUBAL LIGATION WITH FILSHIE CLIPS;  Surgeon: Allie Bossier, MD;  Location: [facility];  Service: Gynecology;  Laterality: Bilateral;    OB History     Gravida  3   Para  3   Term  2   Preterm  1   AB      Living  3      SAB      IAB      Ectopic      Multiple  0   Live Births  3            Home Medications  Prior to Admission medications   Medication Sig Start Date End Date Taking? Authorizing Provider  fluconazole (DIFLUCAN) 150 MG tablet Take 1 tablet (150 mg total) by mouth every 3 (three) days for 2 doses. 08/16/22 08/20/22 Yes Wenona [place], Donavan Burnet, FNP  diphenhydrAMINE (BENADRYL ALLERGY) 25 MG tablet 1 tablet at bedtime as needed Orally Once a day    [provider]  mupirocin ointment (BACTROBAN) 2 % 1 application to affected area Externally three times a day 07/02/22   [provider]  valACYclovir (VALTREX) 1000 MG tablet Take 1,000 mg by mouth 2 (two) times daily. 07/06/22   [provider]  albuterol (PROVENTIL HFA;VENTOLIN HFA) 108 (90 Base) MCG/ACT inhaler Inhale 2 puffs into the lungs every 6 (six) hours as needed for wheezing or shortness of  breath. Patient not taking: Reported on 04/12/2020 05/28/18 04/20/20  Katrinka Blazing, [place], CNM  azelastine (ASTELIN) 0.1 % nasal spray Place 2 sprays into both nostrils 2 (two) times daily. Patient not taking: No sig reported 08/07/19 04/20/20  Belinda Fisher, PA-C    Family History Family History  Problem Relation Age of Onset   Anesthesia problems Mother        post-op N/V, hard to wake up   Arthritis Mother    Cancer Maternal Grandfather     Social History Social History   Tobacco Use   Smoking status: Some Days    Types: Cigars    Last attempt to quit: 09/24/2014    Years since quitting: 7.8   Smokeless tobacco: Never  Vaping Use   Vaping Use: Never used  Substance Use Topics   Alcohol use: Yes   Drug use: No     Allergies   Amoxicillin   Review of Systems Review of Systems  Genitourinary:  Positive for vaginal discharge.  Per HPI   Physical Exam Triage Vital Signs ED Triage Vitals  Enc Vitals Group     BP 08/16/22 1547 122/73     Pulse Rate 08/16/22 1547 96     Resp 08/16/22 1547 15     Temp 08/16/22 1547 98.5 F (36.9 C)     Temp Source 08/16/22 1547 Oral     SpO2 08/16/22 1547 96 %     Weight --      Height --      Head Circumference --      Peak Flow --      Pain Score 08/16/22 1546 0     Pain Loc --      Pain Edu? --      Excl. in GC? --    No data found.  Updated Vital Signs BP 122/73 (BP Location: Right Arm)   Pulse 96   Temp 98.5 F (36.9 C) (Oral)   Resp 15   LMP 07/22/2022   SpO2 96%   Visual Acuity Right Eye Distance:   Left Eye Distance:   Bilateral Distance:    Right Eye Near:   Left Eye Near:    Bilateral Near:     Physical Exam Vitals and nursing note reviewed.  Constitutional:      Appearance: She is not ill-appearing or toxic-appearing.  HENT:     Head: Normocephalic and atraumatic.     Right Ear: Hearing and external ear normal.     Left Ear: Hearing and external ear normal.     Nose: Nose normal.     Mouth/Throat:      Lips: Pink.  Eyes:     General: Lids  are normal. Vision grossly intact. Gaze aligned appropriately.     Extraocular Movements: Extraocular movements intact.     Conjunctiva/sclera: Conjunctivae normal.  Pulmonary:     Effort: Pulmonary effort is normal.  Genitourinary:    Comments: Deferred. Musculoskeletal:     Cervical back: Neck supple.  Skin:    General: Skin is warm and dry.     Capillary Refill: Capillary refill takes less than 2 seconds.     Findings: No rash.  Neurological:     General: No focal deficit present.     Mental Status: She is alert and oriented to person, place, and time. Mental status is at baseline.     Cranial Nerves: No dysarthria or facial asymmetry.  Psychiatric:        Mood and Affect: Mood normal.        Speech: Speech normal.        Behavior: Behavior normal.        Thought Content: Thought content normal.        Judgment: Judgment normal.      UC Treatments / Results  Labs (all labs ordered are listed, but only abnormal results are displayed) Labs Reviewed  CERVICOVAGINAL ANCILLARY ONLY    EKG   Radiology No results found.  Procedures Procedures (including critical care time)  Medications Ordered in UC Medications - No data to display  Initial Impression / Assessment and Plan / UC Course  I have reviewed the triage vital signs and the nursing notes.  Pertinent labs & imaging results that were available during my care of the patient were reviewed by me and considered in my medical decision making (see chart for details).   1.  Vaginal itching Presentation is consistent with acute yeast vaginitis, therefore will initiate treatment with Diflucan once today, then again in 3 days.  Site of swab is pending and will treat for all other positive results when vaginal swab returns.  Patient is requesting treatment with MetroGel should she test positive for bacterial vaginitis and states that "it is hard for her to remember to take  pills".  Discussed physical exam and available lab work findings in clinic with patient.  Counseled patient regarding appropriate use of medications and potential side effects for all medications recommended or prescribed today. Discussed red flag signs and symptoms of worsening condition,when to call the PCP office, return to urgent care, and when to seek higher level of care in the emergency department. Patient verbalizes understanding and agreement with plan. All questions answered. Patient discharged in stable condition.    Final Clinical Impressions(s) / UC Diagnoses   Final diagnoses:  Vaginal itching     Discharge Instructions      Take diflucan 1 pill today, then again in 3 days to treat suspected vaginal yeast infection.   We will treat for any other positive results when your labs come back. If you do not hear from [place], this means that your STI testing was negative or there is no change to your treatment plan. You will also receive these results via MyChart.   Return if you experience fevers 100.4 or greater, worsening or uncontrolled pain, rashes, sores, vomiting, or for any other concerning symptoms.      ED Prescriptions     Medication Sig Dispense Auth. Provider   fluconazole (DIFLUCAN) 150 MG tablet Take 1 tablet (150 mg total) by mouth every 3 (three) days for 2 doses. 2 tablet Carlisle Beers, FNP  PDMP not reviewed this encounter.   Carlisle Beers, [place] 08/16/22 1609

## 2022-08-19 LAB — CERVICOVAGINAL ANCILLARY ONLY
Bacterial Vaginitis (gardnerella): POSITIVE — AB
Candida Glabrata: NEGATIVE
Candida Vaginitis: POSITIVE — AB
Chlamydia: NEGATIVE
Comment: NEGATIVE
Comment: NEGATIVE
Comment: NEGATIVE
Comment: NEGATIVE
Comment: NEGATIVE
Comment: NORMAL
Neisseria Gonorrhea: NEGATIVE
Trichomonas: NEGATIVE

## 2022-08-20 ENCOUNTER — Telehealth (HOSPITAL_COMMUNITY): Payer: Self-pay | Admitting: Emergency Medicine

## 2022-08-20 MED ORDER — METRONIDAZOLE 0.75 % VA GEL: 1.0000 | Freq: Every day | VAGINAL | 0 refills | Status: AC

## 2022-08-20 MED ORDER — METRONIDAZOLE 500 MG PO TABS: 500.0000 mg | ORAL_TABLET | Freq: Two times a day (BID) | ORAL | 0 refills | Status: DC

## 2022-08-20 NOTE — Telephone Encounter (Signed)
Follow up call to patient, she preferred metrogel for BV treatment, sent

## 2022-08-28 ENCOUNTER — Encounter: Payer: BC Managed Care – PPO | Admitting: Physical Therapy

## 2022-09-23 ENCOUNTER — Encounter: Payer: BC Managed Care – PPO | Attending: Obstetrics and Gynecology | Admitting: Physical Therapy

## 2022-09-23 ENCOUNTER — Telehealth: Payer: Self-pay | Admitting: Physical Therapy

## 2022-09-23 DIAGNOSIS — R102 Pelvic and perineal pain: Secondary | ICD-10-CM | POA: Insufficient documentation

## 2022-09-23 DIAGNOSIS — R252 Cramp and spasm: Secondary | ICD-10-CM | POA: Insufficient documentation

## 2022-09-23 NOTE — Telephone Encounter (Signed)
Called patient about her missed appointment today at 13:00. Left a message.  Eulis Foster, PT @7 /2/24@ 1:28 PM

## 2022-10-02 ENCOUNTER — Other Ambulatory Visit: Payer: Self-pay

## 2022-10-02 ENCOUNTER — Ambulatory Visit
Admission: EM | Admit: 2022-10-02 | Discharge: 2022-10-02 | Disposition: A | Discharge status: Home / Self Care | Payer: BC Managed Care – PPO | Attending: Family Medicine | Admitting: Family Medicine

## 2022-10-02 ENCOUNTER — Encounter: Payer: Self-pay | Admitting: Emergency Medicine

## 2022-10-02 DIAGNOSIS — B9689 Other specified bacterial agents as the cause of diseases classified elsewhere: Secondary | ICD-10-CM | POA: Insufficient documentation

## 2022-10-02 DIAGNOSIS — N760 Acute vaginitis: Secondary | ICD-10-CM | POA: Insufficient documentation

## 2022-10-02 MED ORDER — METRONIDAZOLE 0.75 % VA GEL: 1.0000 | Freq: Two times a day (BID) | VAGINAL | 0 refills | Status: DC

## 2022-10-02 MED ORDER — FLUCONAZOLE 150 MG PO TABS: 150.0000 mg | ORAL_TABLET | Freq: Every day | ORAL | 1 refills | Status: DC

## 2022-10-02 NOTE — ED Triage Notes (Signed)
Patient c/o fishy vaginal discharge x 3 days.  Notices after her menstrual cycle is complete.  Concern for BV and yeast infection.  Denies any OTC meds.

## 2022-10-02 NOTE — Discharge Instructions (Signed)
Take the probiotic FOR WOMEN every day  I have prescribed the metronidazole vaginal gel and diflucan I gave a refill on diflucan if needed

## 2022-10-02 NOTE — ED Provider Notes (Signed)
[facility]    CSN: [id number] Arrival date & time: 10/02/22  1648      History   Chief Complaint Chief Complaint  Patient presents with   Vaginal Discharge    HPI Erin Jacobson is a 32 y.o. female.   HPI  Pleasant 32 year old.  States she has recurring PVCs.  Usually has it with every cycle.  Usually has yeast infections with it.  She states she currently just finished her menstrual cycle, has vaginal itching, discharge, and odor.  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 lap.   CESAREAN SECTION N/A 06/11/2015   Procedure: CESAREAN SECTION;  Surgeon: Marlow Baars, MD;  Location: [facility];  Service: Obstetrics;  Laterality: N/A;   CYSTOSCOPY WITH RETROGRADE PYELOGRAM, URETEROSCOPY AND STENT PLACEMENT Left 05/05/2014   Procedure: LEFT  URETEROSCOPY,  RETROGRADE PYELOGRAM,  AND STENT PLACEMENT, STONE REMOVAL;  Surgeon: Crist Fat, MD;  Location: [facility];  Service: Urology;  Laterality: Left;   HOLMIUM LASER APPLICATION Left 05/05/2014   Procedure: HOLMIUM LASER APPLICATION;   Surgeon: Crist Fat, MD;  Location: [facility];  Service: Urology;  Laterality: Left;   KNEE SURGERY     LAPAROSCOPIC TUBAL LIGATION Bilateral 09/29/2018   Procedure: LAPAROSCOPIC TUBAL LIGATION WITH FILSHIE CLIPS;  Surgeon: Allie Bossier, MD;  Location: [facility];  Service: Gynecology;  Laterality: Bilateral;    OB History     Gravida  3   Para  3   Term  2   Preterm  1   AB      Living  3      SAB      IAB      Ectopic      Multiple  0   Live Births  3            Home Medications    Prior to Admission medications   Medication Sig Start Date End Date Taking? Authorizing Provider  fluconazole (DIFLUCAN) 150 MG tablet Take 1 tablet (150 mg total) by mouth daily. Repeat in 1 week if needed 10/02/22  Yes Eustace Moore, MD  metroNIDAZOLE (METROGEL) 0.75 % vaginal gel Place 1 Applicatorful vaginally 2 (two) times daily. 10/02/22  Yes Eustace Moore, MD  valACYclovir (VALTREX) 1000 MG tablet Take 1,000 mg by mouth 2 (two) times daily. 07/06/22   [provider]  albuterol (PROVENTIL HFA;VENTOLIN HFA) 108 (90 Base) MCG/ACT inhaler Inhale 2 puffs into the lungs every 6 (six) hours as  needed for wheezing or shortness of breath. Patient not taking: Reported on 04/12/2020 05/28/18 04/20/20  Katrinka Blazing, [place], CNM  azelastine (ASTELIN) 0.1 % nasal spray Place 2 sprays into both nostrils 2 (two) times daily. Patient not taking: No sig reported 08/07/19 04/20/20  Belinda Fisher, PA-C    Family History Family History  Problem Relation Age of Onset   Anesthesia problems Mother        post-op N/V, hard to wake up   Arthritis Mother    Cancer Maternal Grandfather     Social History Social History   Tobacco Use   Smoking status: Some Days    Types: Cigars    Last attempt to quit: 09/24/2014    Years since quitting: 8.0   Smokeless tobacco: Never  Vaping Use   Vaping status: Never Used  Substance Use Topics   Alcohol use: Yes   Drug use: No      Allergies   Amoxicillin   Review of Systems Review of Systems See HPI  Physical Exam Triage Vital Signs ED Triage Vitals  Encounter Vitals Group     BP 10/02/22 1717 119/75     Systolic BP Percentile --      Diastolic BP Percentile --      Pulse Rate 10/02/22 1717 72     Resp 10/02/22 1717 16     Temp 10/02/22 1717 99.2 F (37.3 C)     Temp Source 10/02/22 1717 Oral     SpO2 10/02/22 1717 100 %     Weight 10/02/22 1718 142 lb (64.4 kg)     Height 10/02/22 1718 5' (1.524 m)     Head Circumference --      Peak Flow --      Pain Score 10/02/22 1718 0     Pain Loc --      Pain Education --      Exclude from Growth Chart --    No data found.  Updated Vital Signs BP 119/75 (BP Location: Right Arm)   Pulse 72   Temp 99.2 F (37.3 C) (Oral)   Resp 16   Ht 5' (1.524 m)   Wt 64.4 kg   LMP 09/17/2022   SpO2 100%   BMI 27.73 kg/m       Physical Exam Constitutional:      General: She is not in acute distress.    Appearance: She is well-developed.  HENT:     Head: Normocephalic and atraumatic.  Eyes:     Conjunctiva/sclera: Conjunctivae normal.     Pupils: Pupils are equal, round, and reactive to light.  Cardiovascular:     Rate and Rhythm: Normal rate.  Pulmonary:     Effort: Pulmonary effort is normal. No respiratory distress.  Abdominal:     General: There is no distension.     Palpations: Abdomen is soft.  Musculoskeletal:        General: Normal range of motion.     Cervical back: Normal range of motion.  Skin:    General: Skin is warm and dry.  Neurological:     Mental Status: She is alert.      UC Treatments / Results  Labs (all labs ordered are listed, but only abnormal results are displayed) Labs Reviewed  CERVICOVAGINAL ANCILLARY ONLY    EKG   Radiology No results found.  Procedures Procedures (including critical care time)  Medications Ordered in UC Medications - No data to display  Initial Impression / Assessment and  Plan / UC Course  I have reviewed the triage vital signs and the nursing notes.  Pertinent labs & imaging results that were available during my care of the patient were reviewed by me and considered in my medical decision making (see chart for details).    Swab obtained.  BV and yeast infection prevention discussed Final Clinical Impressions(s) / UC Diagnoses   Final diagnoses:  BV (bacterial vaginosis)  Acute vaginitis     Discharge Instructions      Take the probiotic FOR WOMEN every day  I have prescribed the metronidazole vaginal gel and diflucan I gave a refill on diflucan if needed   ED Prescriptions     Medication Sig Dispense Auth. Provider   metroNIDAZOLE (METROGEL) 0.75 % vaginal gel Place 1 Applicatorful vaginally 2 (two) times daily. 70 g Eustace Moore, MD   fluconazole (DIFLUCAN) 150 MG tablet Take 1 tablet (150 mg total) by mouth daily. Repeat in 1 week if needed 2 tablet Eustace Moore, MD      PDMP not reviewed this encounter.   Eustace Moore, MD 10/02/22 Flossie Buffy

## 2022-10-06 LAB — CERVICOVAGINAL ANCILLARY ONLY
Bacterial Vaginitis (gardnerella): POSITIVE — AB
Candida Glabrata: NEGATIVE
Candida Vaginitis: POSITIVE — AB
Chlamydia: NEGATIVE
Comment: NEGATIVE
Comment: NEGATIVE
Comment: NEGATIVE
Comment: NEGATIVE
Comment: NEGATIVE
Comment: NORMAL
Neisseria Gonorrhea: NEGATIVE
Trichomonas: NEGATIVE

## 2022-11-10 ENCOUNTER — Encounter (HOSPITAL_COMMUNITY): Payer: Self-pay | Admitting: Emergency Medicine

## 2022-11-10 ENCOUNTER — Emergency Department (HOSPITAL_COMMUNITY)
Admission: EM | Admit: 2022-11-10 | Discharge: 2022-11-12 | Disposition: A | Discharge status: Home / Self Care | Payer: BC Managed Care – PPO | Attending: Emergency Medicine | Admitting: Emergency Medicine

## 2022-11-10 ENCOUNTER — Other Ambulatory Visit: Payer: Self-pay

## 2022-11-10 DIAGNOSIS — F331 Major depressive disorder, recurrent, moderate: Secondary | ICD-10-CM | POA: Diagnosis present

## 2022-11-10 DIAGNOSIS — Z1152 Encounter for screening for COVID-19: Secondary | ICD-10-CM | POA: Diagnosis not present

## 2022-11-10 DIAGNOSIS — R461 Bizarre personal appearance: Secondary | ICD-10-CM | POA: Diagnosis not present

## 2022-11-10 DIAGNOSIS — F329 Major depressive disorder, single episode, unspecified: Secondary | ICD-10-CM | POA: Insufficient documentation

## 2022-11-10 DIAGNOSIS — R456 Violent behavior: Secondary | ICD-10-CM | POA: Insufficient documentation

## 2022-11-10 DIAGNOSIS — R4689 Other symptoms and signs involving appearance and behavior: Secondary | ICD-10-CM

## 2022-11-10 DIAGNOSIS — R9431 Abnormal electrocardiogram [ECG] [EKG]: Secondary | ICD-10-CM | POA: Diagnosis not present

## 2022-11-10 LAB — CBC WITH DIFFERENTIAL/PLATELET
Abs Immature Granulocytes: 0.02 10*3/uL (ref 0.00–0.07)
Basophils Absolute: 0.1 10*3/uL (ref 0.0–0.1)
Basophils Relative: 1 %
Eosinophils Absolute: 0.3 10*3/uL (ref 0.0–0.5)
Eosinophils Relative: 4 %
HCT: 35.9 % — ABNORMAL LOW (ref 36.0–46.0)
Hemoglobin: 11.3 g/dL — ABNORMAL LOW (ref 12.0–15.0)
Immature Granulocytes: 0 %
Lymphocytes Relative: 33 %
Lymphs Abs: 2.5 10*3/uL (ref 0.7–4.0)
MCH: 25.9 pg — ABNORMAL LOW (ref 26.0–34.0)
MCHC: 31.5 g/dL (ref 30.0–36.0)
MCV: 82.3 fL (ref 80.0–100.0)
Monocytes Absolute: 0.5 10*3/uL (ref 0.1–1.0)
Monocytes Relative: 6 %
Neutro Abs: 4.1 10*3/uL (ref 1.7–7.7)
Neutrophils Relative %: 56 %
Platelets: 348 10*3/uL (ref 150–400)
RBC: 4.36 MIL/uL (ref 3.87–5.11)
RDW: 15.4 % (ref 11.5–15.5)
WBC: 7.4 10*3/uL (ref 4.0–10.5)
nRBC: 0 % (ref 0.0–0.2)

## 2022-11-10 LAB — URINALYSIS, ROUTINE W REFLEX MICROSCOPIC
Bilirubin Urine: NEGATIVE
Glucose, UA: NEGATIVE mg/dL
Ketones, ur: NEGATIVE mg/dL
Leukocytes,Ua: NEGATIVE
Nitrite: NEGATIVE
Protein, ur: NEGATIVE mg/dL
Specific Gravity, Urine: 1.01 (ref 1.005–1.030)
pH: 5 (ref 5.0–8.0)

## 2022-11-10 LAB — COMPREHENSIVE METABOLIC PANEL
ALT: <5 U/L (ref 0–44)
AST: 17 U/L (ref 15–41)
Albumin: 4 g/dL (ref 3.5–5.0)
Alkaline Phosphatase: 59 U/L (ref 38–126)
Anion gap: 9 (ref 5–15)
BUN: 13 mg/dL (ref 6–20)
CO2: 22 mmol/L (ref 22–32)
Calcium: 8.9 mg/dL (ref 8.9–10.3)
Chloride: 106 mmol/L (ref 98–111)
Creatinine, Ser: 0.68 mg/dL (ref 0.44–1.00)
GFR, Estimated: >60 mL/min (ref >60)
Glucose, Bld: 90 mg/dL (ref 70–99)
Potassium: 3.8 mmol/L (ref 3.5–5.1)
Sodium: 137 mmol/L (ref 135–145)
Total Bilirubin: 0.4 mg/dL (ref 0.3–1.2)
Total Protein: 8 g/dL (ref 6.5–8.1)

## 2022-11-10 LAB — RAPID URINE DRUG SCREEN, HOSP PERFORMED
Amphetamines: NOT DETECTED
Barbiturates: NOT DETECTED
Benzodiazepines: NOT DETECTED
Cocaine: NOT DETECTED
Opiates: NOT DETECTED
Tetrahydrocannabinol: NOT DETECTED

## 2022-11-10 LAB — RESP PANEL BY RT-PCR (RSV, FLU A&B, COVID)  RVPGX2
Influenza A by PCR: NEGATIVE
Influenza B by PCR: NEGATIVE
Resp Syncytial Virus by PCR: NEGATIVE
SARS Coronavirus 2 by RT PCR: NEGATIVE

## 2022-11-10 LAB — SALICYLATE LEVEL: Salicylate Lvl: <7 mg/dL — ABNORMAL LOW (ref 7.0–30.0)

## 2022-11-10 LAB — HCG, SERUM, QUALITATIVE: Preg, Serum: NEGATIVE

## 2022-11-10 LAB — ACETAMINOPHEN LEVEL: Acetaminophen (Tylenol), Serum: <10 ug/mL — ABNORMAL LOW (ref 10–30)

## 2022-11-10 LAB — ETHANOL: Alcohol, Ethyl (B): <10 mg/dL (ref <10)

## 2022-11-10 NOTE — ED Notes (Signed)
Physician at bedside with pt mother.
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
## 2022-11-10 NOTE — ED Notes (Signed)
Patient refusing to open eyes or answer questions.  Unable to complete full triage

## 2022-11-11 DIAGNOSIS — F329 Major depressive disorder, single episode, unspecified: Secondary | ICD-10-CM | POA: Diagnosis not present

## 2022-11-11 DIAGNOSIS — F331 Major depressive disorder, recurrent, moderate: Secondary | ICD-10-CM | POA: Diagnosis not present

## 2022-11-11 MED ORDER — HYDROXYZINE HCL 25 MG PO TABS: 25.0000 mg | ORAL_TABLET | Freq: Three times a day (TID) | ORAL | Status: DC | PRN

## 2022-11-11 MED ORDER — ESCITALOPRAM OXALATE 10 MG PO TABS
5.0000 mg | ORAL_TABLET | Freq: Every day | ORAL | Status: DC
  Administered 2022-11-11: 5 mg via ORAL
  Filled 2022-11-11: qty 1

## 2022-11-11 NOTE — ED Notes (Signed)
Pt. Requested IV be removed. I was able to adjust IV so it was not digging into patient skin. Pt denied anything to drink or eat. Place cup on her bedside table with juice.

## 2022-11-11 NOTE — ED Notes (Signed)
Patient Mother brought some undergarments for patient.  This items will be put in the locker 36 its a blue, black and white duffle bag.

## 2022-11-11 NOTE — ED Notes (Signed)
Call received from pt mother Alna Eggert [phone] requesting rtn call for pt status/updates. [employer]

## 2022-11-11 NOTE — ED Notes (Signed)
Patient has been in room.  Patient depressed. Patient continues to endorse suicidal ideation with a plan.

## 2022-11-11 NOTE — ED Provider Notes (Signed)
Emergency Medicine Observation Re-evaluation Note  Erin Jacobson is a 32 y.o. female, seen on rounds today.  Pt initially presented to the ED for complaints of Psychiatric Evaluation Currently, the patient is resting, comfortable.  Physical Exam  BP (!) 119/59 (BP Location: Right Arm)   Pulse 75   Temp 99.1 F (37.3 C) (Oral)   Resp 16   SpO2 100%  Physical Exam General: No distress Cardiac: Regular rate and rhythm Lungs: No increased work of breathing Psych: Calm  ED Course / MDM  EKG:   I have reviewed the labs performed to date as well as medications administered while in observation.  Recent changes in the last 24 hours include none.  Plan  Current plan is for behavioral health evaluation.    Gerhard Munch, MD 11/11/22 [phone]

## 2022-11-11 NOTE — Progress Notes (Signed)
[facility] Psych ED Progress Note  11/11/2022 7:28 PM Erin Jacobson  MRN:  [id number]   Principal Problem: MDD (major depressive disorder), recurrent episode, moderate (HCC) Diagnosis:  Principal Problem:   MDD (major depressive disorder), recurrent episode, moderate (HCC)   ED Assessment Time Calculation: Start Time: 1200 Stop Time: 1240 Total Time in Minutes (Assessment Completion): 40   Subjective: Erin Jacobson, 32 y.o., female patient seen face to face by this provider, consulted with Dr. Lucianne Muss; and chart reviewed on 11/11/22.  On evaluation Erin Jacobson reports that she lives at home with mom and her 12 year old son.  States that she works for [employer] and was recently fired about a week ago but would not elaborate on why she was fired.  Patient states that she has a history of depression and she has a history of suicidal ideations in 2017 she attempted to drown herself, and in 2019 she attempted to overdose on pills, states that each time she was admitted to a psychiatric inpatient facility.  Patient states she is not currently on any psychiatric medications and does not currently see a therapist.  She states that her appetite and sleep are poor due to her depression, denies HI/AVH, continues to endorse suicidal ideations with a plan to overdose on "pills."  Patient denies using any a illicit substances, denies using any alcohol UDS is negative and BAL less than 10.  During evaluation Erin Jacobson is sitting on her bed in no acute distress. She is alert, oriented x 4, calm and attentive. Her mood is sad/depressed with congruent affect.  She has normal speech, with decreased volume, and behavior.  Objectively there is no evidence of psychosis/mania or delusional thinking.  Patient is able to converse coherently, goal directed thoughts, no distractibility, or pre-occupation. She denies self-harm/homicidal ideation, psychosis, and paranoia. Patient continues to endorse suicidal ideations with a plan to  overdose on "pills."  Patient is very withdrawn, does not speak unless spoken to, states she really does not want to talk to anyone but will because she knows she wants to get help.  Spoke with patient's mother Sherene [place], she states that patient went to work on August 11 and came home, states she says the next few days she did not come out of her room or go to work, states that Wednesday supposedly going to a class but did not go for her job, mom states that she spoke to her daughter through the bathroom door.  She states that she is concerned about patient not eating and drinking, but realized that when she (mom) and patient's son went to bed patient will come out of her room and get something to eat and drink.  Ms. Schmelter states that she got tired of it and called the police to come over and talk with patient, but stated they said there was nothing they could do since patient was not IVC, she said yesterday she called a locksmith to, out in open patient's room door, said the patient was just sitting on her bed had food on her bed, and would not talk to her mother.  Ms. Owens states that patient does have issues coping with difficult situations, the patient feels that she tries to get ahead in life and fall short, also states that she thinks patient and boyfriend got into an argument and broke up and patient just started to shut down mentally and emotionally.  She states patient does have a history of depression and has seen  a therapist off and on and has been admitted to inpatient psychiatric facilities twice due to suicidal behavior.  Ms. Lubarsky states that she takes care of patient's son when patient shuts down.  Informed the family that patient will be admitted to an inpatient psychiatric facility, will notify her when patient was transported.  Past Psychiatric History: Depression and anxiety   [place] Scale:  Flowsheet Row ED from 11/10/2022 in [facility] Emergency Department at [facility]  ED from 10/02/2022 in [facility] ED from 08/16/2022 in [facility] at [facility] RISK CATEGORY Low Risk No Risk No Risk       Past Medical History:  Past Medical History:  Diagnosis Date   Complication of anesthesia    Depression    on meds, doing ok   DVT of upper extremity (deep vein thrombosis) (HCC)    GERD (gastroesophageal reflux disease)    no current meds for reflux   Infection    UTI   Kidney stone    stent and foley placed to pass kidney stones 05/05/2014   Missed period 08/04/2022   Needle phobia 08/04/2022   PONV (postoperative nausea and vomiting)    Trichomoniasis     Past Surgical History:  Procedure Laterality Date   APPENDECTOMY  04/01/2004   ex. lap.   CESAREAN SECTION N/A 06/11/2015   Procedure: CESAREAN SECTION;  Surgeon: Marlow Baars, MD;  Location: [facility];  Service: Obstetrics;  Laterality: N/A;   CYSTOSCOPY WITH RETROGRADE PYELOGRAM, URETEROSCOPY AND STENT PLACEMENT Left 05/05/2014   Procedure: LEFT  URETEROSCOPY,  RETROGRADE PYELOGRAM,  AND STENT PLACEMENT, STONE REMOVAL;  Surgeon: Crist Fat, MD;  Location: [facility];  Service: Urology;  Laterality: Left;   HOLMIUM LASER APPLICATION Left 05/05/2014   Procedure: HOLMIUM LASER APPLICATION;  Surgeon: Crist Fat, MD;  Location: [facility];  Service: Urology;  Laterality: Left;   KNEE SURGERY     LAPAROSCOPIC TUBAL LIGATION Bilateral 09/29/2018   Procedure: LAPAROSCOPIC TUBAL LIGATION WITH FILSHIE CLIPS;  Surgeon: Allie Bossier, MD;  Location: [facility];  Service: Gynecology;  Laterality: Bilateral;   Family History:  Family History  Problem Relation Age of Onset   Anesthesia problems Mother        post-op N/V, hard to wake up   Arthritis Mother    Cancer Maternal Grandfather     Social History:  Social History   Substance and Sexual Activity  Alcohol Use Yes     Social History   Substance and Sexual Activity  Drug Use No     Social History   Socioeconomic History   Marital status: Single    Spouse name: Not on file   Number of children: Not on file   Years of education: Not on file   Highest education level: 12th grade  Occupational History   Not on file  Tobacco Use   Smoking status: Some Days    Types: Cigars    Last attempt to quit: 09/24/2014    Years since quitting: 8.1   Smokeless tobacco: Never  Vaping Use   Vaping status: Never Used  Substance and Sexual Activity   Alcohol use: Yes   Drug use: No   Sexual activity: Yes    Birth control/protection: None  Other Topics Concern   Not on file  Social History Narrative   Not on file   Social Determinants of Health   Financial Resource Strain: Patient Declined (  04/03/2022)   Overall Financial Resource Strain (CARDIA)    Difficulty of Paying Living Expenses: Patient declined  Food Insecurity: Food Insecurity Present (04/03/2022)   Hunger Vital Sign    Worried About Running Out of Food in the Last Year: Never true    Ran Out of Food in the Last Year: Sometimes true  Transportation Needs: No Transportation Needs (04/03/2022)   PRAPARE - Administrator, Civil Service (Medical): No    Lack of Transportation (Non-Medical): No  Physical Activity: Sufficiently Active (04/03/2022)   Exercise Vital Sign    Days of Exercise per Week: 3 days    Minutes of Exercise per Session: 60 min  Stress: Stress Concern Present (04/03/2022)   [employer] of Occupational Health - Occupational Stress Questionnaire    Feeling of Stress : To some extent  Social Connections: Socially Isolated (04/03/2022)   Social Connection and Isolation Panel [NHANES]    Frequency of Communication with Friends and Family: Once a week    Frequency of Social Gatherings with Friends and Family: Never    Attends Religious Services: Never    Database administrator or Organizations: No    Attends Engineer, structural: Not on file    Marital Status: Never married     Sleep: Poor  Appetite:  Poor  Current Medications: No current facility-administered medications for this encounter.   Current Outpatient Medications  Medication Sig Dispense Refill   fluconazole (DIFLUCAN) 150 MG tablet Take 1 tablet (150 mg total) by mouth daily. Repeat in 1 week if needed 2 tablet 1   metroNIDAZOLE (METROGEL) 0.75 % vaginal gel Place 1 Applicatorful vaginally 2 (two) times daily. 70 g 0   valACYclovir (VALTREX) 1000 MG tablet Take 1,000 mg by mouth 2 (two) times daily.      Lab Results:  Results for orders placed or performed during the hospital encounter of 11/10/22 (from the past 48 hour(s))  Comprehensive metabolic panel     Status: None   Collection Time: 11/10/22  9:22 PM  Result Value Ref Range   Sodium 137 135 - 145 mmol/L   Potassium 3.8 3.5 - 5.1 mmol/L   Chloride 106 98 - 111 mmol/L   CO2 22 22 - 32 mmol/L   Glucose, Bld 90 70 - 99 mg/dL    Comment: Glucose reference range applies only to samples taken after fasting for at least 8 hours.   BUN 13 6 - 20 mg/dL   Creatinine, Ser 2.95 0.44 - 1.00 mg/dL   Calcium 8.9 8.9 - 28.4 mg/dL   Total Protein 8.0 6.5 - 8.1 g/dL   Albumin 4.0 3.5 - 5.0 g/dL   AST 17 15 - 41 U/L   ALT <5 0 - 44 U/L   Alkaline Phosphatase 59 38 - 126 U/L   Total Bilirubin 0.4 0.3 - 1.2 mg/dL   GFR, Estimated >13 >24 mL/min    Comment: (NOTE) Calculated using the CKD-EPI Creatinine Equation (2021)    Anion gap 9 5 - 15    Comment: Performed at [facility], [address] [place], [place] [address]  Ethanol     Status: None   Collection Time: 11/10/22  9:22 PM  Result Value Ref Range   Alcohol, Ethyl (B) <10 <10 mg/dL    Comment: (NOTE) Lowest detectable limit for serum alcohol is 10 mg/dL.  For medical purposes only. Performed at [facility], [address] [place], [place] [address]  CBC with Diff     Status: Abnormal   Collection Time: 11/10/22  9:22 PM  Result Value  Ref Range   WBC 7.4 4.0 - 10.5 K/uL   RBC 4.36 3.87 - 5.11 MIL/uL   Hemoglobin 11.3 (L) 12.0 - 15.0 g/dL   HCT 16.1 (L) 09.6 - 04.5 %   MCV 82.3 80.0 - 100.0 fL   MCH 25.9 (L) 26.0 - 34.0 pg   MCHC 31.5 30.0 - 36.0 g/dL   RDW 40.9 81.1 - 91.4 %   Platelets 348 150 - 400 K/uL   nRBC 0.0 0.0 - 0.2 %   Neutrophils Relative % 56 %   Neutro Abs 4.1 1.7 - 7.7 K/uL   Lymphocytes Relative 33 %   Lymphs Abs 2.5 0.7 - 4.0 K/uL   Monocytes Relative 6 %   Monocytes Absolute 0.5 0.1 - 1.0 K/uL   Eosinophils Relative 4 %   Eosinophils Absolute 0.3 0.0 - 0.5 K/uL   Basophils Relative 1 %   Basophils Absolute 0.1 0.0 - 0.1 K/uL   Immature Granulocytes 0 %   Abs Immature Granulocytes 0.02 0.00 - 0.07 K/uL    Comment: Performed at [facility], [address] [place], [place] [address]  hCG, serum, qualitative     Status: None   Collection Time: 11/10/22  9:22 PM  Result Value Ref Range   Preg, Serum NEGATIVE NEGATIVE    Comment:        THE SENSITIVITY OF THIS METHODOLOGY IS >10 mIU/mL. Performed at [facility], [address] [place], [place] [address]   Salicylate level     Status: Abnormal   Collection Time: 11/10/22  9:22 PM  Result Value Ref Range   Salicylate Lvl <7.0 (L) 7.0 - 30.0 mg/dL    Comment: Performed at [facility], [address], [place], [place] [address]  Acetaminophen level     Status: Abnormal   Collection Time: 11/10/22  9:22 PM  Result Value Ref Range   Acetaminophen (Tylenol), Serum <10 (L) 10 - 30 ug/mL    Comment: (NOTE) Therapeutic concentrations vary significantly. A range of 10-30 ug/mL  may be an effective concentration for many patients. However, some  are best treated at concentrations outside of this range. Acetaminophen concentrations >150 ug/mL at 4 hours after ingestion  and >50 ug/mL at 12 hours after ingestion are often associated with  toxic reactions.  Performed at [facility], [address] [place], [place] [address]   Resp panel by RT-PCR (RSV, Flu A&B, Covid) Anterior Nasal Swab     Status: None   Collection Time: 11/10/22  9:40 PM   Specimen: Anterior Nasal Swab  Result Value Ref Range   SARS Coronavirus 2 by RT PCR NEGATIVE NEGATIVE    Comment: (NOTE) SARS-CoV-2 target nucleic acids are NOT DETECTED.  The SARS-CoV-2 RNA is generally detectable in upper respiratory specimens during the acute phase of infection. The lowest concentration of SARS-CoV-2 viral copies this assay can detect is 138 copies/mL. A negative result does not preclude SARS-Cov-2 infection and should not be used as the sole basis for treatment or other patient management decisions. A negative result may occur with  improper specimen collection/handling, submission of specimen other than nasopharyngeal swab, presence of viral mutation(s) within the areas targeted by this assay, and inadequate number of viral copies(<138 copies/mL). A negative result must be combined with clinical observations, patient history, and epidemiological information. The expected  result is Negative.  Fact Sheet for Patients:  BloggerCourse.com  Fact Sheet for Healthcare Providers:  SeriousBroker.it  This test is no t yet approved or cleared by the [place] FDA and  has been authorized for detection and/or diagnosis of SARS-CoV-2 by FDA under an Emergency Use Authorization (EUA). This EUA will remain  in effect (meaning this test can be used) for the duration of the COVID-19 declaration under Section 564(b)(1) of the Act, 21 U.S.C.section 360bbb-3(b)(1), unless the authorization is terminated  or revoked sooner.       Influenza A by PCR NEGATIVE NEGATIVE   Influenza B by PCR NEGATIVE NEGATIVE    Comment: (NOTE) The Xpert Xpress SARS-CoV-2/FLU/RSV plus assay is intended as an aid in the diagnosis of influenza from  Nasopharyngeal swab specimens and should not be used as a sole basis for treatment. Nasal washings and aspirates are unacceptable for Xpert Xpress SARS-CoV-2/FLU/RSV testing.  Fact Sheet for Patients: BloggerCourse.com  Fact Sheet for Healthcare Providers: SeriousBroker.it  This test is not yet approved or cleared by the [place] FDA and has been authorized for detection and/or diagnosis of SARS-CoV-2 by FDA under an Emergency Use Authorization (EUA). This EUA will remain in effect (meaning this test can be used) for the duration of the COVID-19 declaration under Section 564(b)(1) of the Act, 21 U.S.C. section 360bbb-3(b)(1), unless the authorization is terminated or revoked.     Resp Syncytial Virus by PCR NEGATIVE NEGATIVE    Comment: (NOTE) Fact Sheet for Patients: BloggerCourse.com  Fact Sheet for Healthcare Providers: SeriousBroker.it  This test is not yet approved or cleared by the [place] FDA and has been authorized for detection and/or diagnosis of SARS-CoV-2 by FDA under an Emergency Use Authorization (EUA). This EUA will remain in effect (meaning this test can be used) for the duration of the COVID-19 declaration under Section 564(b)(1) of the Act, 21 U.S.C. section 360bbb-3(b)(1), unless the authorization is terminated or revoked.  Performed at [facility], [address], [place], [place] [address]   Urine rapid drug screen (hosp performed)     Status: None   Collection Time: 11/10/22  9:59 PM  Result Value Ref Range   Opiates NONE DETECTED NONE DETECTED   Cocaine NONE DETECTED NONE DETECTED   Benzodiazepines NONE DETECTED NONE DETECTED   Amphetamines NONE DETECTED NONE DETECTED   Tetrahydrocannabinol NONE DETECTED NONE DETECTED   Barbiturates NONE DETECTED NONE DETECTED    Comment: (NOTE) DRUG SCREEN FOR MEDICAL PURPOSES ONLY.   IF CONFIRMATION IS NEEDED FOR ANY PURPOSE, NOTIFY LAB WITHIN 5 DAYS.  LOWEST DETECTABLE LIMITS FOR URINE DRUG SCREEN Drug Class                     Cutoff (ng/mL) Amphetamine and metabolites    1000 Barbiturate and metabolites    200 Benzodiazepine                 200 Opiates and metabolites        300 Cocaine and metabolites        300 THC                            50 Performed at [facility], [address], [place], [place] [address]   Urinalysis, Routine w reflex microscopic -Urine, Clean Catch     Status: Abnormal   Collection Time: 11/10/22  9:59 PM  Result Value Ref Range  Color, Urine YELLOW YELLOW   APPearance CLEAR CLEAR   Specific Gravity, Urine 1.010 1.005 - 1.030   pH 5.0 5.0 - 8.0   Glucose, UA NEGATIVE NEGATIVE mg/dL   Hgb urine dipstick MODERATE (A) NEGATIVE   Bilirubin Urine NEGATIVE NEGATIVE   Ketones, ur NEGATIVE NEGATIVE mg/dL   Protein, ur NEGATIVE NEGATIVE mg/dL   Nitrite NEGATIVE NEGATIVE   Leukocytes,Ua NEGATIVE NEGATIVE   RBC / HPF 6-10 0 - 5 RBC/hpf   WBC, UA 0-5 0 - 5 WBC/hpf   Bacteria, UA RARE (A) NONE SEEN   Squamous Epithelial / HPF 0-5 0 - 5 /HPF   Mucus PRESENT     Comment: Performed at [facility] [address], [place], [place] [address]    Blood Alcohol level:  Lab Results  Component Value Date   [facility] <10 11/10/2022   ETH <10 01/10/2018    Physical Findings:  CIWA:    COWS:     Musculoskeletal: Strength & Muscle Tone: within normal limits Gait & Station: normal Patient leans: N/A  Psychiatric Specialty Exam:  Presentation  General Appearance:  Appropriate for Environment  Eye Contact: Minimal  Speech: Clear and Coherent  Speech Volume: Decreased  Handedness: Right   Mood and Affect  Mood: Depressed  Affect: Constricted; Depressed; Flat   Thought Process  Thought Processes: Coherent  Descriptions of Associations:Intact  Orientation:Full (Time, Place and  Person)  Thought Content:WDL  History of Schizophrenia/Schizoaffective disorder:No  Duration of Psychotic Symptoms:No data recorded Hallucinations:Hallucinations: None  Ideas of Reference:None  Suicidal Thoughts:Suicidal Thoughts: Yes, Active SI Active Intent and/or Plan: With Plan  Homicidal Thoughts:Homicidal Thoughts: No   Sensorium  Memory: Immediate Fair; Recent Fair  Judgment: Poor  Insight: Fair   Chartered certified accountant: Fair  Attention Span: Fair  Recall: [employer] of Knowledge: Fair  Language: Fair   Psychomotor Activity  Psychomotor Activity: Psychomotor Activity: Normal   Assets  Assets: Manufacturing systems engineer; Desire for Improvement; Social Support; Housing   Sleep  Sleep: Sleep: Fair    Physical Exam: Physical Exam Vitals and nursing note reviewed. Exam conducted with a chaperone present.  Neurological:     Mental Status: She is alert.  Psychiatric:        Attention and Perception: Attention normal.        Mood and Affect: Mood is anxious and depressed. Affect is flat.        Speech: Speech normal.        Behavior: Behavior is withdrawn. Behavior is cooperative.        Thought Content: Thought content includes suicidal ideation. Thought content includes suicidal plan.        Cognition and Memory: Memory normal.        Judgment: Judgment is inappropriate.    Review of Systems  Constitutional: Negative.   Psychiatric/Behavioral:  Positive for depression and suicidal ideas.    Blood pressure 118/75, pulse 80, temperature 97.9 F (36.6 C), temperature source Oral, resp. rate 18, SpO2 100%. There is no height or weight on file to calculate BMI.    Medical Decision Making: Continued to recommend inpatient psychiatric admission. Will start patient on lexapro 5 mg PO daily for depression and Atarax 25 mg PO TID PRN for anxiety. Pt has been accepted to [facility] TOMORROW 11/12/2022. Bed assignment: Ignacia Palma, PMHNP 11/11/2022, 7:28 PM

## 2022-11-11 NOTE — ED Notes (Signed)
Pt. drank some juice but refused to do any self care.

## 2022-11-11 NOTE — Progress Notes (Signed)
Pt has been accepted to [facility] TOMORROW 11/12/2022. Bed assignment: Erlene Quan  Pt meets inpatient criteria per Alona Bene, PMHNP  Attending Physician will be Theophilus Bones, MD  Report can be called to: [phone]  Pt can arrive after 8 AM  Care Team Notified: Alona Bene, PMHNP, Lum Babe, RN, and Jacquelynn Cree, NT  [place], [place]  11/11/2022 3:10 PM

## 2022-11-11 NOTE — Progress Notes (Signed)
LCSW Progress Note  [id number]   Erin Jacobson  11/11/2022  2:12 PM  Description:   Inpatient Psychiatric Referral  Patient was recommended inpatient per [facility], PMHNP. There are no available beds at [facility], per [facility] Malva Limes, RN. Patient was referred to the following out of network facilities:   Destination  Service Provider Address Phone Fax  [facility]  [address], [place] [address] [phone]  [facility] Patient Placement  [facility], [place] [phone]  [facility] [address]  [place] [address] [phone]  [facility]  [address] [place] [address] [phone]  [facility] [address]  [address] [place] [address] [phone]  [facility]  [address] [place], [place] [address] [phone]  [facility]  [address] [place], [place] [address] [phone]  [facility]  [address] [place], [place] [address] [phone]  [facility]  [address] [place] [address] [phone]  [facility]  [address], [place] [address] [phone]  [facility]  [address] [place] [address] [phone]  [facility]  [address] [place] [address] [phone]  [facility]  [place] [phone]  [facility]  [address] [place], [place] [phone]  [facility]  [address], [place] [address] [phone]  [facility]  [address] [place] [address] [phone]  [facility]  [address] [place], [place] [address] [phone]  [facility]  [address] [place] [address] [phone]  [facility]  [place] [phone]    Situation ongoing, CSW to continue following and update chart as more information becomes available.      Cathie Beams, LCSW  11/11/2022 2:12 PM

## 2022-11-12 NOTE — ED Provider Notes (Signed)
Patient will transfer to [facility] [place] today.  No distress, patient aware of transfer need.   Gerhard Munch, MD 11/12/22 [phone]

## 2022-11-12 NOTE — ED Notes (Signed)
Received call from Jolene Schimke, RN at [facility] to check ETA.  Writer provided report.

## 2022-11-12 NOTE — ED Notes (Signed)
Patient depressed and guarded this shift.

## 2022-11-12 NOTE — ED Notes (Signed)
Called safe transport to transfer patient to [facility] [place]

## 2022-11-13 DIAGNOSIS — F209 Schizophrenia, unspecified: Secondary | ICD-10-CM | POA: Diagnosis not present

## 2022-11-14 DIAGNOSIS — F209 Schizophrenia, unspecified: Secondary | ICD-10-CM | POA: Diagnosis not present

## 2022-11-15 DIAGNOSIS — F209 Schizophrenia, unspecified: Secondary | ICD-10-CM | POA: Diagnosis not present

## 2022-11-16 DIAGNOSIS — F209 Schizophrenia, unspecified: Secondary | ICD-10-CM | POA: Diagnosis not present

## 2022-11-17 DIAGNOSIS — F209 Schizophrenia, unspecified: Secondary | ICD-10-CM | POA: Diagnosis not present

## 2022-11-18 DIAGNOSIS — F209 Schizophrenia, unspecified: Secondary | ICD-10-CM | POA: Diagnosis not present

## 2022-11-26 ENCOUNTER — Telehealth: Payer: Medicaid Other | Admitting: Physician Assistant

## 2022-11-26 DIAGNOSIS — R3989 Other symptoms and signs involving the genitourinary system: Secondary | ICD-10-CM

## 2022-11-26 DIAGNOSIS — N760 Acute vaginitis: Secondary | ICD-10-CM | POA: Diagnosis not present

## 2022-11-26 DIAGNOSIS — B9689 Other specified bacterial agents as the cause of diseases classified elsewhere: Secondary | ICD-10-CM

## 2022-11-27 MED ORDER — NITROFURANTOIN MONOHYD MACRO 100 MG PO CAPS
100.0000 mg | ORAL_CAPSULE | Freq: Two times a day (BID) | ORAL | 0 refills | Status: DC
Start: 2022-11-27 — End: 2023-04-16

## 2022-11-27 MED ORDER — METRONIDAZOLE 500 MG PO TABS: 500.0000 mg | ORAL_TABLET | Freq: Two times a day (BID) | ORAL | 0 refills | Status: DC

## 2022-11-27 NOTE — Progress Notes (Signed)
Duplicate encounter. Treated via e-visit for UTI.

## 2022-11-27 NOTE — Progress Notes (Signed)
E-Visit for Urinary Problems  We are sorry that you are not feeling well.  Here is how we plan to help!  Based on what you shared with me it looks like you most likely have a simple urinary tract infection.  A UTI (Urinary Tract Infection) is a bacterial infection of the bladder.  Most cases of urinary tract infections are simple to treat but a key part of your care is to encourage you to drink plenty of fluids and watch your symptoms carefully.  I have prescribed MacroBid 100 mg twice a day for 5 days.  Your symptoms should gradually improve. Call us if the burning in your urine worsens, you develop worsening fever, back pain or pelvic pain or if your symptoms do not resolve after completing the antibiotic. Giving symptoms of BV and history of the same, I have also prescribed metronidazole to take as directed.  Urinary tract infections can be prevented by drinking plenty of water to keep your body hydrated.  Also be sure when you wipe, wipe from front to back and don't hold it in!  If possible, empty your bladder every 4 hours.  HOME CARE Drink plenty of fluids Compete the full course of the antibiotics even if the symptoms resolve Remember, when you need to go.go. Holding in your urine can increase the likelihood of getting a UTI! GET HELP RIGHT AWAY IF: You cannot urinate You get a high fever Worsening back pain occurs You see blood in your urine You feel sick to your stomach or throw up You feel like you are going to pass out  MAKE SURE YOU  Understand these instructions. Will watch your condition. Will get help right away if you are not doing well or get worse.   Thank you for choosing an e-visit.  Your e-visit answers were reviewed by a board certified advanced clinical practitioner to complete your personal care plan. Depending upon the condition, your plan could have included both over the counter or prescription medications.  Please review your pharmacy choice. Make sure  the pharmacy is open so you can pick up prescription now. If there is a problem, you may contact your provider through [employer] and have the prescription routed to another pharmacy.  Your safety is important to [place]. If you have drug allergies check your prescription carefully.   For the next 24 hours you can use MyChart to ask questions about today's visit, request a non-urgent call back, or ask for a work or school excuse. You will get an email in the next two days asking about your experience. I hope that your e-visit has been valuable and will speed your recovery.

## 2022-11-27 NOTE — Progress Notes (Signed)
I have spent 5 minutes in review of e-visit questionnaire, review and updating patient chart, medical decision making and response to patient.   William Cody Martin, PA-C    

## 2022-12-18 ENCOUNTER — Ambulatory Visit
Admission: RE | Admit: 2022-12-18 | Discharge: 2022-12-18 | Disposition: A | Discharge status: Home / Self Care | Payer: Medicaid Other | Source: Ambulatory Visit | Attending: Family Medicine | Admitting: Family Medicine

## 2022-12-18 VITALS — BP 108/74 | HR 98 | Temp 99.0°F | Ht 61.0 in | Wt 142.0 lb

## 2022-12-18 DIAGNOSIS — J029 Acute pharyngitis, unspecified: Secondary | ICD-10-CM | POA: Insufficient documentation

## 2022-12-18 DIAGNOSIS — N760 Acute vaginitis: Secondary | ICD-10-CM | POA: Diagnosis present

## 2022-12-18 LAB — POCT RAPID STREP A (OFFICE): Rapid Strep A Screen: NEGATIVE

## 2022-12-18 MED ORDER — ACETAMINOPHEN 325 MG PO TABS
650.0000 mg | ORAL_TABLET | Freq: Once | ORAL | Status: AC
  Administered 2022-12-18: 650 mg via ORAL

## 2022-12-18 MED ORDER — IBUPROFEN 600 MG PO TABS: 600.0000 mg | ORAL_TABLET | Freq: Three times a day (TID) | ORAL | 0 refills | Status: DC | PRN

## 2022-12-18 MED ORDER — ONDANSETRON 4 MG PO TBDP: 4.0000 mg | ORAL_TABLET | Freq: Three times a day (TID) | ORAL | 0 refills | Status: DC | PRN

## 2022-12-18 NOTE — ED Provider Notes (Signed)
[name]    CSN: [id number] Arrival date & time: 12/18/22  1251      History   Chief Complaint Chief Complaint  Patient presents with   Sore Throat    HPI Erin Jacobson is a 32 y.o. female.    Sore Throat  Here for sore throat, chills and nausea, and feeling dizzy.  She has also had some myalgia.  Symptoms began 1-1/2 days ago.  She is also had some vaginal discharge with some smell for about 2 to 3 days.  No abdominal pain and no itching.  She is allergic to amoxicillin, which causes itching.  Last menstrual cycle was September 12  Past Medical History:  Diagnosis Date   Complication of anesthesia    Depression    on meds, doing ok   DVT of upper extremity (deep vein thrombosis) (HCC)    GERD (gastroesophageal reflux disease)    no current meds for reflux   Infection    UTI   Kidney stone    stent and foley placed to pass kidney stones 05/05/2014   Missed period 08/04/2022   Needle phobia 08/04/2022   PONV (postoperative nausea and vomiting)    Trichomoniasis     Patient Active Problem List   Diagnosis Date Noted   MDD (major depressive disorder), recurrent episode, moderate (HCC) 07/16/2022   Pap smear of cervix satisfactory but lacking transformation zone 02/01/2021   History of sexually transmitted disease 04/09/2018   Anxiety and depression 04/09/2018   History of cesarean delivery 03/12/2018   History of deep vein thrombosis (DVT) during pregnancy 03/12/2018    Past Surgical History:  Procedure Laterality Date   APPENDECTOMY  04/01/2004   ex. lap.   CESAREAN SECTION N/A 06/11/2015   Procedure: CESAREAN SECTION;  Surgeon: Marlow Baars, MD;  Location: [facility];  Service: Obstetrics;  Laterality: N/A;   CYSTOSCOPY WITH RETROGRADE PYELOGRAM, URETEROSCOPY AND STENT PLACEMENT Left 05/05/2014   Procedure: LEFT  URETEROSCOPY,  RETROGRADE PYELOGRAM,  AND STENT PLACEMENT, STONE REMOVAL;  Surgeon: Crist Fat, MD;  Location: [facility];  Service:  Urology;  Laterality: Left;   HOLMIUM LASER APPLICATION Left 05/05/2014   Procedure: HOLMIUM LASER APPLICATION;  Surgeon: Crist Fat, MD;  Location: [facility];  Service: Urology;  Laterality: Left;   KNEE SURGERY     LAPAROSCOPIC TUBAL LIGATION Bilateral 09/29/2018   Procedure: LAPAROSCOPIC TUBAL LIGATION WITH FILSHIE CLIPS;  Surgeon: Allie Bossier, MD;  Location: [facility];  Service: Gynecology;  Laterality: Bilateral;    OB History     Gravida  3   Para  3   Term  2   Preterm  1   AB      Living  3      SAB      IAB      Ectopic      Multiple  0   Live Births  3            Home Medications    Prior to Admission medications   Medication Sig Start Date End Date Taking? Authorizing Provider  ibuprofen (ADVIL) 600 MG tablet Take 1 tablet (600 mg total) by mouth every 8 (eight) hours as needed (pain). 12/18/22  Yes Zenia Resides, MD  ondansetron (ZOFRAN-ODT) 4 MG disintegrating tablet Take 1 tablet (4 mg total) by mouth every 8 (eight) hours as needed for nausea or vomiting. 12/18/22  Yes Marlinda Mike Janace Aris, MD  nitrofurantoin, macrocrystal-monohydrate, (MACROBID) 100  MG capsule Take 1 capsule (100 mg total) by mouth 2 (two) times daily. 11/27/22   Waldon Merl, PA-C    Family History Family History  Problem Relation Age of Onset   Anesthesia problems Mother        post-op N/V, hard to wake up   Arthritis Mother    Cancer Maternal Grandfather     Social History Social History   Tobacco Use   Smoking status: Some Days    Types: Cigars    Last attempt to quit: 09/24/2014    Years since quitting: 8.2   Smokeless tobacco: Never  Vaping Use   Vaping status: Never Used  Substance Use Topics   Alcohol use: Not Currently   Drug use: No     Allergies   Amoxicillin   Review of Systems Review of Systems   Physical Exam Triage Vital Signs ED Triage Vitals  Encounter Vitals Group     BP 12/18/22 1328 108/74     Systolic BP  Percentile --      Diastolic BP Percentile --      Pulse Rate 12/18/22 1328 98     Resp --      Temp 12/18/22 1328 99 F (37.2 C)     Temp Source 12/18/22 1328 Oral     SpO2 12/18/22 1328 98 %     Weight 12/18/22 1326 142 lb (64.4 kg)     Height 12/18/22 1326 5\' 1"  (1.549 m)     Head Circumference --      Peak Flow --      Pain Score 12/18/22 1326 6     Pain Loc --      Pain Education --      Exclude from Growth Chart --    No data found.  Updated Vital Signs BP 108/74 (BP Location: Left Arm)   Pulse 98   Temp 99 F (37.2 C) (Oral)   Ht 5\' 1"  (1.549 m)   Wt 64.4 kg   LMP 12/04/2022   SpO2 98%   BMI 26.83 kg/m   Visual Acuity Right Eye Distance:   Left Eye Distance:   Bilateral Distance:    Right Eye Near:   Left Eye Near:    Bilateral Near:     Physical Exam Vitals reviewed.  Constitutional:      General: She is not in acute distress.    Appearance: She is not toxic-appearing.  HENT:     Right Ear: Tympanic membrane and ear canal normal.     Left Ear: Tympanic membrane and ear canal normal.     Nose: Nose normal.     Mouth/Throat:     Mouth: Mucous membranes are moist.     Comments: There is erythema of the tonsils bilaterally with some yellow exudate adherent to the tonsils.  Tonsils are 1+ in size, and there is no asymmetry and the uvula is in the midline. Eyes:     Extraocular Movements: Extraocular movements intact.     Conjunctiva/sclera: Conjunctivae normal.     Pupils: Pupils are equal, round, and reactive to light.  Cardiovascular:     Rate and Rhythm: Normal rate and regular rhythm.     Heart sounds: No murmur heard. Pulmonary:     Effort: Pulmonary effort is normal. No respiratory distress.     Breath sounds: No stridor. No wheezing, rhonchi or rales.  Musculoskeletal:     Cervical back: Neck supple.  Lymphadenopathy:     Cervical: No  cervical adenopathy.  Skin:    Capillary Refill: Capillary refill takes less than 2 seconds.      Coloration: Skin is not jaundiced or pale.  Neurological:     General: No focal deficit present.     Mental Status: She is alert and oriented to person, place, and time.  Psychiatric:        Behavior: Behavior normal.      UC Treatments / Results  Labs (all labs ordered are listed, but only abnormal results are displayed) Labs Reviewed  CULTURE, GROUP A STREP [facility])  POCT RAPID STREP A (OFFICE)  CERVICOVAGINAL ANCILLARY ONLY    EKG   Radiology No results found.  Procedures Procedures (including critical care time)  Medications Ordered in UC Medications  acetaminophen (TYLENOL) tablet 650 mg (650 mg Oral Given 12/18/22 1405)    Initial Impression / Assessment and Plan / UC Course  I have reviewed the triage vital signs and the nursing notes.  Pertinent labs & imaging results that were available during my care of the patient were reviewed by me and considered in my medical decision making (see chart for details).       Rapid strep is negative.  Throat culture is sent, and we will notify her and treat per protocol if positive.  Zofran is sent in for the nausea and ibuprofen is sent in for the pain.  Vaginal self swab is done, and we will notify of any positives on that and treat per protocol.   Final Clinical Impressions(s) / UC Diagnoses   Final diagnoses:  Acute pharyngitis, unspecified etiology  Acute vaginitis     Discharge Instructions      Your strep test is negative.  Culture of the throat will be sent, and staff will notify you if that is in turn positive.  Ondansetron dissolved in the mouth every 8 hours as needed for nausea or vomiting. Clear liquids(water, gatorade/pedialyte, ginger ale/sprite, chicken broth/soup) and bland things(crackers/toast, rice, potato, bananas) to eat. Avoid acidic foods like lemon/lime/orange/tomato, and avoid greasy/spicy foods.  Take ibuprofen 600 mg--1 tab every 8 hours as needed for pain.  Staff will notify you if  there is anything positive on the swab.        ED Prescriptions     Medication Sig Dispense Auth. Provider   ondansetron (ZOFRAN-ODT) 4 MG disintegrating tablet Take 1 tablet (4 mg total) by mouth every 8 (eight) hours as needed for nausea or vomiting. 10 tablet Zenia Resides, MD   ibuprofen (ADVIL) 600 MG tablet Take 1 tablet (600 mg total) by mouth every 8 (eight) hours as needed (pain). 15 tablet Jayron Maqueda, Janace Aris, MD      PDMP not reviewed this encounter.   Zenia Resides, MD 12/18/22 [phone]

## 2022-12-18 NOTE — Discharge Instructions (Signed)
Your strep test is negative.  Culture of the throat will be sent, and staff will notify you if that is in turn positive.  Ondansetron dissolved in the mouth every 8 hours as needed for nausea or vomiting. Clear liquids(water, gatorade/pedialyte, ginger ale/sprite, chicken broth/soup) and bland things(crackers/toast, rice, potato, bananas) to eat. Avoid acidic foods like lemon/lime/orange/tomato, and avoid greasy/spicy foods.  Take ibuprofen 600 mg--1 tab every 8 hours as needed for pain.  Staff will notify you if there is anything positive on the swab.

## 2022-12-18 NOTE — ED Triage Notes (Signed)
Patient presents with sore throat, chills, nausea, dizziness and body aches x 1.5 days. Pt states she is also having vaginal discharge and smell x 2-3 days. Treated with Tylenol.

## 2022-12-19 ENCOUNTER — Telehealth: Payer: Self-pay

## 2022-12-19 LAB — CERVICOVAGINAL ANCILLARY ONLY
Bacterial Vaginitis (gardnerella): POSITIVE — AB
Candida Glabrata: NEGATIVE
Candida Vaginitis: POSITIVE — AB
Chlamydia: NEGATIVE
Comment: NEGATIVE
Comment: NEGATIVE
Comment: NEGATIVE
Comment: NEGATIVE
Comment: NEGATIVE
Comment: NORMAL
Neisseria Gonorrhea: NEGATIVE
Trichomonas: NEGATIVE

## 2022-12-19 MED ORDER — METRONIDAZOLE 500 MG PO TABS: 500.0000 mg | ORAL_TABLET | Freq: Two times a day (BID) | ORAL | 0 refills | Status: AC

## 2022-12-19 MED ORDER — FLUCONAZOLE 150 MG PO TABS: 150.0000 mg | ORAL_TABLET | Freq: Once | ORAL | 0 refills | Status: AC

## 2022-12-19 NOTE — Telephone Encounter (Signed)
Per protocol, pt requires tx with metronidazole and Diflucan.  Attempted to reach patient x1. Unable to LVM.  Rx sent to pharmacy on file.

## 2022-12-21 LAB — CULTURE, GROUP A STREP (THRC)

## 2022-12-24 ENCOUNTER — Telehealth: Payer: Self-pay

## 2022-12-24 MED ORDER — METRONIDAZOLE 0.75 % VA GEL: 1.0000 | Freq: Every day | VAGINAL | 0 refills | Status: AC

## 2022-12-24 NOTE — Telephone Encounter (Signed)
Pt called requesting metronidazole PO be changed to metrogel.

## 2023-04-16 ENCOUNTER — Other Ambulatory Visit: Payer: Self-pay

## 2023-04-16 ENCOUNTER — Other Ambulatory Visit (HOSPITAL_COMMUNITY)
Admission: RE | Admit: 2023-04-16 | Discharge: 2023-04-16 | Disposition: A | Discharge status: Home / Self Care | Payer: MEDICAID | Source: Ambulatory Visit | Attending: Family Medicine | Admitting: Family Medicine

## 2023-04-16 ENCOUNTER — Ambulatory Visit: Payer: MEDICAID

## 2023-04-16 VITALS — BP 120/77 | HR 85 | Ht 61.0 in | Wt 161.3 lb

## 2023-04-16 DIAGNOSIS — N898 Other specified noninflammatory disorders of vagina: Secondary | ICD-10-CM | POA: Insufficient documentation

## 2023-04-16 NOTE — Progress Notes (Signed)
Pt here today for self swab with c/o itching in the inside of the vagina with no discharge.  Pt explained how to obtain self swab and that we will call with abnormal results.  Pt verbalized understanding with no further questions.  Leonette Nutting  04/16/23

## 2023-04-17 ENCOUNTER — Other Ambulatory Visit: Payer: Self-pay | Admitting: Obstetrics and Gynecology

## 2023-04-17 LAB — CERVICOVAGINAL ANCILLARY ONLY
Bacterial Vaginitis (gardnerella): NEGATIVE
Candida Glabrata: NEGATIVE
Candida Vaginitis: POSITIVE — AB
Chlamydia: NEGATIVE
Comment: NEGATIVE
Comment: NEGATIVE
Comment: NEGATIVE
Comment: NEGATIVE
Comment: NEGATIVE
Comment: NORMAL
Neisseria Gonorrhea: NEGATIVE
Trichomonas: NEGATIVE

## 2023-04-17 MED ORDER — FLUCONAZOLE 150 MG PO TABS: 150.0000 mg | ORAL_TABLET | Freq: Once | ORAL | 0 refills | Status: AC

## 2023-05-07 ENCOUNTER — Ambulatory Visit
Admission: RE | Admit: 2023-05-07 | Discharge: 2023-05-07 | Disposition: A | Discharge status: Home / Self Care | Payer: MEDICAID | Source: Ambulatory Visit | Attending: Family Medicine | Admitting: Family Medicine

## 2023-05-07 VITALS — BP 120/80 | HR 89 | Temp 98.1°F | Resp 18

## 2023-05-07 DIAGNOSIS — N760 Acute vaginitis: Secondary | ICD-10-CM | POA: Diagnosis present

## 2023-05-07 DIAGNOSIS — Z113 Encounter for screening for infections with a predominantly sexual mode of transmission: Secondary | ICD-10-CM | POA: Diagnosis not present

## 2023-05-07 MED ORDER — METRONIDAZOLE 0.75 % VA GEL: 1.0000 | Freq: Two times a day (BID) | VAGINAL | 0 refills | Status: DC

## 2023-05-07 MED ORDER — FLUCONAZOLE 150 MG PO TABS: 150.0000 mg | ORAL_TABLET | ORAL | 0 refills | Status: DC | PRN

## 2023-05-07 NOTE — ED Provider Notes (Signed)
[name]    CSN: [id number] Arrival date & time: 05/07/23  1007      History   Chief Complaint Chief Complaint  Patient presents with   Vaginal Discharge    Entered by patient    HPI Erin Jacobson is a 33 y.o. female.   Patient presents today with a 3-day history of abnormal vaginal discharge.  She endorses that it has been a mouth odorous that is fishlike and thick in consistency [place] is milky.  Patient is sexually active and does not use barrier protection.  She reports inserting 1 dose of boric acid with minimal relief of symptoms overnight.  She endorses that she has a history of yeast however is not having any itchy symptoms although has also previously had BV and current symptoms seem similar.  She denies any urinary symptoms for UTI or any nausea or vomiting.  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 lap.   CESAREAN SECTION N/A 06/11/2015   Procedure: CESAREAN SECTION;  Surgeon: Marlow Baars, MD;  Location: [facility];  Service: Obstetrics;  Laterality: N/A;    CYSTOSCOPY WITH RETROGRADE PYELOGRAM, URETEROSCOPY AND STENT PLACEMENT Left 05/05/2014   Procedure: LEFT  URETEROSCOPY,  RETROGRADE PYELOGRAM,  AND STENT PLACEMENT, STONE REMOVAL;  Surgeon: Crist Fat, MD;  Location[facility];  Service: Urology;  Laterality: Left;   HOLMIUM LASER APPLICATION Left 05/05/2014   Procedure: HOLMIUM LASER APPLICATION;  Surgeon: Crist Fat, MD;  Location[facility];  Service: Urology;  Laterality: Left;   KNEE SURGERY     LAPAROSCOPIC TUBAL LIGATION Bilateral 09/29/2018   Procedure: LAPAROSCOPIC TUBAL LIGATION WITH FILSHIE CLIPS;  Surgeon: Allie Bossier, MD;  Location: [facility];  Service: Gynecology;  Laterality: Bilateral;    OB History     Gravida  3   Para  3   Term  2   Preterm  1   AB      Living  3      SAB      IAB      Ectopic      Multiple  0   Live Births  3            Home Medications    Prior to Admission medications   Not on File    Family History Family History  Problem Relation Age of Onset   Anesthesia problems Mother        post-op N/V, hard to wake up   Arthritis  Mother    Cancer Maternal Grandfather     Social History Social History   Tobacco Use   Smoking status: Some Days    Types: Cigars    Last attempt to quit: 09/24/2014    Years since quitting: 8.6   Smokeless tobacco: Never  Vaping Use   Vaping status: Never Used  Substance Use Topics   Alcohol use: Not Currently   Drug use: No     Allergies   Amoxicillin   Review of Systems Review of Systems  Genitourinary:  Positive for vaginal discharge.     Physical Exam Triage Vital Signs ED Triage Vitals  Encounter Vitals Group     BP 05/07/23 1025 120/80     Systolic BP Percentile --      Diastolic BP Percentile --      Pulse Rate 05/07/23 1025 89     Resp 05/07/23 1025 18     Temp 05/07/23 1025 98.1 F (36.7 C)     Temp Source 05/07/23 1025 Oral     SpO2 05/07/23 1025 98 %     Weight --      Height --       Head Circumference --      Peak Flow --      Pain Score 05/07/23 1024 0     Pain Loc --      Pain Education --      Exclude from Growth Chart --    No data found.  Updated Vital Signs BP 120/80 (BP Location: Right Arm)   Pulse 89   Temp 98.1 F (36.7 C) (Oral)   Resp 18   LMP 04/19/2023 (Exact Date)   SpO2 98%   Visual Acuity Right Eye Distance:   Left Eye Distance:   Bilateral Distance:    Right Eye Near:   Left Eye Near:    Bilateral Near:     Physical Exam General appearance: Alert, well developed, well nourished, cooperative  Head: Normocephalic, without obvious abnormality, atraumatic Heart: Rate and rhythm normal.  Respiratory: Respirations even and unlabored, normal respiratory rate Extremities: No gross deformities Neurologic: No focal neurological deficits obvious during exam UC Treatments / Results  Labs (all labs ordered are listed, but only abnormal results are displayed) Labs Reviewed  HIV ANTIBODY (ROUTINE TESTING W REFLEX)  RPR  CERVICOVAGINAL ANCILLARY ONLY    EKG   Radiology No results found.  Procedures Procedures (including critical care time)  Medications Ordered in UC Medications - No data to display  Initial Impression / Assessment and Plan / UC Course  I have reviewed the triage vital signs and the nursing notes.  Pertinent labs & imaging results that were available during my care of the patient were reviewed by me and considered in my medical decision making (see chart for details).    Going for STDs HIV and RPR pending.  Vaginal cytology pending checking for STDs and vaginitis bacterium's including gonorrhea, chlamydia, BV, and yeast.  Patient opted to be treated presumptively for BV MetroGel and Diflucan prophylaxis against antibiotic induced yeast.  Advised that our office will call with results if they are abnormal however she is also advised to call this office directly if she sees any concerning findings once her results are  available on MyChart.  Patient verbalized understanding and agreement with plan. Final Clinical Impressions(s) / UC Diagnoses   Final diagnoses:  Vaginitis and vulvovaginitis  Screen for STD (sexually transmitted disease)   Discharge Instructions   None  ED Prescriptions   None    PDMP not reviewed this encounter.   Bing Neighbors, NP 05/07/23 1101

## 2023-05-07 NOTE — Discharge Instructions (Addendum)
Results will update to MyChart. Call if any questions regarding results.

## 2023-05-07 NOTE — ED Triage Notes (Signed)
Pt presents with discharge for 3 days. There is a fishy odor and a discharge is a creamy milky consistency. Pt states she is having unprotected sex and she is requesting STD testing. Lastly, she mentions using a boric acid suppository last night to relieve symptoms.

## 2023-05-08 ENCOUNTER — Telehealth: Payer: MEDICAID | Admitting: Physician Assistant

## 2023-05-08 DIAGNOSIS — R3989 Other symptoms and signs involving the genitourinary system: Secondary | ICD-10-CM

## 2023-05-08 LAB — CERVICOVAGINAL ANCILLARY ONLY
Bacterial Vaginitis (gardnerella): POSITIVE — AB
Candida Glabrata: NEGATIVE
Candida Vaginitis: NEGATIVE
Chlamydia: NEGATIVE
Comment: NEGATIVE
Comment: NEGATIVE
Comment: NEGATIVE
Comment: NEGATIVE
Comment: NEGATIVE
Comment: NORMAL
Neisseria Gonorrhea: NEGATIVE
Trichomonas: NEGATIVE

## 2023-05-08 LAB — HIV ANTIBODY (ROUTINE TESTING W REFLEX): HIV Screen 4th Generation wRfx: NONREACTIVE

## 2023-05-08 LAB — RPR: RPR Ser Ql: NONREACTIVE

## 2023-05-08 MED ORDER — NITROFURANTOIN MONOHYD MACRO 100 MG PO CAPS
100.0000 mg | ORAL_CAPSULE | Freq: Two times a day (BID) | ORAL | 0 refills | Status: DC
Start: 2023-05-08 — End: 2024-01-05

## 2023-05-08 NOTE — Progress Notes (Signed)

## 2023-09-01 ENCOUNTER — Ambulatory Visit: Payer: MEDICAID

## 2023-09-14 ENCOUNTER — Ambulatory Visit: Payer: MEDICAID

## 2023-09-14 NOTE — Progress Notes (Deleted)
 Pt here today for self swab nurse visit.  She reports.... Advised pt that staff will contact her for any abnormal results.  Pt verbalized understanding with no further questions.    [place], RN

## 2023-10-07 ENCOUNTER — Ambulatory Visit
Admission: RE | Admit: 2023-10-07 | Discharge: 2023-10-07 | Disposition: A | Discharge status: Home / Self Care | Payer: MEDICAID | Source: Ambulatory Visit | Attending: Family Medicine | Admitting: Family Medicine

## 2023-10-07 VITALS — BP 142/83 | HR 76 | Temp 98.0°F | Resp 18 | Wt 161.4 lb

## 2023-10-07 DIAGNOSIS — N898 Other specified noninflammatory disorders of vagina: Secondary | ICD-10-CM | POA: Insufficient documentation

## 2023-10-07 MED ORDER — METRONIDAZOLE 0.75 % VA GEL: 1.0000 | Freq: Two times a day (BID) | VAGINAL | 0 refills | Status: DC

## 2023-10-07 NOTE — Discharge Instructions (Signed)
We have sent testing for various causes of vaginal infections. We will notify you of any positive results once they are received. If required, we will prescribe any medications you might need.  Please refrain from all sexual activity for at least the next seven days.  

## 2023-10-07 NOTE — ED Triage Notes (Signed)
 I have reoccurring BV and yeast infections. - Entered by patient

## 2023-10-08 ENCOUNTER — Ambulatory Visit (HOSPITAL_COMMUNITY): Payer: Self-pay

## 2023-10-08 LAB — CERVICOVAGINAL ANCILLARY ONLY
Bacterial Vaginitis (gardnerella): POSITIVE — AB
Candida Glabrata: NEGATIVE
Candida Vaginitis: NEGATIVE
Chlamydia: NEGATIVE
Comment: NEGATIVE
Comment: NEGATIVE
Comment: NEGATIVE
Comment: NEGATIVE
Comment: NEGATIVE
Comment: NORMAL
Neisseria Gonorrhea: NEGATIVE
Trichomonas: NEGATIVE

## 2023-10-08 NOTE — ED Provider Notes (Signed)
 [facility]   [id number] 10/07/23 Arrival Time: 1846  ASSESSMENT & PLAN:  1. Vaginal discharge    Meds ordered this encounter  Medications   metroNIDAZOLE  (METROGEL ) 0.75 % vaginal gel    Sig: Place 1 Applicatorful vaginally 2 (two) times daily.    Dispense:  70 g    Refill:  0  Requests gel.    Discharge Instructions      We have sent testing for various causes of vaginal infections. We will notify you of any positive results once they are received. If required, we will prescribe any medications you might need.  Please refrain from all sexual activity for at least the next seven days.     Without s/s of PID.  Labs Reviewed  CERVICOVAGINAL ANCILLARY ONLY     Will notify of any positive results. Instructed to refrain from sexual activity for at least seven days.  Reviewed expectations re: course of current medical issues. Questions answered. Outlined signs and symptoms indicating need for more acute intervention. Patient verbalized understanding. After Visit Summary given.   SUBJECTIVE:  Erin Jacobson is a 33 y.o. female who presents with complaint of vaginal discharge. H/O BV and feels this is BV. Otherwise well. Patient's last menstrual period was 10/06/2023 (exact date).   OBJECTIVE:  Vitals:   10/07/23 1916  BP: (!) 142/83  Pulse: 76  Resp: 18  Temp: 98 F (36.7 C)  TempSrc: Oral  SpO2: 98%  Weight: 73.2 kg    General appearance: alert, cooperative, appears stated age and no distress Lungs: unlabored respirations; speaks full sentences without difficulty Back: no CVA tenderness; FROM at waist Abdomen: soft, non-tender GU: deferred Skin: warm and dry Psychological: alert and cooperative; normal mood and affect.    Labs Reviewed  CERVICOVAGINAL ANCILLARY ONLY    Allergies  Allergen Reactions   Amoxicillin  Itching and Other (See Comments)    Patient did not acknowledge this in 10/2022    Past Medical History:  Diagnosis  Date   Complication of anesthesia    Depression    on meds, doing ok   DVT of upper extremity (deep vein thrombosis) (HCC)    GERD (gastroesophageal reflux disease)    no current meds for reflux   Infection    UTI   Kidney stone    stent and foley placed to pass kidney stones 05/05/2014   Missed period 08/04/2022   Needle phobia 08/04/2022   PONV (postoperative nausea and vomiting)    Trichomoniasis    Family History  Problem Relation Age of Onset   Anesthesia problems Mother        post-op N/V, hard to wake up   Arthritis Mother    Cancer Maternal Grandfather    Social History   Socioeconomic History   Marital status: Single    Spouse name: Not on file   Number of children: Not on file   Years of education: Not on file   Highest education level: 12th grade  Occupational History   Not on file  Tobacco Use   Smoking status: Some Days    Types: Cigars    Last attempt to quit: 09/24/2014    Years since quitting: 9.0   Smokeless tobacco: Never  Vaping Use   Vaping status: Never Used  Substance and Sexual Activity   Alcohol use: Not Currently   Drug use: No   Sexual activity: Yes    Birth control/protection: None  Other Topics Concern   Not on file  Social History Narrative   Not on file   Social Drivers of Health   Financial Resource Strain: Patient Declined (04/03/2022)   Overall Financial Resource Strain (CARDIA)    Difficulty of Paying Living Expenses: Patient declined  Food Insecurity: Food Insecurity Present (04/03/2022)   Hunger Vital Sign    Worried About Running Out of Food in the Last Year: Never true    Ran Out of Food in the Last Year: Sometimes true  Transportation Needs: No Transportation Needs (04/03/2022)   PRAPARE - Administrator, Civil Service (Medical): No    Lack of Transportation (Non-Medical): No  Physical Activity: Sufficiently Active (04/03/2022)   Exercise Vital Sign    Days of Exercise per Week: 3 days    Minutes of Exercise  per Session: 60 min  Stress: Stress Concern Present (04/03/2022)   [employer] of Occupational Health - Occupational Stress Questionnaire    Feeling of Stress : To some extent  Social Connections: Socially Isolated (04/03/2022)   Social Connection and Isolation Panel    Frequency of Communication with Friends and Family: Once a week    Frequency of Social Gatherings with Friends and Family: Never    Attends Religious Services: Never    Database administrator or Organizations: No    Attends Banker Meetings: Not on file    Marital Status: Never married  Intimate Partner Violence: Not on file           [place], MD 10/08/23 [phone]

## 2023-11-16 ENCOUNTER — Ambulatory Visit
Admission: EM | Admit: 2023-11-16 | Discharge: 2023-11-16 | Disposition: A | Discharge status: Home / Self Care | Payer: MEDICAID | Attending: Emergency Medicine | Admitting: Emergency Medicine

## 2023-11-16 DIAGNOSIS — H60393 Other infective otitis externa, bilateral: Secondary | ICD-10-CM | POA: Diagnosis not present

## 2023-11-16 MED ORDER — OFLOXACIN 0.3 % OT SOLN: 5.0000 [drp] | Freq: Two times a day (BID) | OTIC | 0 refills | Status: AC

## 2023-11-16 NOTE — Discharge Instructions (Addendum)
 You are seen today at urgent care for concerns of irritation.  You appear to have some form of either eczema or possible developing ear infection to the outer ears of bilateral ears.  I have started you on a medication called ofloxacin  that you will use twice daily for the next 7 days.  Please use this as prescribed.  Try to keep the ears clean and dry as best as you can.  Follow-up with your primary care provider for further evaluation.  For any concerns new or worsening symptoms, return urgent care or to the emergency department for further evaluation.

## 2023-11-16 NOTE — ED Triage Notes (Signed)
 My right ear has a bump it but both ears are itchy, I did recently use peroxide recently to try and clean them out. No fever.

## 2023-11-16 NOTE — ED Provider Notes (Signed)
 [name]    CSN: [id number] Arrival date & time: 11/16/23  1823      History   Chief Complaint Chief Complaint  Patient presents with   Ear Problem    HPI Erin Jacobson is a 33 y.o. female.  Patient with past history significant for depression presents urgent care with concerns of ear problems.  She reports she has had several days of ongoing feelings of moisture and itching in bilateral ears but the left being more uncomfortable.  Denies any recent fever, chills, body aches.  No reported sick contacts as far she is aware.  States that she has had this occur several times and will resolve on its own.  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 lap.   CESAREAN SECTION N/A 06/11/2015   Procedure: CESAREAN SECTION;  Surgeon: Jolene Gaskins, MD;  Location: [facility];  Service: Obstetrics;  Laterality: N/A;   CYSTOSCOPY WITH RETROGRADE PYELOGRAM, URETEROSCOPY AND STENT PLACEMENT Left 05/05/2014   Procedure: LEFT  URETEROSCOPY,  RETROGRADE PYELOGRAM,  AND STENT PLACEMENT, STONE  REMOVAL;  Surgeon: Morene LELON Salines, MD;  Location: [facility];  Service: Urology;  Laterality: Left;   HOLMIUM LASER APPLICATION Left 05/05/2014   Procedure: HOLMIUM LASER APPLICATION;  Surgeon: Morene LELON Salines, MD;  Location: [facility];  Service: Urology;  Laterality: Left;   KNEE SURGERY     LAPAROSCOPIC TUBAL LIGATION Bilateral 09/29/2018   Procedure: LAPAROSCOPIC TUBAL LIGATION WITH FILSHIE CLIPS;  Surgeon: Starla Harland BROCKS, MD;  Location: [facility];  Service: Gynecology;  Laterality: Bilateral;    OB History     Gravida  3   Para  3   Term  2   Preterm  1   AB      Living  3      SAB      IAB      Ectopic      Multiple  0   Live Births  3            Home Medications    Prior to Admission medications   Medication Sig Start Date End Date Taking? Authorizing Provider  ofloxacin  (FLOXIN ) 0.3 % OTIC solution Place 5 drops into both ears 2 (two) times daily for 7 days. 11/16/23 11/23/23 Yes Lakendra Helling A, PA-C  fluconazole  (DIFLUCAN ) 150 MG tablet Take 1 tablet (150 mg total) by mouth every three (3) days as needed. 05/07/23   Arloa Suzen RAMAN, NP  metroNIDAZOLE  (METROGEL ) 0.75 %  vaginal gel Place 1 Applicatorful vaginally 2 (two) times daily. 10/07/23   Rolinda Rogue, MD  nitrofurantoin , macrocrystal-monohydrate, (MACROBID ) 100 MG capsule Take 1 capsule (100 mg total) by mouth 2 (two) times daily. 05/08/23   Vivienne Delon HERO, PA-C    Family History Family History  Problem Relation Age of Onset   Anesthesia problems Mother        post-op N/V, hard to wake up   Arthritis Mother    Cancer Maternal Grandfather     Social History Social History   Tobacco Use   Smoking status: Some Days    Types: Cigars    Last attempt to quit: 09/24/2014    Years since quitting: 9.1   Smokeless tobacco: Never  Vaping Use   Vaping status: Never Used  Substance Use Topics   Alcohol use: Not Currently   Drug use: No     Allergies   Amoxicillin    Review  of Systems Review of Systems  HENT:  Positive for ear discharge.   All other systems reviewed and are negative.    Physical Exam Triage Vital Signs ED Triage Vitals  Encounter Vitals Group     BP 11/16/23 1841 118/75     Girls Systolic BP Percentile --      Girls Diastolic BP Percentile --      Boys Systolic BP Percentile --      Boys Diastolic BP Percentile --      Pulse Rate 11/16/23 1841 91     Resp 11/16/23 1841 18     Temp 11/16/23 1841 98.1 F (36.7 C)     Temp Source 11/16/23 1841 Oral     SpO2 11/16/23 1841 98 %     Weight 11/16/23 1839 161 lb 6 oz (73.2 kg)     Height 11/16/23 1839 5' (1.524 m)     Head Circumference --      Peak Flow --      Pain Score 11/16/23 1837 0     Pain Loc --      Pain Education --      Exclude from Growth Chart --    No data found.  Updated Vital Signs BP 118/75 (BP Location: Left Arm)   Pulse 91   Temp 98.1 F (36.7 C) (Oral)   Resp 18   Ht 5' (1.524 m)   Wt 161 lb 6 oz (73.2 kg)   LMP 10/27/2023 (Exact Date)   SpO2 98%   BMI 31.52 kg/m   Visual Acuity Right Eye Distance:   Left Eye Distance:   Bilateral Distance:    Right Eye Near:   Left Eye Near:    Bilateral Near:     Physical Exam Vitals and nursing note reviewed.  Constitutional:      General: She is not in acute distress.    Appearance: She is well-developed.  HENT:     Head: Normocephalic and atraumatic.     Comments: Slight redness and moisture present in bilateral ear canals.    Right Ear: Tympanic membrane, ear canal and external ear normal. There is no impacted cerumen.     Left Ear: Tympanic membrane and external ear normal. There is impacted cerumen.  Eyes:     Conjunctiva/sclera: Conjunctivae normal.  Cardiovascular:     Rate and Rhythm: Normal rate and regular rhythm.     Heart sounds: No murmur heard. Pulmonary:     Effort: Pulmonary effort is normal. No respiratory distress.     Breath sounds:  Normal breath sounds.  Abdominal:      Palpations: Abdomen is soft.     Tenderness: There is no abdominal tenderness.  Musculoskeletal:        General: No swelling.     Cervical back: Neck supple.  Skin:    General: Skin is warm and dry.     Capillary Refill: Capillary refill takes less than 2 seconds.  Neurological:     Mental Status: She is alert.  Psychiatric:        Mood and Affect: Mood normal.      UC Treatments / Results  Labs (all labs ordered are listed, but only abnormal results are displayed) Labs Reviewed - No data to display  EKG   Radiology No results found.  Procedures Procedures (including critical care time)  Medications Ordered in UC Medications - No data to display  Initial Impression / Assessment and Plan / UC Course  I have reviewed the triage vital signs and the nursing notes.  Pertinent labs & imaging results that were available during my care of the patient were reviewed by me and considered in my medical decision making (see chart for details).  Patient presents to urgent care today with concerns of an ear problem.  Reports she has had ongoing issues with itching, and pain in bilateral ears for the last several days.  Reports that this occurred multiple times.  Denies any history of significant skin sensitivities but occasionally develop a rash in her right arm.  On exam, patient has impacted cerumen on the left ear canal.  Bilateral ear canals appear to be moist slightly erythematous.  Unclear if she may be developing an external ear infection.  Will start patient on a course of ofloxacin  drops for the next 7 days.  Vies patient to follow-up closely with primary care provider for repeat evaluation.  Okay stable for outpatient follow-up and discharge home. Final Clinical Impressions(s) / UC Diagnoses   Final diagnoses:  Infective otitis externa of both ears     Discharge Instructions      You are seen today at urgent care for concerns of irritation.  You appear to have some form  of either eczema or possible developing ear infection to the outer ears of bilateral ears.  I have started you on a medication called ofloxacin  that you will use twice daily for the next 7 days.  Please use this as prescribed.  Try to keep the ears clean and dry as best as you can.  Follow-up with your primary care provider for further evaluation.  For any concerns new or worsening symptoms, return urgent care or to the emergency department for further evaluation.     ED Prescriptions     Medication Sig Dispense Auth. Provider   ofloxacin  (FLOXIN ) 0.3 % OTIC solution Place 5 drops into both ears 2 (two) times daily for 7 days. 5 mL Tesa Meadors A, PA-C      PDMP not reviewed this encounter.   Ezzard Ditmer A, PA-C 11/16/23 1931

## 2024-01-05 ENCOUNTER — Ambulatory Visit
Admission: RE | Admit: 2024-01-05 | Discharge: 2024-01-05 | Disposition: A | Discharge status: Home / Self Care | Payer: MEDICAID | Source: Ambulatory Visit | Attending: Family Medicine | Admitting: Family Medicine

## 2024-01-05 VITALS — BP 127/81 | HR 89 | Temp 98.3°F | Resp 16 | Ht 61.0 in | Wt 145.0 lb

## 2024-01-05 DIAGNOSIS — N898 Other specified noninflammatory disorders of vagina: Secondary | ICD-10-CM | POA: Diagnosis not present

## 2024-01-05 MED ORDER — METRONIDAZOLE 0.75 % VA GEL: 1.0000 | Freq: Every day | VAGINAL | 0 refills | Status: AC

## 2024-01-05 NOTE — ED Triage Notes (Signed)
 Pt c/o vaginal discharge with foul smelling odor   Onset yesterday st's she want's to be tested for STD's

## 2024-01-05 NOTE — Discharge Instructions (Addendum)
We have sent testing for various causes of vaginal infections. We will notify you of any positive results once they are received. If required, we will prescribe any medications you might need.  Please refrain from all sexual activity for at least the next seven days.  

## 2024-01-06 LAB — CERVICOVAGINAL ANCILLARY ONLY
Bacterial Vaginitis (gardnerella): POSITIVE — AB
Candida Glabrata: NEGATIVE
Candida Vaginitis: POSITIVE — AB
Chlamydia: NEGATIVE
Comment: NEGATIVE
Comment: NEGATIVE
Comment: NEGATIVE
Comment: NEGATIVE
Comment: NEGATIVE
Comment: NORMAL
Neisseria Gonorrhea: NEGATIVE
Trichomonas: NEGATIVE

## 2024-01-06 NOTE — ED Provider Notes (Signed)
 [facility]   [id number] 01/05/24 Arrival Time: 1653  ASSESSMENT & PLAN:  1. Vaginal discharge       Discharge Instructions      We have sent testing for various causes of vaginal infections. We will notify you of any positive results once they are received. If required, we will prescribe any medications you might need.  Please refrain from all sexual activity for at least the next seven days.       Without s/s of PID.  Labs Reviewed  CERVICOVAGINAL ANCILLARY ONLY    Pending: Unresulted Labs (From admission, onward)    None        Will notify of any positive results. Instructed to refrain from sexual activity for at least seven days.  Reviewed expectations re: course of current medical issues. Questions answered. Outlined signs and symptoms indicating need for more acute intervention. Patient verbalized understanding. After Visit Summary given.   SUBJECTIVE:  Erin Jacobson is a 33 y.o. female who presents with complaint of vaginal discharge. Pt c/o vaginal discharge with foul smelling odor   Onset yesterday st's she want's to be tested for STD's   No LMP recorded (exact date). (Menstrual status: Bilateral Tubal Ligation).   OBJECTIVE:  Vitals:   01/05/24 1713 01/05/24 1715  BP: 127/81   Pulse: 89   Resp: 16   Temp: 98.3 F (36.8 C)   TempSrc: Oral   SpO2: 98%   Weight:  65.8 kg  Height:  5' 1 (1.549 m)     General appearance: alert, cooperative, appears stated age and no distress Lungs: unlabored respirations; speaks full sentences without difficulty Back: no CVA tenderness; FROM at waist Abdomen: soft, non-tender GU: deferred Skin: warm and dry Psychological: alert and cooperative; normal mood and affect.    Labs Reviewed  CERVICOVAGINAL ANCILLARY ONLY    Allergies  Allergen Reactions   Amoxicillin  Itching and Other (See Comments)    Patient did not acknowledge this in 10/2022    Past Medical History:  Diagnosis  Date   Complication of anesthesia    Depression    on meds, doing ok   DVT of upper extremity (deep vein thrombosis) (HCC)    GERD (gastroesophageal reflux disease)    no current meds for reflux   Infection    UTI   Kidney stone    stent and foley placed to pass kidney stones 05/05/2014   Missed period 08/04/2022   Needle phobia 08/04/2022   PONV (postoperative nausea and vomiting)    Trichomoniasis    Family History  Problem Relation Age of Onset   Anesthesia problems Mother        post-op N/V, hard to wake up   Arthritis Mother    Cancer Maternal Grandfather    Social History   Socioeconomic History   Marital status: Single    Spouse name: Not on file   Number of children: Not on file   Years of education: Not on file   Highest education level: 12th grade  Occupational History   Not on file  Tobacco Use   Smoking status: Some Days    Types: Cigars    Last attempt to quit: 09/24/2014    Years since quitting: 9.2   Smokeless tobacco: Never  Vaping Use   Vaping status: Never Used  Substance and Sexual Activity   Alcohol use: Not Currently   Drug use: No   Sexual activity: Yes    Birth control/protection: None  Other Topics  Concern   Not on file  Social History Narrative   Not on file   Social Drivers of Health   Financial Resource Strain: Patient Declined (04/03/2022)   Overall Financial Resource Strain (CARDIA)    Difficulty of Paying Living Expenses: Patient declined  Food Insecurity: Food Insecurity Present (04/03/2022)   Hunger Vital Sign    Worried About Running Out of Food in the Last Year: Never true    Ran Out of Food in the Last Year: Sometimes true  Transportation Needs: No Transportation Needs (04/03/2022)   PRAPARE - Administrator, Civil Service (Medical): No    Lack of Transportation (Non-Medical): No  Physical Activity: Sufficiently Active (04/03/2022)   Exercise Vital Sign    Days of Exercise per Week: 3 days    Minutes of Exercise  per Session: 60 min  Stress: Stress Concern Present (04/03/2022)   [employer] of Occupational Health - Occupational Stress Questionnaire    Feeling of Stress : To some extent  Social Connections: Socially Isolated (04/03/2022)   Social Connection and Isolation Panel    Frequency of Communication with Friends and Family: Once a week    Frequency of Social Gatherings with Friends and Family: Never    Attends Religious Services: Never    Database administrator or Organizations: No    Attends Banker Meetings: Not on file    Marital Status: Never married  Intimate Partner Violence: Not on file           [place], MD 01/06/24 [phone]

## 2024-01-08 ENCOUNTER — Ambulatory Visit (HOSPITAL_COMMUNITY): Payer: Self-pay

## 2024-01-29 ENCOUNTER — Emergency Department (HOSPITAL_COMMUNITY): Payer: MEDICAID

## 2024-01-29 ENCOUNTER — Other Ambulatory Visit: Payer: Self-pay

## 2024-01-29 ENCOUNTER — Encounter (HOSPITAL_COMMUNITY): Payer: Self-pay | Admitting: *Deleted

## 2024-01-29 ENCOUNTER — Emergency Department (HOSPITAL_COMMUNITY)
Admission: EM | Admit: 2024-01-29 | Discharge: 2024-01-30 | Disposition: A | Discharge status: Home / Self Care | Payer: MEDICAID | Attending: Emergency Medicine | Admitting: Emergency Medicine

## 2024-01-29 DIAGNOSIS — R1031 Right lower quadrant pain: Secondary | ICD-10-CM | POA: Diagnosis present

## 2024-01-29 DIAGNOSIS — N83201 Unspecified ovarian cyst, right side: Secondary | ICD-10-CM | POA: Insufficient documentation

## 2024-01-29 LAB — CBC WITH DIFFERENTIAL/PLATELET
Abs Immature Granulocytes: 0.03 K/uL (ref 0.00–0.07)
Basophils Absolute: 0.1 K/uL (ref 0.0–0.1)
Basophils Relative: 1 %
Eosinophils Absolute: 0.3 K/uL (ref 0.0–0.5)
Eosinophils Relative: 3 %
HCT: 34.9 % — ABNORMAL LOW (ref 36.0–46.0)
Hemoglobin: 11.1 g/dL — ABNORMAL LOW (ref 12.0–15.0)
Immature Granulocytes: 0 %
Lymphocytes Relative: 33 %
Lymphs Abs: 3.2 K/uL (ref 0.7–4.0)
MCH: 27 pg (ref 26.0–34.0)
MCHC: 31.8 g/dL (ref 30.0–36.0)
MCV: 84.9 fL (ref 80.0–100.0)
Monocytes Absolute: 0.6 K/uL (ref 0.1–1.0)
Monocytes Relative: 6 %
Neutro Abs: 5.5 K/uL (ref 1.7–7.7)
Neutrophils Relative %: 57 %
Platelets: 336 K/uL (ref 150–400)
RBC: 4.11 MIL/uL (ref 3.87–5.11)
RDW: 15.1 % (ref 11.5–15.5)
WBC: 9.7 K/uL (ref 4.0–10.5)
nRBC: 0 % (ref 0.0–0.2)

## 2024-01-29 LAB — COMPREHENSIVE METABOLIC PANEL WITH GFR
ALT: 12 U/L (ref 0–44)
AST: 15 U/L (ref 15–41)
Albumin: 3.3 g/dL — ABNORMAL LOW (ref 3.5–5.0)
Alkaline Phosphatase: 67 U/L (ref 38–126)
Anion gap: 11 (ref 5–15)
BUN: 12 mg/dL (ref 6–20)
CO2: 18 mmol/L — ABNORMAL LOW (ref 22–32)
Calcium: 8.6 mg/dL — ABNORMAL LOW (ref 8.9–10.3)
Chloride: 107 mmol/L (ref 98–111)
Creatinine, Ser: 0.7 mg/dL (ref 0.44–1.00)
GFR, Estimated: >60 mL/min (ref >60)
Glucose, Bld: 104 mg/dL — ABNORMAL HIGH (ref 70–99)
Potassium: 4.1 mmol/L (ref 3.5–5.1)
Sodium: 136 mmol/L (ref 135–145)
Total Bilirubin: 0.3 mg/dL (ref 0.0–1.2)
Total Protein: 6.6 g/dL (ref 6.5–8.1)

## 2024-01-29 LAB — URINALYSIS, ROUTINE W REFLEX MICROSCOPIC
Bilirubin Urine: NEGATIVE
Glucose, UA: NEGATIVE mg/dL
Hgb urine dipstick: NEGATIVE
Ketones, ur: NEGATIVE mg/dL
Leukocytes,Ua: NEGATIVE
Nitrite: NEGATIVE
Protein, ur: NEGATIVE mg/dL
Specific Gravity, Urine: 1.025 (ref 1.005–1.030)
pH: 6 (ref 5.0–8.0)

## 2024-01-29 LAB — HCG, SERUM, QUALITATIVE: Preg, Serum: NEGATIVE

## 2024-01-29 LAB — LIPASE, BLOOD: Lipase: 26 U/L (ref 11–51)

## 2024-01-29 MED ORDER — IOHEXOL 350 MG/ML SOLN
75.0000 mL | Freq: Once | INTRAVENOUS | Status: AC | PRN
  Administered 2024-01-29: 75 mL via INTRAVENOUS

## 2024-01-29 NOTE — ED Provider Triage Note (Signed)
 Emergency Medicine Provider Triage Evaluation Note  Erin Jacobson , a 33 y.o. female  was evaluated in triage.  Pt complains of right lower quadrant abdominal pain.  She has had it progressively worsening over the last week, LMP 1022.  No history of the same.  Radiates to her back, pain is constant and progressively worsening she has a history of urinary symptoms but denies any fevers or chills denies any vaginal symptoms no nausea or vomiting..  Review of Systems  Positive: Right lower quadrant abdominal pain Negative: Fever  Physical Exam  BP (!) 153/92   Pulse 93   Temp 98.2 F (36.8 C)   Resp 18   Ht 5' 1 (1.549 m)   Wt 65.8 kg   LMP 01/20/2024 (Exact Date)   SpO2 100%   BMI 27.41 kg/m  Gen:   Awake, no distress   Resp:  Normal effort  MSK:   Moves extremities without difficulty  Other:    Medical Decision Making  Medically screening exam initiated at 7:28 PM.  Appropriate orders placed.  [name] was informed that the remainder of the evaluation will be completed by another provider, this initial triage assessment does not replace that evaluation, and the importance of remaining in the ED until their evaluation is complete.     Arloa Chroman, PA-C 01/29/24 1929

## 2024-01-29 NOTE — ED Triage Notes (Signed)
 Patient here with complaints of back pain, abdominal pain, right sided body pain that started abut 2 weeks. The past 2 days its progressively gotten worse. Patient reports she has had light headedness and chest pain. She reports chest pain is in the center and radiates into the right side of her stomach.

## 2024-01-29 NOTE — ED Triage Notes (Signed)
 Rt groin and flank pain for 2 weeks and she is having chest pain  lmp  October 22 nd

## 2024-01-30 MED ORDER — NAPROXEN 500 MG PO TABS: 500.0000 mg | ORAL_TABLET | Freq: Two times a day (BID) | ORAL | 0 refills | Status: AC

## 2024-01-30 NOTE — ED Provider Notes (Signed)
 [place] [facility] AT [facility] Provider Note   CSN: [id number] Arrival date & time: 01/29/24  1643     Patient presents with: No chief complaint on file.   Erin Jacobson is a 33 y.o. female.   The history is provided by the patient and medical records.   33 year old female with hx of anxiety, depression, presenting to the ED for abdominal pain.  Patient reports pain in right lower abdomen/pelvic area.  Pain ongoing x2 weeks, initially was intermittent but more constant over the past 2 days.  She denies any associated nausea, vomit, diarrhea.  States pain is combination of sharp and dull at the same time.  She denies any vaginal discharge.  Has some chronic urinary issues since last childbirth, this is unchanged.  Denies any fever or chills.  No prior history of ovarian cysts or other GYN issues.  Prior to Admission medications   Medication Sig Start Date End Date Taking? Authorizing Provider  naproxen  (NAPROSYN ) 500 MG tablet Take 1 tablet (500 mg total) by mouth 2 (two) times daily. 01/30/24  Yes Jarold Olam CHRISTELLA, PA-C    Allergies: Amoxicillin     Review of Systems  Gastrointestinal:  Positive for abdominal pain.  All other systems reviewed and are negative.   Updated Vital Signs BP 124/80 (BP Location: Right Arm)   Pulse 72   Temp 97.9 F (36.6 C) (Oral)   Resp 16   Ht 5' 1 (1.549 m)   Wt 65.8 kg   LMP 01/20/2024 (Exact Date)   SpO2 100%   BMI 27.41 kg/m   Physical Exam Vitals and nursing note reviewed.  Constitutional:      Appearance: She is well-developed.  HENT:     Head: Normocephalic and atraumatic.  Eyes:     Conjunctiva/sclera: Conjunctivae normal.     Pupils: Pupils are equal, round, and reactive to light.  Cardiovascular:     Rate and Rhythm: Normal rate and regular rhythm.     Heart sounds: Normal heart sounds.  Pulmonary:     Effort: Pulmonary effort is normal.     Breath sounds: Normal breath sounds.  Abdominal:      General: Bowel sounds are normal.     Palpations: Abdomen is soft.     Tenderness: There is no abdominal tenderness.     Comments: Soft, no real elicited tenderness, no CVA tenderness, no peritoneal signs  Musculoskeletal:        General: Normal range of motion.     Cervical back: Normal range of motion.  Skin:    General: Skin is warm and dry.  Neurological:     Mental Status: She is alert and oriented to person, place, and time.     (all labs ordered are listed, but only abnormal results are displayed) Labs Reviewed  CBC WITH DIFFERENTIAL/PLATELET - Abnormal; Notable for the following components:      Result Value   Hemoglobin 11.1 (*)    HCT 34.9 (*)    All other components within normal limits  COMPREHENSIVE METABOLIC PANEL WITH GFR - Abnormal; Notable for the following components:   CO2 18 (*)    Glucose, Bld 104 (*)    Calcium  8.6 (*)    Albumin 3.3 (*)    All other components within normal limits  URINALYSIS, ROUTINE W REFLEX MICROSCOPIC - Abnormal; Notable for the following components:   APPearance HAZY (*)    All other components within normal limits  LIPASE, BLOOD  HCG,  SERUM, QUALITATIVE    EKG: None  Radiology: CT ABDOMEN PELVIS W CONTRAST Result Date: 01/29/2024 EXAM: CT ABDOMEN AND PELVIS WITH CONTRAST 01/29/2024 08:46:47 PM TECHNIQUE: CT of the abdomen and pelvis was performed with the administration of 75 mL of iohexol  (OMNIPAQUE ) 350 MG/ML injection. Multiplanar reformatted images are provided for review. Automated exposure control, iterative reconstruction, and/or weight-based adjustment of the mA/kV was utilized to reduce the radiation dose to as low as reasonably achievable. COMPARISON: Comparison study 01/21/2014. CLINICAL HISTORY: RLQ abdominal pain. FINDINGS: LOWER CHEST: Lung bases are clear. LIVER: The liver is unremarkable. GALLBLADDER AND BILE DUCTS: Gallbladder is unremarkable. No biliary ductal dilatation. SPLEEN: No acute abnormality. PANCREAS: No  acute abnormality. ADRENAL GLANDS: No acute abnormality. KIDNEYS, URETERS AND BLADDER: The kidneys demonstrate a normal enhancement pattern. No renal calculi or obstructive changes are seen. The bladder is decompressed. GI AND BOWEL: Stomach demonstrates no acute abnormality. Small bowel is within normal limits. Scattered diverticular changes of the colon are noted. No diverticulitis is seen. The appendix has been surgically removed. PERITONEUM AND RETROPERITONEUM: No ascites. No free air. VASCULATURE: Aorta is normal in caliber. LYMPH NODES: No lymphadenopathy. REPRODUCTIVE ORGANS: The uterus is within normal limits. A dominant cyst is noted within the right ovary measuring up to 4.7 cm. BONES AND SOFT TISSUES: No acute osseous abnormality. No focal soft tissue abnormality. IMPRESSION: 1. No acute findings in the abdomen or pelvis. 2. Simple-appearing right ovarian cyst measuring up to 4.7 cm; no follow-up imaging recommended if premenopausal. If postmenopausal, recommend ultrasound follow-up in 612 months or prompt ultrasound if assessment is limited. Electronically signed by: Oneil Devonshire MD 01/29/2024 08:56 PM EST RP Workstation: [id number]     Procedures   Medications Ordered in the ED  iohexol  (OMNIPAQUE ) 350 MG/ML injection 75 mL (75 mLs Intravenous Contrast Given 01/29/24 2047)                                    Medical Decision Making Amount and/or Complexity of Data Reviewed Radiology: ordered and independent interpretation performed. ECG/medicine tests: ordered and independent interpretation performed.  Risk Prescription drug management.   33 y.o. F here with right lower abdominal pain x2 weeks, more constant x2 days.  No N/V/D.  No fever/chills.  No vaginal discharge.  Afebrile, non-toxic in appearance here.  No real tenderness or peritoneal signs of my exam.  Labs grossly reassuring--no leukocytosis, no electrolyte derangement.  UA without any signs of infection.  CT with right  ovarian cyst, no complicating features.  She has status post appendectomy.  Results discussed with patient.  She does have a GYN but has not had any recent follow-up.  Encouraged her to arrange this, may need repeat ultrasound in 6 to 12 weeks for monitoring.  Can use anti-inflammatories in the meantime.  Can return here for any concerns.  Final diagnoses:  Right ovarian cyst    ED Discharge Orders          Ordered    naproxen  (NAPROSYN ) 500 MG tablet  2 times daily        01/30/24 0510               Jarold Olam HERO, PA-C 01/30/24 0545    Palumbo, April, MD 01/30/24 [phone]

## 2024-01-30 NOTE — Discharge Instructions (Addendum)
 You were found to have a cyst on your right ovary today on CT. Recommend follow-up with OB-GYN for this, usually requires follow-up ultrasound in 6-12 weeks. Return here for new concerns.

## 2024-02-05 MED ORDER — FLUCONAZOLE 150 MG PO TABS: 150.0000 mg | ORAL_TABLET | Freq: Once | ORAL | 0 refills | Status: AC

## 2024-02-05 NOTE — Telephone Encounter (Signed)
 Pt reports she lost diflucan .

## 2024-03-02 ENCOUNTER — Other Ambulatory Visit: Payer: Self-pay

## 2024-03-02 ENCOUNTER — Emergency Department (HOSPITAL_COMMUNITY)
Admission: EM | Admit: 2024-03-02 | Discharge: 2024-03-03 | Disposition: A | Discharge status: Home / Self Care | Payer: MEDICAID | Attending: Emergency Medicine | Admitting: Emergency Medicine

## 2024-03-02 DIAGNOSIS — N8003 Adenomyosis of the uterus: Secondary | ICD-10-CM | POA: Insufficient documentation

## 2024-03-02 DIAGNOSIS — R1031 Right lower quadrant pain: Secondary | ICD-10-CM | POA: Diagnosis present

## 2024-03-02 LAB — URINALYSIS, ROUTINE W REFLEX MICROSCOPIC
Glucose, UA: NEGATIVE mg/dL
Hgb urine dipstick: NEGATIVE
Ketones, ur: 5 mg/dL — AB
Leukocytes,Ua: NEGATIVE
Nitrite: NEGATIVE
Protein, ur: 30 mg/dL — AB
Specific Gravity, Urine: >1.046 — ABNORMAL HIGH (ref 1.005–1.030)
pH: 5 (ref 5.0–8.0)

## 2024-03-02 LAB — CBC
HCT: 35.8 % — ABNORMAL LOW (ref 36.0–46.0)
Hemoglobin: 11.5 g/dL — ABNORMAL LOW (ref 12.0–15.0)
MCH: 26.7 pg (ref 26.0–34.0)
MCHC: 32.1 g/dL (ref 30.0–36.0)
MCV: 83.3 fL (ref 80.0–100.0)
Platelets: 377 K/uL (ref 150–400)
RBC: 4.3 MIL/uL (ref 3.87–5.11)
RDW: 14.6 % (ref 11.5–15.5)
WBC: 10.2 K/uL (ref 4.0–10.5)
nRBC: 0 % (ref 0.0–0.2)

## 2024-03-02 LAB — COMPREHENSIVE METABOLIC PANEL WITH GFR
ALT: 19 U/L (ref 0–44)
AST: 21 U/L (ref 15–41)
Albumin: 4.6 g/dL (ref 3.5–5.0)
Alkaline Phosphatase: 75 U/L (ref 38–126)
Anion gap: 12 (ref 5–15)
BUN: 12 mg/dL (ref 6–20)
CO2: 22 mmol/L (ref 22–32)
Calcium: 9.7 mg/dL (ref 8.9–10.3)
Chloride: 105 mmol/L (ref 98–111)
Creatinine, Ser: 0.71 mg/dL (ref 0.44–1.00)
GFR, Estimated: >60 mL/min (ref >60)
Glucose, Bld: 94 mg/dL (ref 70–99)
Potassium: 3.6 mmol/L (ref 3.5–5.1)
Sodium: 139 mmol/L (ref 135–145)
Total Bilirubin: 0.3 mg/dL (ref 0.0–1.2)
Total Protein: 8.2 g/dL — ABNORMAL HIGH (ref 6.5–8.1)

## 2024-03-02 LAB — HCG, SERUM, QUALITATIVE: Preg, Serum: NEGATIVE

## 2024-03-02 LAB — LIPASE, BLOOD: Lipase: 25 U/L (ref 11–51)

## 2024-03-02 NOTE — ED Triage Notes (Signed)
 Patient states she have an cyst on her right ovary and she is have some severe abdominal pain. Patient states she needs some pain meds she have exceed the limit for Tylenol  today.  Patient states her US  is schedule for January.

## 2024-03-03 ENCOUNTER — Emergency Department (HOSPITAL_COMMUNITY): Payer: MEDICAID

## 2024-03-03 LAB — WET PREP, GENITAL
Sperm: NONE SEEN
Trich, Wet Prep: NONE SEEN
WBC, Wet Prep HPF POC: <10 (ref <10)
Yeast Wet Prep HPF POC: NONE SEEN

## 2024-03-03 LAB — GC/CHLAMYDIA PROBE AMP (~~LOC~~) NOT AT ARMC
Chlamydia: NEGATIVE
Comment: NEGATIVE
Comment: NORMAL
Neisseria Gonorrhea: NEGATIVE

## 2024-03-03 NOTE — ED Provider Notes (Signed)
 [place] [facility] AT [facility] Provider Note   CSN: [id number] Arrival date & time: 03/02/24  2132     Patient presents with: Abdominal Pain   Erin Jacobson is a 33 y.o. female.   The history is provided by the patient and medical records. No language interpreter was used.  Abdominal Pain    33 year old female with history of ovarian cysts, kidney stones, GERD presenting with complaint of abdominal pain.  Patient report acute onset of sharp stabbing pain to the right lower abdomen/pelvic region started earlier today.  States pain is intense, she was having difficulty taking care of her kids because the pain was unbearable prompting this ER visit.  No associate fever chills no nausea vomiting diarrhea constipation no dysuria no hematuria no vaginal bleeding or vaginal discharge.  She mention she has history of ovarian cyst and usually would have pain to the right side in the similar location but this pain is much more intense.  She has had bilateral tubal ligation.  She took Tylenol  earlier today.  Prior to Admission medications   Medication Sig Start Date End Date Taking? Authorizing Provider  naproxen  (NAPROSYN ) 500 MG tablet Take 1 tablet (500 mg total) by mouth 2 (two) times daily. 01/30/24   Jarold Olam CHRISTELLA, PA-C    Allergies: Amoxicillin     Review of Systems  Gastrointestinal:  Positive for abdominal pain.  All other systems reviewed and are negative.   Updated Vital Signs BP 133/73 (BP Location: Right Arm)   Pulse 95   Temp 98.9 F (37.2 C) (Oral)   Resp 16   Ht 5' 1 (1.549 m)   Wt 68 kg   SpO2 99%   BMI 28.34 kg/m   Physical Exam Vitals and nursing note reviewed.  Constitutional:      General: She is not in acute distress.    Appearance: She is well-developed.  HENT:     Head: Atraumatic.  Eyes:     Conjunctiva/sclera: Conjunctivae normal.  Cardiovascular:     Rate and Rhythm: Normal rate and regular rhythm.  Pulmonary:      Effort: Pulmonary effort is normal.  Abdominal:     General: Bowel sounds are normal.     Palpations: Abdomen is soft.     Tenderness: There is abdominal tenderness in the right lower quadrant.     Hernia: No hernia is present.  Musculoskeletal:     Cervical back: Neck supple.  Skin:    Findings: No rash.  Neurological:     Mental Status: She is alert.  Psychiatric:        Mood and Affect: Mood normal.     (all labs ordered are listed, but only abnormal results are displayed) Labs Reviewed  COMPREHENSIVE METABOLIC PANEL WITH GFR - Abnormal; Notable for the following components:      Result Value   Total Protein 8.2 (*)    All other components within normal limits  CBC - Abnormal; Notable for the following components:   Hemoglobin 11.5 (*)    HCT 35.8 (*)    All other components within normal limits  URINALYSIS, ROUTINE W REFLEX MICROSCOPIC - Abnormal; Notable for the following components:   APPearance HAZY (*)    Specific Gravity, Urine >1.046 (*)    Bilirubin Urine SMALL (*)    Ketones, ur 5 (*)    Protein, ur 30 (*)    Bacteria, UA MANY (*)    All other components within normal limits  LIPASE, BLOOD  HCG, SERUM, QUALITATIVE    EKG: None  Radiology: No results found.   Procedures   Medications Ordered in the ED - No data to display                                  Medical Decision Making Amount and/or Complexity of Data Reviewed Labs: ordered. Radiology: ordered.   BP 133/73 (BP Location: Right Arm)   Pulse 95   Temp 98.9 F (37.2 C) (Oral)   Resp 16   Ht 5' 1 (1.549 m)   Wt 68 kg   SpO2 99%   BMI 28.34 kg/m   70:101 AM  33 year old female with history of ovarian cysts, kidney stones, GERD presenting with complaint of abdominal pain.  Patient report acute onset of sharp stabbing pain to the right lower abdomen/pelvic region started earlier today.  States pain is intense, she was having difficulty taking care of her kids because the pain was  unbearable prompting this ER visit.  No associate fever chills no nausea vomiting diarrhea constipation no dysuria no hematuria no vaginal bleeding or vaginal discharge.  She mention she has history of ovarian cyst and usually would have pain to the right side in the similar location but this pain is much more intense.  She has had bilateral tubal ligation.  She took Tylenol  earlier today.  On exam patient overall well-appearing resting elderly in no acute discomfort.  Some tenderness to right lower abdomen on palpation without guarding or rebound tenderness.  Bowel sounds present  -Labs ordered, independently viewed and interpreted by me.  Labs remarkable for wet prep shows evidence of clue cells.  UA shows many bacteria but nitrite negative and negative leukocyte esterase. -The patient was maintained on a cardiac monitor.  I personally viewed and interpreted the cardiac monitored which showed an underlying rhythm of: SR -Imaging independently viewed and interpreted by me and I agree with radiologist's interpretation.  Result remarkable for transvaginal US  showing  adenomyosis with thickened endometrium measuring 18mm.  No evidence of ovarian torsion. -This patient presents to the ED for concern of pelvic pain, this involves an extensive number of treatment options, and is a complaint that carries with it a high risk of complications and morbidity.  The differential diagnosis includes ovarian torsion, TOA, pid, ovarian cyst, appendicitis, cervicitis, uti, kidney stone -Co morbidities that complicate the patient evaluation includes ovarian cyst -Treatment includes supportive care -Reevaluation of the patient after these medicines showed that the patient improved -PCP office notes or outside notes reviewed -Escalation to admission/observation considered: patients feels much better, is comfortable with discharge, and will follow up with OBGYN -Prescription medication considered, patient comfortable with  OTC -Social Determinant of Health considered which includes food insecurity, tobacco use, social isolation      Final diagnoses:  Adenomyosis of uterus    ED Discharge Orders     None          Nivia Colon, PA-C 03/03/24 0340    Palumbo, April, MD 03/03/24 [phone]

## 2024-03-03 NOTE — Discharge Instructions (Signed)
 Adenomyosis is a condition where the tissue that normally lines the uterus (endometrium) grows into the muscular wall of the uterus (myometrium), causing it to thicken, enlarge, and potentially lead to very painful, heavy periods, chronic pelvic pain, and infertility. While its exact cause is unknown, it often resolves after menopause, and treatments range from hormonal therapies to surgery like hysterectomy to manage severe symptoms. }  Symptoms Heavy menstrual bleeding (menorrhagia): and prolonged periods. Severe cramping (dysmenorrhea), often sharp, knifelike, or dull aches. Chronic pelvic pain: and pressure. Pain during sex (dyspareunia) . Anemia and fatigue: from blood loss. Difficulty getting pregnant (infertility): or increased miscarriage risk.  Causes & Risk Factors The exact cause is unknown, but it involves endometrial tissue growing into the uterine muscle.  Risk factors include increasing age, prior uterine surgery, and possibly genetics, often affecting women aged 49-50.  Diagnosis & Treatment Diagnosis: Often suspected clinically and confirmed with imaging like transvaginal ultrasound or MRI, or sometimes after hysterectomy.  Treatment: Hormonal therapies: Birth control pills, IUDs (like Mirena) to manage bleeding and pain.  Pain relievers: NSAIDs for cramps.  Surgery: Hysterectomy (uterus removal) is a cure, while other options may involve removing focal growths (adenomyomas).

## 2024-03-28 ENCOUNTER — Ambulatory Visit: Payer: MEDICAID | Admitting: Obstetrics and Gynecology

## 2024-03-28 ENCOUNTER — Encounter: Payer: Self-pay | Admitting: Obstetrics and Gynecology

## 2024-03-28 ENCOUNTER — Other Ambulatory Visit (HOSPITAL_COMMUNITY)
Admission: RE | Admit: 2024-03-28 | Discharge: 2024-03-28 | Disposition: A | Payer: MEDICAID | Source: Ambulatory Visit | Attending: Obstetrics and Gynecology | Admitting: Obstetrics and Gynecology

## 2024-03-28 ENCOUNTER — Other Ambulatory Visit: Payer: Self-pay

## 2024-03-28 VITALS — BP 123/55 | HR 85 | Ht 61.0 in | Wt 156.6 lb

## 2024-03-28 DIAGNOSIS — R102 Pelvic and perineal pain unspecified side: Secondary | ICD-10-CM | POA: Diagnosis not present

## 2024-03-28 DIAGNOSIS — Z124 Encounter for screening for malignant neoplasm of cervix: Secondary | ICD-10-CM | POA: Insufficient documentation

## 2024-03-28 DIAGNOSIS — G8929 Other chronic pain: Secondary | ICD-10-CM

## 2024-03-28 NOTE — Progress Notes (Signed)
 Obstetrics and Gynecology New Patient Evaluation  Appointment Date: 03/28/2024  OBGYN Clinic: [facility]  Primary Care Provider: Alvera Jacobson  Referring Provider: ED  Chief Complaint: follow up ED  History of Present Illness: Erin Jacobson is a 34 y.o.  [phone] (Patient's last menstrual period was 03/06/2024 (exact date).), seen for the above chief complaint. Her past medical history is significant for h/o 2016 SVT after PIV in pregnancy, h/o c-section x 1, ?adenomyosis on imaging, h/o BTL  Patient went to ED in November for pain and diagnosed with 3-4cm RO cyst on CT and had another visit in December and u/s negative except ?adenomyosis.   Patient states she has a qmonth, regular period that lasts for 8 days and is somewhat painful and heavy. She states her periods got better after c/s in 2017 but over the past year have had pain and pain outside of her periods.    Review of Systems: Pertinent items are noted in HPI.   Patient Active Problem List   Diagnosis Date Noted   MDD (major depressive disorder), recurrent episode, moderate (HCC) 07/16/2022   Pap smear of cervix satisfactory but lacking transformation zone 02/01/2021   History of sexually transmitted disease 04/09/2018   Anxiety and depression 04/09/2018   History of cesarean delivery 03/12/2018   History of deep vein thrombosis (DVT) during pregnancy 03/12/2018    Past Medical History:  Past Medical History:  Diagnosis Date   Complication of anesthesia    Depression    on meds, doing ok   DVT of upper extremity (deep vein thrombosis) (HCC)    GERD (gastroesophageal reflux disease)    no current meds for reflux   Infection    UTI   Kidney stone    stent and foley placed to pass kidney stones 05/05/2014   Missed period 08/04/2022   Needle phobia 08/04/2022   PONV (postoperative nausea and vomiting)    Trichomoniasis    Past Surgical History:  Past Surgical History:   Procedure Laterality Date   APPENDECTOMY  04/01/2004   ex. lap.   CESAREAN SECTION N/A 06/11/2015   Procedure: CESAREAN SECTION;  Surgeon: Erin Gaskins, MD;  Location: [facility];  Service: Obstetrics;  Laterality: N/A;   CYSTOSCOPY WITH RETROGRADE PYELOGRAM, URETEROSCOPY AND STENT PLACEMENT Left 05/05/2014   Procedure: LEFT  URETEROSCOPY,  RETROGRADE PYELOGRAM,  AND STENT PLACEMENT, STONE REMOVAL;  Surgeon: Erin LELON Salines, MD;  Location: [facility];  Service: Urology;  Laterality: Left;   HOLMIUM LASER APPLICATION Left 05/05/2014   Procedure: HOLMIUM LASER APPLICATION;  Surgeon: Erin LELON Salines, MD;  Location: [facility];  Service: Urology;  Laterality: Left;   KNEE SURGERY     LAPAROSCOPIC TUBAL LIGATION Bilateral 09/29/2018   Procedure: LAPAROSCOPIC TUBAL LIGATION WITH FILSHIE CLIPS;  Surgeon: Erin Harland BROCKS, MD;  Location: [facility];  Service: Gynecology;  Laterality: Bilateral;   Past Obstetrical History:  OB History  Gravida Para Term Preterm AB Living  3 3 2 1  3   SAB IAB Ectopic Multiple Live Births     0 3    # Outcome Date GA Lbr Len/2nd Weight Sex Type Anes PTL Lv  3 Preterm 06/14/18 [redacted]w[redacted]d  7 lb 1.8 oz (3.225 kg) M VBAC None  LIV  2 Term 06/11/15 [redacted]w[redacted]d 30:55 / 04:20 6 lb 10.4 oz (3.015 kg) M CS-LTranv EPI  LIV  1 Term 12/14/09 [redacted]w[redacted]d  5 lb 3 oz (2.353 kg) M Vag-Spont   LIV  Past Gynecological History: As per HPI.  Social History:  Social History   Socioeconomic History   Marital status: Single    Spouse name: Not on file   Number of children: Not on file   Years of education: Not on file   Highest education level: 12th grade  Occupational History   Not on file  Tobacco Use   Smoking status: Some Days    Types: Cigars    Last attempt to quit: 09/24/2014    Years since quitting: 9.5   Smokeless tobacco: Never  Vaping Use   Vaping status: Never Used  Substance and Sexual Activity   Alcohol use: Not Currently   Drug use: No   Sexual activity: Yes    Birth  control/protection: None  Other Topics Concern   Not on file  Social History Narrative   Not on file   Social Drivers of Health   Tobacco Use: High Risk (03/28/2024)   Patient History    Smoking Tobacco Use: Some Days    Smokeless Tobacco Use: Never    Passive Exposure: Not on file  Financial Resource Strain: Patient Declined (04/03/2022)   Overall Financial Resource Strain (CARDIA)    Difficulty of Paying Living Expenses: Patient declined  Food Insecurity: Food Insecurity Present (04/03/2022)   Hunger Vital Sign    Worried About Running Out of Food in the Last Year: Never true    Ran Out of Food in the Last Year: Sometimes true  Transportation Needs: No Transportation Needs (04/03/2022)   PRAPARE - Administrator, Civil Service (Medical): No    Lack of Transportation (Non-Medical): No  Physical Activity: Sufficiently Active (04/03/2022)   Exercise Vital Sign    Days of Exercise per Week: 3 days    Minutes of Exercise per Session: 60 min  Stress: Stress Concern Present (04/03/2022)   [employer] of Occupational Health - Occupational Stress Questionnaire    Feeling of Stress : To some extent  Social Connections: Socially Isolated (04/03/2022)   Social Connection and Isolation Panel    Frequency of Communication with Friends and Family: Once a week    Frequency of Social Gatherings with Friends and Family: Never    Attends Religious Services: Never    Database Administrator or Organizations: No    Attends Engineer, Structural: Not on file    Marital Status: Never married  Intimate Partner Violence: Not on file  Depression (PHQ2-9): Medium Risk (04/03/2022)   Depression (PHQ2-9)    PHQ-2 Score: 7  Alcohol Screen: Low Risk (04/03/2022)   Alcohol Screen    Last Alcohol Screening Score (AUDIT): 3  Housing: Low Risk (04/03/2022)   Housing    Last Housing Risk Score: 0  Utilities: Not on file  Health Literacy: Not on file   Family History:  Family History   Problem Relation Age of Onset   Anesthesia problems Mother        post-op N/V, hard to wake up   Arthritis Mother    Cancer Maternal Grandfather     Medications Erin Jacobson had no medications administered during this visit. Current Outpatient Medications  Medication Sig Dispense Refill   naproxen  (NAPROSYN ) 500 MG tablet Take 1 tablet (500 mg total) by mouth 2 (two) times daily. 30 tablet 0   No current facility-administered medications for this visit.   Allergies Amoxicillin   Physical Exam:  BP (!) 123/55   Pulse 85   Ht 5' 1 (1.549 m)  Wt 156 lb 9.6 oz (71 kg)   LMP 03/06/2024 (Exact Date)   BMI 29.59 kg/m  Body mass index is 29.59 kg/m. General appearance: Well nourished, well developed female in no acute distress.  Respiratory:   Normal respiratory effort Abdomen: positive bowel sounds and no masses, hernias; diffusely non tender to palpation, non distended Neuro/Psych:  Normal mood and affect.  Skin:  Warm and dry.  Lymphatic:  No inguinal lymphadenopathy.   Cervical exam performed in the presence of a chaperone Pelvic exam: is not limited by body habitus EGBUS: within normal limits Vagina: within normal limits and with no blood or discharge in the vault Cervix: normal appearing cervix without tenderness, discharge or lesions Uterus:  nonenlarged and non tender Adnexa:  normal adnexa and no mass, fullness, tenderness Rectovaginal: deferred  Laboratory: as per HPI  Radiology:  Narrative & Impression  EXAM: US  Pelvis, Complete Transvaginal and Transabdominal with Doppler 03/03/2024 02:47:15 AM   TECHNIQUE: Transabdominal and transvaginal pelvic duplex ultrasound using B-mode/gray scaled imaging with Doppler spectral analysis and color flow was obtained.   COMPARISON: 04/24/2022. CT 01/29/2024.   CLINICAL HISTORY: Pelvic pain.   FINDINGS:   UTERUS: Uterus measures 8.4 x 5.3 x 6.5 cm. The uterus is retroverted. Heterogeneous echotexture  throughout the myometrium.   ENDOMETRIAL STRIPE: Endometrial measures 18 mm in thickness.   RIGHT OVARY: Right ovary measures 4.5 x 2.4 x 2.7 cm. 1.8 cm dominant follicle. There is normal arterial and venous Doppler flow.   LEFT OVARY: Left ovary measures 3.2 x 1.8 x 1.2 cm. Left ovary is within normal limits. There is normal arterial and venous Doppler flow.   FREE FLUID: No free fluid.   IMPRESSION: 1. Heterogeneous myometrial echotexture which can be seen with adenomyosis. 2. Thickened endometrium measuring 18 mm. 3. No evidence of ovarian torsion.   Electronically signed by: Franky Crease MD 03/03/2024 03:05 AM EST RP Workstation: [id number]   Narrative & Impression  EXAM: CT ABDOMEN AND PELVIS WITH CONTRAST 01/29/2024 08:46:47 PM   TECHNIQUE: CT of the abdomen and pelvis was performed with the administration of 75 mL of iohexol  (OMNIPAQUE ) 350 MG/ML injection. Multiplanar reformatted images are provided for review. Automated exposure control, iterative reconstruction, and/or weight-based adjustment of the mA/kV was utilized to reduce the radiation dose to as low as reasonably achievable.   COMPARISON: Comparison study 01/21/2014.   CLINICAL HISTORY: RLQ abdominal pain.   FINDINGS:   LOWER CHEST: Lung bases are clear.   LIVER: The liver is unremarkable.   GALLBLADDER AND BILE DUCTS: Gallbladder is unremarkable. No biliary ductal dilatation.   SPLEEN: No acute abnormality.   PANCREAS: No acute abnormality.   ADRENAL GLANDS: No acute abnormality.   KIDNEYS, URETERS AND BLADDER: The kidneys demonstrate a normal enhancement pattern. No renal calculi or obstructive changes are seen. The bladder is decompressed.   GI AND BOWEL: Stomach demonstrates no acute abnormality. Small bowel is within normal limits. Scattered diverticular changes of the colon are noted. No diverticulitis is seen. The appendix has been surgically removed.   PERITONEUM AND  RETROPERITONEUM: No ascites. No free air.   VASCULATURE: Aorta is normal in caliber.   LYMPH NODES: No lymphadenopathy.   REPRODUCTIVE ORGANS: The uterus is within normal limits. A dominant cyst is noted within the right ovary measuring up to 4.7 cm.   BONES AND SOFT TISSUES: No acute osseous abnormality. No focal soft tissue abnormality.   IMPRESSION: 1. No acute findings in the abdomen or pelvis. 2. Simple-appearing right  ovarian cyst measuring up to 4.7 cm; no follow-up imaging recommended if premenopausal. If postmenopausal, recommend ultrasound follow-up in 612 months or prompt ultrasound if assessment is limited.   Electronically signed by: Oneil Devonshire MD 01/29/2024 08:56 PM EST RP Workstation: [id number]   Assessment: patient stable  Plan:  1. Cervical cancer screening (Primary) - Cytology - PAP( [facility])  2. Chronic pelvic pain in female D/w her re: options and recommend something to control her periods, such as LNG IUD, POPs or nexplanon given USMEC category II; she is reluctant to try hormones given anx/dep s/s in the past with hormones. In terms of a surgical option, with her likely adenomyosis, I told her that hysterectomy is the only option that would likely provide relief. Pt to consider and follow up next visit, but I told her I would prefer LNG IUD.    Return in about 5 weeks (around 05/02/2024) for 4-6wk f/u with dr. izell, in person, with dr izell.  No future appointments.  Bebe Izell Raddle MD Attending [facility] [employer] [facility])

## 2024-03-31 ENCOUNTER — Other Ambulatory Visit: Payer: Self-pay

## 2024-03-31 ENCOUNTER — Emergency Department (HOSPITAL_COMMUNITY): Payer: MEDICAID

## 2024-03-31 ENCOUNTER — Encounter (HOSPITAL_COMMUNITY): Payer: Self-pay | Admitting: Emergency Medicine

## 2024-03-31 ENCOUNTER — Emergency Department (HOSPITAL_COMMUNITY)
Admission: EM | Admit: 2024-03-31 | Discharge: 2024-03-31 | Disposition: A | Discharge status: Home / Self Care | Payer: MEDICAID | Attending: Emergency Medicine | Admitting: Emergency Medicine

## 2024-03-31 DIAGNOSIS — R1010 Upper abdominal pain, unspecified: Secondary | ICD-10-CM | POA: Diagnosis not present

## 2024-03-31 DIAGNOSIS — R519 Headache, unspecified: Secondary | ICD-10-CM | POA: Insufficient documentation

## 2024-03-31 DIAGNOSIS — R42 Dizziness and giddiness: Secondary | ICD-10-CM | POA: Diagnosis present

## 2024-03-31 DIAGNOSIS — R7309 Other abnormal glucose: Secondary | ICD-10-CM | POA: Diagnosis not present

## 2024-03-31 DIAGNOSIS — D649 Anemia, unspecified: Secondary | ICD-10-CM | POA: Insufficient documentation

## 2024-03-31 DIAGNOSIS — R739 Hyperglycemia, unspecified: Secondary | ICD-10-CM

## 2024-03-31 DIAGNOSIS — K219 Gastro-esophageal reflux disease without esophagitis: Secondary | ICD-10-CM | POA: Diagnosis not present

## 2024-03-31 DIAGNOSIS — R112 Nausea with vomiting, unspecified: Secondary | ICD-10-CM | POA: Insufficient documentation

## 2024-03-31 LAB — URINALYSIS, ROUTINE W REFLEX MICROSCOPIC
Bilirubin Urine: NEGATIVE
Glucose, UA: NEGATIVE mg/dL
Hgb urine dipstick: NEGATIVE
Ketones, ur: 20 mg/dL — AB
Leukocytes,Ua: NEGATIVE
Nitrite: NEGATIVE
Protein, ur: 30 mg/dL — AB
Specific Gravity, Urine: 1.039 — ABNORMAL HIGH (ref 1.005–1.030)
pH: 5 (ref 5.0–8.0)

## 2024-03-31 LAB — COMPREHENSIVE METABOLIC PANEL WITH GFR
ALT: 16 U/L (ref 0–44)
AST: 21 U/L (ref 15–41)
Albumin: 4.5 g/dL (ref 3.5–5.0)
Alkaline Phosphatase: 74 U/L (ref 38–126)
Anion gap: 12 (ref 5–15)
BUN: 10 mg/dL (ref 6–20)
CO2: 20 mmol/L — ABNORMAL LOW (ref 22–32)
Calcium: 9.7 mg/dL (ref 8.9–10.3)
Chloride: 107 mmol/L (ref 98–111)
Creatinine, Ser: 0.73 mg/dL (ref 0.44–1.00)
GFR, Estimated: >60 mL/min
Glucose, Bld: 101 mg/dL — ABNORMAL HIGH (ref 70–99)
Potassium: 4.1 mmol/L (ref 3.5–5.1)
Sodium: 139 mmol/L (ref 135–145)
Total Bilirubin: 0.4 mg/dL (ref 0.0–1.2)
Total Protein: 8.4 g/dL — ABNORMAL HIGH (ref 6.5–8.1)

## 2024-03-31 LAB — CBC
HCT: 36.6 % (ref 36.0–46.0)
Hemoglobin: 11.4 g/dL — ABNORMAL LOW (ref 12.0–15.0)
MCH: 26 pg (ref 26.0–34.0)
MCHC: 31.1 g/dL (ref 30.0–36.0)
MCV: 83.6 fL (ref 80.0–100.0)
Platelets: 402 K/uL — ABNORMAL HIGH (ref 150–400)
RBC: 4.38 MIL/uL (ref 3.87–5.11)
RDW: 14.7 % (ref 11.5–15.5)
WBC: 10.3 K/uL (ref 4.0–10.5)
nRBC: 0 % (ref 0.0–0.2)

## 2024-03-31 LAB — HCG, SERUM, QUALITATIVE: Preg, Serum: NEGATIVE

## 2024-03-31 LAB — LIPASE, BLOOD: Lipase: 21 U/L (ref 11–51)

## 2024-03-31 MED ORDER — MECLIZINE HCL 25 MG PO TABS
25.0000 mg | ORAL_TABLET | Freq: Once | ORAL | Status: AC
  Administered 2024-03-31: 25 mg via ORAL
  Filled 2024-03-31: qty 1

## 2024-03-31 MED ORDER — PROCHLORPERAZINE EDISYLATE 10 MG/2ML IJ SOLN
10.0000 mg | Freq: Once | INTRAMUSCULAR | Status: AC
  Administered 2024-03-31: 10 mg via INTRAVENOUS
  Filled 2024-03-31: qty 2

## 2024-03-31 MED ORDER — LORAZEPAM 1 MG PO TABS
1.0000 mg | ORAL_TABLET | Freq: Once | ORAL | Status: AC
  Administered 2024-03-31: 1 mg via ORAL
  Filled 2024-03-31: qty 1

## 2024-03-31 MED ORDER — SODIUM CHLORIDE 0.9 % IV BOLUS
1000.0000 mL | Freq: Once | INTRAVENOUS | Status: AC
  Administered 2024-03-31: 1000 mL via INTRAVENOUS

## 2024-03-31 MED ORDER — MECLIZINE HCL 25 MG PO TABS: 25.0000 mg | ORAL_TABLET | Freq: Three times a day (TID) | ORAL | 0 refills | Status: AC | PRN

## 2024-03-31 MED ORDER — ONDANSETRON HCL 4 MG PO TABS: 4.0000 mg | ORAL_TABLET | Freq: Four times a day (QID) | ORAL | 0 refills | Status: AC

## 2024-03-31 NOTE — ED Provider Notes (Signed)
 " [place] EMERGENCY DEPARTMENT AT [facility] Provider Note   CSN: [id number] Arrival date & time: 03/31/24  9950     Patient presents with: Abdominal Pain   Erin Jacobson is a 34 y.o. female.   The history is provided by the patient.  Abdominal Pain  She has history of GERD, kidney stone, DVT and comes in complaining of dizziness, nausea and vomiting, abdominal pain.  She states that she was preparing dinner this afternoon when she started having some upper abdominal pain without radiation and some nausea.  She also felt dizzy and felt like she had to sit down.  Since then, she has noted that her symptoms are worse when she is exposed to bright light or loud noises.  She does endorse some mild headache.  She has difficulty describing the dizziness but thinks it is mostly near syncopal feeling.    Prior to Admission medications  Medication Sig Start Date End Date Taking? Authorizing Provider  naproxen  (NAPROSYN ) 500 MG tablet Take 1 tablet (500 mg total) by mouth 2 (two) times daily. 01/30/24   Jarold Olam CHRISTELLA, PA-C    Allergies: Amoxicillin     Review of Systems  Gastrointestinal:  Positive for abdominal pain.  All other systems reviewed and are negative.   Updated Vital Signs BP 128/77 (BP Location: Left Arm)   Pulse 88   Temp 98.1 F (36.7 C) (Oral)   Resp 18   LMP 03/06/2024 (Exact Date)   SpO2 100%   Physical Exam Vitals and nursing note reviewed.   34 year old female, resting comfortably and in no acute distress. Vital signs are normal. Oxygen saturation is 100%, which is normal. Head is normocephalic and atraumatic. PERRLA, EOMI. dizziness is reproduced with rightward gaze and leftward gaze. Neck is nontender and supple. Lungs are clear without rales, wheezes, or rhonchi. Chest is nontender. Heart has regular rate and rhythm without murmur. Abdomen is soft, flat, with mild right upper quadrant tenderness.  There is no rebound or guarding. Extremities  have no cyanosis or edema, full range of motion is present. Skin is warm and dry without rash. Neurologic: Mental status is normal, cranial nerves are intact, moves all extremities equally.  Dizziness is reproduced by passive head movement.  (all labs ordered are listed, but only abnormal results are displayed) Labs Reviewed  COMPREHENSIVE METABOLIC PANEL WITH GFR - Abnormal; Notable for the following components:      Result Value   CO2 20 (*)    Glucose, Bld 101 (*)    Total Protein 8.4 (*)    All other components within normal limits  CBC - Abnormal; Notable for the following components:   Hemoglobin 11.4 (*)    Platelets 402 (*)    All other components within normal limits  URINALYSIS, ROUTINE W REFLEX MICROSCOPIC - Abnormal; Notable for the following components:   APPearance HAZY (*)    Specific Gravity, Urine 1.039 (*)    Ketones, ur 20 (*)    Protein, ur 30 (*)    Bacteria, UA FEW (*)    All other components within normal limits  LIPASE, BLOOD  HCG, SERUM, QUALITATIVE     Radiology: US  Abdomen Limited Result Date: 03/31/2024 CLINICAL DATA:  Right upper quadrant pain. EXAM: ULTRASOUND ABDOMEN LIMITED RIGHT UPPER QUADRANT COMPARISON:  CT scan 01/29/2024 FINDINGS: Gallbladder: No gallstones or gallbladder wall thickening. No pericholecystic fluid. The sonographer reports no sonographic Murphy's sign. Common bile duct: Diameter: 1-2 mm Liver: No focal lesion  identified. Within normal limits in parenchymal echogenicity. Portal vein is patent on color Doppler imaging with normal direction of blood flow towards the liver. Other: None. IMPRESSION: Unremarkable right upper quadrant ultrasound. Electronically Signed   By: Camellia Candle M.D.   On: 03/31/2024 05:13     Procedures   Medications Ordered in the ED  sodium chloride  0.9 % bolus 1,000 mL (0 mLs Intravenous Stopped 03/31/24 0641)  prochlorperazine  (COMPAZINE ) injection 10 mg (10 mg Intravenous Given 03/31/24 0427)  meclizine   (ANTIVERT ) tablet 25 mg (25 mg Oral Given 03/31/24 0427)  LORazepam  (ATIVAN ) tablet 1 mg (1 mg Oral Given 03/31/24 9352)                                    Medical Decision Making Amount and/or Complexity of Data Reviewed Labs: ordered. Radiology: ordered.  Risk Prescription drug management.   Headache, dizziness, nausea, abdominal pain.  She seems to have components suggestive of migraine headache and also components of vertigo.  Abdominal pain with nausea, need to rule out choledocholithiasis.  I have reviewed her past records, and do note abdominal ultrasound on 09/07/2019 showing no gallstones, CT of abdomen and pelvis on 01/29/2024 showing no gallstones.  I have ordered repeat right upper quadrant ultrasound.  I have reviewed her laboratory tests, and my interpretation is borderline elevated random glucose level, stable anemia, borderline thrombocytosis not felt to be clinically significant, urinalysis with ketonuria consistent with vomiting history,, and mild proteinuria which had been present previously.  I have ordered IV fluids, prochlorperazine  for nausea and possible migraine headache, meclizine  for possible vertigo.  Right upper quadrant ultrasound shows no gallstones.  Have independently viewed the images, and agree with radiologist's interpretation.  Patient states no improvement in her dizziness with above-noted treatment.  On exam, she still gets dizzy with lateral gaze.  I am ordering a dose of lorazepam .  If not improving, may need to consider MRI of the brain.  Case is signed out to Dr. Mannie.     Final diagnoses:  Upper abdominal pain  Vertigo  Normochromic normocytic anemia  Elevated random blood glucose level    ED Discharge Orders     None          Raford Lenis, MD 03/31/24 [phone]  "

## 2024-03-31 NOTE — ED Provider Notes (Signed)
" °  Physical Exam  BP 116/72 (BP Location: Left Arm)   Pulse (!) 104   Temp 98.8 F (37.1 C) (Oral)   Resp 16   LMP 03/06/2024 (Exact Date)   SpO2 98%   Physical Exam  Procedures  Procedures  ED Course / MDM    Medical Decision Making Amount and/or Complexity of Data Reviewed Labs: ordered. Radiology: ordered.  Risk Prescription drug management.   34 year old female, came in today for abdominal pain and dizziness.  Blood work overall normal, right upper quadrant ultrasound negative.  Has been able to tolerate p.o.  Feeling quite a bit better, ambulating.  Low suspicion for Posterior CVA, particularly given that the primary reason for patient come to the emergency room was nausea and abdominal pain.  Will discharge.       Mannie Pac T, DO 03/31/24 [phone]  "

## 2024-03-31 NOTE — Discharge Instructions (Signed)
 Your blood work today overall was normal.  You were little bit dehydrated.  Drink more water.  I sent your prescription for meclizine  and Zofran  which can help with vertigo and nausea.  Follow-up with your primary care doctor.  Return to the emergency room for new or worsening symptoms.

## 2024-03-31 NOTE — ED Triage Notes (Signed)
 Patient c/o RUQ abdominal pain x 1 day. Patient report pain radiates to left flank pain. Patient report nausea and vomiting x 2 today. Patient denies dysuria.

## 2024-04-01 LAB — CYTOLOGY - PAP
Comment: NEGATIVE
Diagnosis: NEGATIVE
High risk HPV: NEGATIVE

## 2024-04-11 ENCOUNTER — Ambulatory Visit: Payer: Self-pay | Admitting: Obstetrics and Gynecology

## 2024-05-10 ENCOUNTER — Ambulatory Visit: Payer: Self-pay | Admitting: Obstetrics and Gynecology
